# Patient Record
Sex: Female | Born: 1970 | Race: Black or African American | Hispanic: No | Marital: Single | Smoking: Never smoker
Health system: Southern US, Community
[De-identification: ages and names within clinical notes are randomized; demographics above are authoritative.]

## PROBLEM LIST (undated history)

## (undated) DIAGNOSIS — M069 Rheumatoid arthritis, unspecified: Secondary | ICD-10-CM

## (undated) DIAGNOSIS — N760 Acute vaginitis: Secondary | ICD-10-CM

## (undated) DIAGNOSIS — Z975 Presence of (intrauterine) contraceptive device: Secondary | ICD-10-CM

## (undated) DIAGNOSIS — E669 Obesity, unspecified: Secondary | ICD-10-CM

## (undated) DIAGNOSIS — M1990 Unspecified osteoarthritis, unspecified site: Secondary | ICD-10-CM

## (undated) DIAGNOSIS — B009 Herpesviral infection, unspecified: Secondary | ICD-10-CM

## (undated) DIAGNOSIS — B9689 Other specified bacterial agents as the cause of diseases classified elsewhere: Secondary | ICD-10-CM

## (undated) DIAGNOSIS — K5900 Constipation, unspecified: Secondary | ICD-10-CM

## (undated) DIAGNOSIS — I10 Essential (primary) hypertension: Secondary | ICD-10-CM

## (undated) DIAGNOSIS — A599 Trichomoniasis, unspecified: Secondary | ICD-10-CM

## (undated) DIAGNOSIS — R253 Fasciculation: Secondary | ICD-10-CM

## (undated) HISTORY — DX: Essential (primary) hypertension: I10

## (undated) HISTORY — DX: Fasciculation: R25.3

## (undated) HISTORY — PX: TONSILLECTOMY: SUR1361

## (undated) HISTORY — DX: Unspecified osteoarthritis, unspecified site: M19.90

## (undated) HISTORY — DX: Obesity, unspecified: E66.9

## (undated) HISTORY — DX: Other specified bacterial agents as the cause of diseases classified elsewhere: B96.89

## (undated) HISTORY — DX: Herpesviral infection, unspecified: B00.9

## (undated) HISTORY — PX: CHOLECYSTECTOMY: SHX55

## (undated) HISTORY — DX: Acute vaginitis: N76.0

## (undated) HISTORY — PX: APPENDECTOMY: SHX54

## (undated) HISTORY — DX: Trichomoniasis, unspecified: A59.9

## (undated) HISTORY — PX: HAND SURGERY: SHX662

## (undated) HISTORY — DX: Presence of (intrauterine) contraceptive device: Z97.5

## (undated) HISTORY — DX: Constipation, unspecified: K59.00

## (undated) HISTORY — DX: Rheumatoid arthritis, unspecified: M06.9

---

## 2000-05-21 ENCOUNTER — Emergency Department (HOSPITAL_COMMUNITY): Admission: EM | Admit: 2000-05-21 | Discharge: 2000-05-21 | Payer: Self-pay | Admitting: Emergency Medicine

## 2002-01-03 ENCOUNTER — Emergency Department (HOSPITAL_COMMUNITY): Admission: EM | Admit: 2002-01-03 | Discharge: 2002-01-03 | Payer: Self-pay | Admitting: *Deleted

## 2002-01-03 ENCOUNTER — Encounter: Payer: Self-pay | Admitting: *Deleted

## 2002-07-03 ENCOUNTER — Inpatient Hospital Stay (HOSPITAL_COMMUNITY): Admission: EM | Admit: 2002-07-03 | Discharge: 2002-07-05 | Payer: Self-pay | Admitting: Emergency Medicine

## 2002-07-03 ENCOUNTER — Encounter: Payer: Self-pay | Admitting: Emergency Medicine

## 2002-12-20 ENCOUNTER — Emergency Department (HOSPITAL_COMMUNITY): Admission: EM | Admit: 2002-12-20 | Discharge: 2002-12-21 | Payer: Self-pay | Admitting: Emergency Medicine

## 2003-05-22 ENCOUNTER — Ambulatory Visit (HOSPITAL_COMMUNITY): Admission: RE | Admit: 2003-05-22 | Discharge: 2003-05-22 | Payer: Self-pay | Admitting: Internal Medicine

## 2003-05-22 ENCOUNTER — Encounter: Payer: Self-pay | Admitting: Internal Medicine

## 2003-08-27 ENCOUNTER — Emergency Department (HOSPITAL_COMMUNITY): Admission: AD | Admit: 2003-08-27 | Discharge: 2003-08-27 | Payer: Self-pay | Admitting: Family Medicine

## 2003-10-02 ENCOUNTER — Emergency Department (HOSPITAL_COMMUNITY): Admission: AD | Admit: 2003-10-02 | Discharge: 2003-10-02 | Payer: Self-pay | Admitting: Family Medicine

## 2003-10-09 ENCOUNTER — Emergency Department (HOSPITAL_COMMUNITY): Admission: AD | Admit: 2003-10-09 | Discharge: 2003-10-09 | Payer: Self-pay | Admitting: Family Medicine

## 2005-02-09 ENCOUNTER — Ambulatory Visit (HOSPITAL_COMMUNITY): Admission: RE | Admit: 2005-02-09 | Discharge: 2005-02-09 | Payer: Self-pay | Admitting: Internal Medicine

## 2005-08-11 ENCOUNTER — Ambulatory Visit (HOSPITAL_COMMUNITY): Admission: RE | Admit: 2005-08-11 | Discharge: 2005-08-11 | Payer: Self-pay | Admitting: Internal Medicine

## 2006-03-26 ENCOUNTER — Emergency Department (HOSPITAL_COMMUNITY): Admission: EM | Admit: 2006-03-26 | Discharge: 2006-03-26 | Payer: Self-pay | Admitting: Emergency Medicine

## 2006-03-31 ENCOUNTER — Ambulatory Visit: Payer: Self-pay | Admitting: Orthopedic Surgery

## 2006-04-08 ENCOUNTER — Emergency Department (HOSPITAL_COMMUNITY): Admission: EM | Admit: 2006-04-08 | Discharge: 2006-04-08 | Payer: Self-pay | Admitting: Family Medicine

## 2006-04-29 ENCOUNTER — Emergency Department (HOSPITAL_COMMUNITY): Admission: EM | Admit: 2006-04-29 | Discharge: 2006-04-29 | Payer: Self-pay | Admitting: Emergency Medicine

## 2006-06-03 ENCOUNTER — Ambulatory Visit (HOSPITAL_COMMUNITY): Admission: RE | Admit: 2006-06-03 | Discharge: 2006-06-03 | Payer: Self-pay | Admitting: Obstetrics and Gynecology

## 2006-08-16 ENCOUNTER — Ambulatory Visit (HOSPITAL_COMMUNITY): Admission: RE | Admit: 2006-08-16 | Discharge: 2006-08-16 | Payer: Self-pay | Admitting: Obstetrics and Gynecology

## 2006-08-16 ENCOUNTER — Encounter (INDEPENDENT_AMBULATORY_CARE_PROVIDER_SITE_OTHER): Payer: Self-pay | Admitting: *Deleted

## 2007-02-09 ENCOUNTER — Ambulatory Visit (HOSPITAL_BASED_OUTPATIENT_CLINIC_OR_DEPARTMENT_OTHER): Admission: RE | Admit: 2007-02-09 | Discharge: 2007-02-09 | Payer: Self-pay | Admitting: Orthopedic Surgery

## 2007-08-06 ENCOUNTER — Inpatient Hospital Stay (HOSPITAL_COMMUNITY): Admission: AD | Admit: 2007-08-06 | Discharge: 2007-08-07 | Payer: Self-pay | Admitting: Obstetrics and Gynecology

## 2007-08-17 HISTORY — PX: OTHER SURGICAL HISTORY: SHX169

## 2007-11-28 ENCOUNTER — Inpatient Hospital Stay (HOSPITAL_COMMUNITY): Admission: AD | Admit: 2007-11-28 | Discharge: 2007-12-02 | Payer: Self-pay | Admitting: Obstetrics and Gynecology

## 2008-06-17 ENCOUNTER — Encounter: Admission: RE | Admit: 2008-06-17 | Discharge: 2008-06-17 | Payer: Self-pay | Admitting: Rheumatology

## 2008-10-24 ENCOUNTER — Ambulatory Visit (HOSPITAL_COMMUNITY): Admission: RE | Admit: 2008-10-24 | Discharge: 2008-10-24 | Payer: Self-pay | Admitting: Internal Medicine

## 2009-11-28 ENCOUNTER — Other Ambulatory Visit: Admission: RE | Admit: 2009-11-28 | Discharge: 2009-11-28 | Payer: Self-pay | Admitting: Obstetrics and Gynecology

## 2010-12-13 ENCOUNTER — Encounter: Payer: Self-pay | Admitting: Internal Medicine

## 2011-01-27 ENCOUNTER — Ambulatory Visit (INDEPENDENT_AMBULATORY_CARE_PROVIDER_SITE_OTHER): Payer: Private Health Insurance - Indemnity

## 2011-01-27 ENCOUNTER — Inpatient Hospital Stay (INDEPENDENT_AMBULATORY_CARE_PROVIDER_SITE_OTHER)
Admission: RE | Admit: 2011-01-27 | Discharge: 2011-01-27 | Disposition: A | Discharge status: Home / Self Care | Payer: Private Health Insurance - Indemnity | Source: Ambulatory Visit | Attending: Emergency Medicine | Admitting: Emergency Medicine

## 2011-01-27 DIAGNOSIS — M659 Synovitis and tenosynovitis, unspecified: Secondary | ICD-10-CM

## 2011-04-06 NOTE — Op Note (Signed)
[name], [name]            ACCOUNT NO.:  [id number]   MEDICAL RECORD NO.:  [id number]          PATIENT TYPE:  INP   LOCATION:  9167                          FACILITY:  [facility]   PHYSICIAN:  Janine Limbo, M.D.DATE OF BIRTH:  [birth year]   DATE OF PROCEDURE:  11/30/2007  DATE OF DISCHARGE:                               OPERATIVE REPORT   PREOPERATIVE DIAGNOSES:  1. Postpartum day zero.  2. Postpartum hemorrhage.  3. Anemia (hemoglobin 10.9).   POSTOPERATIVE DIAGNOSES:  1. Postpartum day zero.  2. Postpartum hemorrhage.  3. Anemia (hemoglobin 10.9).   PROCEDURE:  1. Examination under anesthesia.  2. Uterine exploration  3. Uterine curettage.   SURGEON:  Dr. Leonard Schwartz.   FIRST ASSISTANT:  None.   ANESTHETIC:  Epidural.   DISPOSITION:  [name] is a 40 year old female, now para 1-0-1-1, who  had a vaginal delivery of a healthy female infant at approximately 3  o'clock a.m. this morning.  The patient's labor was uneventful.  Her son  weighed 8 pounds.  The Apgars were 7 at one minute and 9 at five  minutes.  There was a three-vessel umbilical cord present.  The placenta  appeared completely normal.  There was no evidence of retained products  of conception.  There was no evidence of lacerations that delivery.  However after delivery, the patient was noted to have a postpartum  hemorrhage.  The estimated blood loss during the third stage of labor  was 600 mL.  The patient was given Pitocin through her IV line.  She was  given 1000 mcg of Cytotec in the rectum.  She was also given a total of  three doses of Hemabate (a total of 750 mcg).  She continued to have a  steady trickle of blood from the vagina.  The decision was made to  explore the uterus to look for unsuspected lacerations and for retained  products of conception.  The patient understands the indications for her  surgical procedure and she accepts the risks of, but not limited to,  anesthetic  complications, bleeding, infection, and possible damage to  the surrounding organs.   FINDINGS:  The vagina was carefully inspected and there were no lower or  upper vaginal lacerations.  The cervix was carefully inspected and there  were no cervical lacerations.  The uterus was explored with a banjo  curette and ring forceps and there was no evidence of retained products  of conception.  There were no abnormal intrauterine masses appreciated.   PROCEDURE:  The patient was placed in a lithotomy position.  We were  then able to determine that the epidural she had received for labor  would be adequate for her anesthesia for this procedure.  The perineum  and vagina were prepped with multiple layers of Betadine.  A Foley  catheter had previously been placed.  A sterile speculum was placed in  the vagina and the vagina was carefully inspected.  No lacerations were  noted.  The cervix was carefully inspected and no lacerations were  noted.  A banjo curette was then used to gently curette  the intrauterine  cavity.  No retained products of conception were appreciated.  No  abnormal intrauterine masses were appreciated.  The cavity was then  explored using ring forceps and again no masses and no retained products  of conception were noted.  The patient was noted to have small to  moderate lochia at that point.  The estimated blood loss for the  procedure was 150 mL.  She tolerated her procedure well.  She was  returned to the supine position.   We will watch the patient carefully for several hours.  She will not eat  or drink anything at this point.  She understands that if she continues  to have a significant amount of bleeding, then we will consider a Bakri  balloon placement.      Janine Limbo, M.D.  Electronically Signed     AVS/MEDQ  D:  11/30/2007  T:  11/30/2007  Job:  [id number]

## 2011-04-06 NOTE — H&P (Signed)
NAMEJOSSILYN, [name]            ACCOUNT NO.:  [id number]   MEDICAL RECORD NO.:  [id number]          PATIENT TYPE:  INP   LOCATION:  9167                          [facility]ACILITY:  [facility]   PHYSICIAN:  Janine Limbo, M.D.DATE OF BIRTH:  January 02, 1971   DATE OF ADMISSION:  11/28/2007  DATE OF DISCHARGE:                              HISTORY & PHYSICAL   HISTORY OF PRESENT ILLNESS:  Ms. [name] is a 40 year old female,  gravida 2, para 0-0-1-0, who presents at 39 weeks and 1 day gestation  for induction of labor.  The patient has been followed at the [facility].  Her pregnancy has been complicated by the fact that she has  rheumatoid arthritis.  She currently takes hydroxychloroquine 200 mg by  mouth twice each day.  The patient also has a history of herpes simplex  virus and she takes Valtrex 500 mg each day.  She denies any signs or  symptoms consistent with a herpes outbreak.  The patient has also been  noted to have proteinuria as measured by her 24-hour urine protein.  Her  platelet count was within normal limits.  Her comprehensive metabolic  panel was within normal limits.   OBSTETRICAL HISTORY:  The patient has had one elective pregnancy  termination.   PAST MEDICAL HISTORY:  The patient has a history of rheumatoid arthritis  as mentioned above.  She has had a hysteroscopy in the past with  resection of submucosal fibroid.  The patient does have a past history  of infertility.  She has taken Clomid.   DRUG ALLERGIES:  The patient is not allergic to any medications,  although she does have allergies to CERTAIN DYES and also an allergy to  POLLEN.   SOCIAL HISTORY:  The patient denies cigarette use, alcohol use, and  recreational drug use.   REVIEW OF SYSTEMS:  Normal pregnancy complaints.   FAMILY HISTORY:  Noncontributory.   PHYSICAL EXAM:  The patient is afebrile.  Her vital signs are stable.  Her  weight is approximately 238 pounds.  HEENT: Is within normal limits.  CHEST  Is clear.  HEART:  Regular rate and rhythm.  BREASTS:  Are without masses.  ABDOMEN:  Is gravid with a fundal height of approximately 37 cm.  EXTREMITIES:  Are grossly normal.  NEUROLOGIC EXAM:  Is grossly normal.  PELVIC EXAM:  Cervix was 1-2 cm dilated, 50% effaced, and -3 station  upon admission.  On careful speculum exam, there were no signs or  lesions consistent with herpes virus.   LABORATORY VALUES:  Blood type is O+, antibody screen negative, VDRL  nonreactive, rubella immune, HBsAg negative,  HIV nonreactive, GC  negative, chlamydia negative.  Pap within normal limits.  First  trimester screen is within normal limits.  Amniocentesis declined.   ASSESSMENT:  1. 39 weeks and 1 day gestation.  2. Rheumatoid arthritis.  3. Proteinuria.  4. Herpes simplex virus with no evidence of an outbreak (currently on      Valtrex).  5. History of infertility with conception on Clomid.  PLAN:  The patient will have her labor induced.  We will begin with  Cervidil on the night of admission and then Pitocin on the following  morning.  The patient understands the indications for her procedure and  she understands that there is an increased risk of cesarean delivery  associated with induction of labor.      Janine Limbo, M.D.  Electronically Signed     AVS/MEDQ  D:  11/29/2007  T:  11/29/2007  Job:  [id number]

## 2011-04-09 NOTE — Op Note (Signed)
[name], [name]            ACCOUNT NO.:  [id number]   MEDICAL RECORD NO.:  [id number]          PATIENT TYPE:  AMB   LOCATION:  SDC                           FACILITY:  [facility]   PHYSICIAN:  Janine Limbo, M.D.DATE OF BIRTH:  Jul 17, 1971   DATE OF PROCEDURE:  08/16/2006  DATE OF DISCHARGE:  08/16/2006                                 OPERATIVE REPORT   PREOPERATIVE DIAGNOSES:  1. Irregular bleeding.  2. Submucosal fibroids.   POSTOPERATIVE DIAGNOSES:  1. Irregular bleeding.  2. Endometrial polyp.   PROCEDURE:  1. Hysteroscopy with resection.  2. Dilatation and curettage.   SURGEON:  Dr. Leonard Schwartz   FIRST ASSISTANT:  None.   ANESTHETIC:  General.   DISPOSITION:  Kendra [name] is a 40 year old female who presents with the  above-mentioned diagnosis.  She has a history of infertility.  Hysterosalpingogram showed patent fallopian tubes.  She was found to have  what was thought to be a submucosal fibroid.  The patient understands the  indications for her surgical procedure and she accepts the risks of, but not  limited to, anesthetic complications, bleeding, infection, and possible  damage to surrounding organs.   FINDINGS:  The uterus was normal size, shape and consistency.  No adnexal  masses were appreciated.  On hysteroscopy, the uterus was sounded to 8 cm.  The patient was found to have a 1 cm endometrial polyp.  No submucosal  fibroids were noted.  There was no evidence of other pathology present.   PROCEDURE:  The patient was taken to the operating room where general  anesthetic was given.  The patient's abdomen, perineum, and vagina were  prepped with multiple layers of Hibiclens.  The bladder was drained of  urine.  The patient was then sterilely draped.  Examination under anesthesia  was then performed.  A paracervical block was placed using 10 mL of half  percent Marcaine with epinephrine.  An additional 10 mL of half percent  Marcaine with  epinephrine were injected directly into the cervix.  The  uterus sounded to 8 cm.  The cervix was gently dilated.  The diagnostic  hysteroscope was inserted and the cavity was carefully inspected.  Pictures  were taken.  Findings were as mentioned above.  The diagnostic hysteroscope  was then removed and the cervix was dilated further.  The operative  hysteroscope was then inserted.  Using a single loop, we then resected the  polyp that was mentioned previously.  The cavity was inspected and no other  polyps were noted and no other fibroids were noted.  We then gently curetted  the uterus so that we removed all tissue fragments.  The hysteroscope was  inserted once again and the cavity was irrigated.  The cavity was felt to be  clean at the end of our procedure.  The patient tolerated her procedure  well.  Hemostasis was noted to be adequate.  All instruments were removed.  Repeat examination showed the uterus to be firm.  Sponge, needle, instrument  counts were correct on two occasions.  The estimated blood loss for the  procedure was  10 mL.  The estimated fluid deficit for the procedure was 50  mL of sorbitol.  The patient was awakened without difficulty and then taken  to the recovery room in stable condition.  She tolerated her procedure well.  The endometrial curettings and the endometrial resection specimen were sent  to pathology for evaluation.   FOLLOW-UP INSTRUCTIONS:  The patient will take ibuprofen 600 mg every 6  hours as needed for mild to moderate pain.  She will take Vicodin 1 or 2  tablets every 4 hours as needed for severe pain.  She will return to see Dr.  Stefano [name] in 2-3 weeks for follow-up examination.  She was given a copy of  the postoperative instruction sheet as prepared by the [facility]  [facility] for patients who have undergone a dilatation and curettage.      Janine Limbo, M.D.  Electronically Signed     AVS/MEDQ  D:  08/16/2006  T:   08/18/2006  Job:  [id number]

## 2011-04-09 NOTE — Discharge Summary (Signed)
[name], [name]                        ACCOUNT NO.:  [id number]   MEDICAL RECORD NO.:  [id number]                   PATIENT TYPE:  INP   LOCATION:  A332                                 FACILITY:  [facility]   PHYSICIAN:  Gracelyn Nurse, M.D.              DATE OF BIRTH:  [birth year]   DATE OF ADMISSION:  07/03/2002  DATE OF DISCHARGE:  07/05/2002                                 DISCHARGE SUMMARY   DISCHARGE DIAGNOSES:  1. Sepsis.     a. With hypotension and fever.  2. Urinary tract infection.  3. Headache.  4. Status post cholecystectomy.  5. Status post appendectomy.  6. Chronic constipation.   DISCHARGE MEDICATIONS:  1. Levaquin 500 mg q.d. x7 days.  2. MiraLax 30 g q.d.   ADMITTING HISTORY AND PHYSICAL:  The patient is a 40 year old black female  who presents to the ED with chills and fever and also hypotensive.  She was  seen in an Urgent Care Center yesterday, diagnosed with UTI and given  Bactrim, she has only taken one dose so far.  She became acutely worse  overnight with fever, chills, and weakness.   HOSPITAL COURSE:  1. Sepsis.  Upon arrival to the ED, the patient had a fever of 102.6, blood     pressure 76/57.  She was fluid resuscitated in the ER and blood pressure     rose to systolic over 100, tachycardia subsided some.  She was treated     with antibiotics for her urinary tract infection and sepsis seemed     resolved.  2. Urinary tract infection.  She did have white blood cells in her urine and     nitrite positive.  Cultures did not grow out anything so far.  She was     started on IV Rocephin.  Day of discharge, she was changed to p.o.     Levaquin and will finish a 7-day course of the p.o. Levaquin.  She is to     follow up with her primary care physician.  3. Chronic constipation.  This has been an ongoing problem for her.  She     takes a daily laxative.  She is being seen by Dr. Matthias Hughs, a     gastroenterologist in [place] for workup of this  and will continue to     see him.   DISCHARGE LABORATORIES:  White blood cells 5.3, hemoglobin 11.9, platelets  219.  Sodium 140, potassium 3.9, chloride 111, CO2 25, BUN 2, creatinine  0.7, glucose 96.    DISPOSITION:  The patient is discharged in stable condition.  She wants to  establish Dr. Felecia Shelling as her new primary care physician and I have called the  office and scheduled an appointment for July 16, 2002 for followup.  Gracelyn Nurse, M.D.    JDJ/MEDQ  D:  07/05/2002  T:  07/09/2002  Job:  [id number]   cc:   Tesfaye D. Felecia Shelling, M.D.  Fax [phone]

## 2011-04-09 NOTE — Op Note (Signed)
NAMEABIGAELLE, [name]            ACCOUNT NO.:  [id number]   MEDICAL RECORD NO.:  [id number]          PATIENT TYPE:  AMB   LOCATION:  DSC                          FACILITY:  [facility]   PHYSICIAN:  Katy Fitch. Sypher, M.D. 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   OPERATIONS:  Release of left first dorsal compartment.   OPERATIONS:  Katy Fitch. Sypher, M.D.   ASSISTANT:  Molly Maduro Dasnoit PA-C.   ANESTHESIA:  General by LMA.   SUPERVISING ANESTHESIOLOGIST:  Sheldon Silvan, M.D.   INDICATIONS:  [name] is a 35-year woman referred by Dr. Felecia Shelling  for evaluation and management of a painful left wrist.  Clinical  examination 6 months prior revealed signs of stenosing tenosynovitis of  the left first dorsal compartment.   She has not responded to injections, splinting and activity  modification.  Due to chronic pain, she is brought to the operating room  at this time for release of her left first dorsal compartment.   PROCEDURE:  [name] is brought to the operating room and placed  in the supine position upon the operating table.   Following induction of general anesthesia by LMA technique, the left arm  was prepped with Betadine soap and solution and  sterilely draped.  A  pneumatic tourniquet was applied to the proximal left brachium.   Following exsanguination of the left arm with an Esmarch bandage, the  arterial tourniquet was inflated to 220 mmHg.  The procedure commenced  with a short transverse incision directly over the palpably thickened  first dorsal compartment.   Subcutaneous tissue were carefully divided, taking care to identify the  cephalic vein and radial sensory branches.  These were  gently retracted,  exposing the first dorsal compartment.  The compartment was cleared of  inflammatory tissues with the [facility] followed by placement of blunt  retractors.  The compartment was then split with a scalpel and scissors.  A significant septum was identified between the extensor pollicis brevis  and abductor pollicis longus tendons.  This was resected with sharp  dissection and use of a rongeur.   Thereafter free range of motion of the wrist was recovered.  The wound  was then repaired with intradermal 3-0 Prolene suture.  A Steri-Strip  was applied followed by sterile gauze and an Ace wrap.  There were no  apparent complications.      Katy Fitch Sypher, M.D.  Electronically Signed     RVS/MEDQ  D:  02/09/2007  T:  02/09/2007  Job:  [id number]

## 2011-04-09 NOTE — H&P (Signed)
[name], [name]            ACCOUNT NO.:  [id number]   MEDICAL RECORD NO.:  [id number]          PATIENT TYPE:  AMB   LOCATION:  SDC                           FACILITY:  [facility]   PHYSICIAN:  Janine Limbo, M.D.DATE OF BIRTH:  05/22/1971   DATE OF ADMISSION:  08/16/2006  DATE OF DISCHARGE:                                HISTORY & PHYSICAL   HISTORY OF PRESENT ILLNESS:  Ms. Childers is a 40 year old female, para 0-0-1-  0, who presents for hysteroscopy with dilatation and curettage and resection  of a submucosal fibroid.  The patient has a history of infertility.  She had  a hysterosalpingogram performed that showed patent fallopian tubes  bilaterally.  She was also found to have two submucosal fibroids.   OBSTETRICAL HISTORY:  The patient has had one first trimester pregnancy  termination.  The patient has a history of sexually transmitted diseases in  the past.   PAST MEDICAL HISTORY:  The patient has a history of high blood pressure.  She has had her wisdom teeth removed, appendix removed, tonsils removed, and  gallbladder removed.  She has had surgery on her hand.   ALLERGIES:  IV DYE.   SOCIAL HISTORY:  The patient denies cigarette use and recreational drug use  other than social alcohol.   REVIEW OF SYSTEMS:  Noncontributory.   FAMILY HISTORY:  Noncontributory.   PHYSICAL EXAMINATION:  VITAL SIGNS:  Weight 208 pounds.  HEENT:  Within normal limits.  CHEST:  Clear.  HEART:  Regular rate and rhythm.  BREASTS:  Without masses.  ABDOMEN:  Nontender.  EXTREMITIES:  Grossly normal.  NEUROLOGICAL:  Grossly normal.  PELVIC EXAM:  External genitalia is normal.  The vagina is normal.  The  cervix is nontender.  The uterus is normal size, shape, and consistency.  Adnexa no masses.  Rectovaginal exam confirms.   ASSESSMENT:  1. Submucosal fibroids.  2. Irregular cycles.  3. Infertility.   PLAN:  The patient will undergo hysteroscopy with resection of the  submucosal  fibroid.  The patient understands the indications for her  surgical procedure and she accepts the risks of, but not limited to,  anesthetic complications, bleeding, infection, and possible damage to  surrounding organs.      Janine Limbo, M.D.  Electronically Signed     AVS/MEDQ  D:  08/15/2006  T:  08/16/2006  Job:  [id number]

## 2011-08-12 LAB — DIFFERENTIAL
Basophils Absolute: 0
Eosinophils Absolute: 0
Lymphocytes Relative: 3 — ABNORMAL LOW
Lymphs Abs: 0.7
Monocytes Absolute: 1.7 — ABNORMAL HIGH
Monocytes Relative: 7
Neutrophils Relative %: 90 — ABNORMAL HIGH

## 2011-08-12 LAB — PROTIME-INR: INR: 1.1

## 2011-08-12 LAB — CBC
HCT: 22.4 — ABNORMAL LOW
Hemoglobin: 13.9
Hemoglobin: 7.6 — CL
Hemoglobin: 9.4 — ABNORMAL LOW
MCHC: 34.8
MCV: 95.3
Platelets: 187
Platelets: 212
RBC: 2.35 — ABNORMAL LOW
RBC: 2.89 — ABNORMAL LOW
RBC: 3.36 — ABNORMAL LOW
RBC: 4.36
RDW: 14.4
RDW: 15.1
WBC: 12.1 — ABNORMAL HIGH
WBC: 19.9 — ABNORMAL HIGH
WBC: 24.9 — ABNORMAL HIGH

## 2011-08-12 LAB — TYPE AND SCREEN
ABO/RH(D): O POS
Antibody Screen: NEGATIVE

## 2011-08-12 LAB — APTT: aPTT: 25

## 2011-08-12 LAB — ABO/RH: ABO/RH(D): O POS

## 2011-08-12 LAB — TECHNOLOGIST SMEAR REVIEW: Path Review: NONE SEEN

## 2011-08-12 LAB — RPR: RPR Ser Ql: NONREACTIVE

## 2011-09-03 LAB — URINE MICROSCOPIC-ADD ON

## 2011-09-03 LAB — GC/CHLAMYDIA PROBE AMP, GENITAL
Chlamydia, DNA Probe: NEGATIVE
GC Probe Amp, Genital: NEGATIVE

## 2011-09-03 LAB — URINALYSIS, ROUTINE W REFLEX MICROSCOPIC: Nitrite: NEGATIVE

## 2011-10-20 ENCOUNTER — Other Ambulatory Visit: Payer: Self-pay | Admitting: Adult Health

## 2011-10-20 ENCOUNTER — Other Ambulatory Visit (HOSPITAL_COMMUNITY)
Admission: RE | Admit: 2011-10-20 | Discharge: 2011-10-20 | Disposition: A | Discharge status: Home / Self Care | Payer: Private Health Insurance - Indemnity | Source: Ambulatory Visit | Attending: Obstetrics and Gynecology | Admitting: Obstetrics and Gynecology

## 2011-10-20 DIAGNOSIS — Z01419 Encounter for gynecological examination (general) (routine) without abnormal findings: Secondary | ICD-10-CM | POA: Insufficient documentation

## 2012-10-19 ENCOUNTER — Emergency Department (HOSPITAL_COMMUNITY)
Admission: EM | Admit: 2012-10-19 | Discharge: 2012-10-19 | Disposition: A | Discharge status: Home / Self Care | Payer: Private Health Insurance - Indemnity | Attending: Emergency Medicine | Admitting: Emergency Medicine

## 2012-10-19 ENCOUNTER — Encounter (HOSPITAL_COMMUNITY): Payer: Self-pay | Admitting: *Deleted

## 2012-10-19 ENCOUNTER — Emergency Department (HOSPITAL_COMMUNITY): Payer: Private Health Insurance - Indemnity

## 2012-10-19 DIAGNOSIS — K5900 Constipation, unspecified: Secondary | ICD-10-CM

## 2012-10-19 DIAGNOSIS — Z8739 Personal history of other diseases of the musculoskeletal system and connective tissue: Secondary | ICD-10-CM | POA: Insufficient documentation

## 2012-10-19 DIAGNOSIS — R109 Unspecified abdominal pain: Secondary | ICD-10-CM

## 2012-10-19 DIAGNOSIS — R1013 Epigastric pain: Secondary | ICD-10-CM | POA: Insufficient documentation

## 2012-10-19 HISTORY — DX: Unspecified osteoarthritis, unspecified site: M19.90

## 2012-10-19 LAB — BASIC METABOLIC PANEL
BUN: 11 mg/dL (ref 6–23)
CO2: 25 mEq/L (ref 19–32)
Calcium: 9.1 mg/dL (ref 8.4–10.5)
Creatinine, Ser: 0.9 mg/dL (ref 0.50–1.10)
Glucose, Bld: 103 mg/dL — ABNORMAL HIGH (ref 70–99)

## 2012-10-19 LAB — URINE MICROSCOPIC-ADD ON

## 2012-10-19 LAB — URINALYSIS, ROUTINE W REFLEX MICROSCOPIC
Glucose, UA: NEGATIVE mg/dL
Ketones, ur: 15 mg/dL — AB
Protein, ur: NEGATIVE mg/dL

## 2012-10-19 LAB — HEPATIC FUNCTION PANEL
ALT: 21 U/L (ref 0–35)
Albumin: 3.7 g/dL (ref 3.5–5.2)
Alkaline Phosphatase: 71 U/L (ref 39–117)
Indirect Bilirubin: 0.4 mg/dL (ref 0.3–0.9)
Total Protein: 7.1 g/dL (ref 6.0–8.3)

## 2012-10-19 LAB — CBC WITH DIFFERENTIAL/PLATELET
Eosinophils Relative: 1 % (ref 0–5)
Lymphocytes Relative: 14 % (ref 12–46)
Lymphs Abs: 1.8 10*3/uL (ref 0.7–4.0)
MCV: 93.8 fL (ref 78.0–100.0)
Platelets: 218 10*3/uL (ref 150–400)
RBC: 4 MIL/uL (ref 3.87–5.11)
WBC: 12.6 10*3/uL — ABNORMAL HIGH (ref 4.0–10.5)

## 2012-10-19 MED ORDER — MAGNESIUM HYDROXIDE 400 MG/5ML PO SUSP
30.0000 mL | Freq: Once | ORAL | Status: AC
  Administered 2012-10-19: 30 mL via ORAL
  Filled 2012-10-19: qty 30

## 2012-10-19 MED ORDER — HYDROMORPHONE HCL PF 1 MG/ML IJ SOLN
1.0000 mg | Freq: Once | INTRAMUSCULAR | Status: AC
  Administered 2012-10-19: 1 mg via INTRAMUSCULAR
  Filled 2012-10-19: qty 1

## 2012-10-19 MED ORDER — ONDANSETRON HCL 4 MG PO TABS
4.0000 mg | ORAL_TABLET | Freq: Once | ORAL | Status: AC
  Administered 2012-10-19: 4 mg via ORAL
  Filled 2012-10-19: qty 1

## 2012-10-19 NOTE — ED Notes (Signed)
Pain upper back and around to abd, for 3-4 days, Increased pain with breathing, and movement.  No known injury,  No NVD.  No cough

## 2012-10-19 NOTE — ED Notes (Signed)
Pt with mid back pain, denies any injury, hurts worse with deep breath

## 2012-10-19 NOTE — ED Provider Notes (Signed)
History     CSN: [id number]  Arrival date & time 10/19/12  1759   First MD Initiated Contact with Patient 10/19/12 1823      Chief Complaint  Patient presents with  . Back Pain    (Consider location/radiation/quality/duration/timing/severity/associated sxs/prior treatment) Patient is a 41 y.o. female presenting with back pain. The history is provided by the patient.  Back Pain  This is a new problem. The problem occurs constantly. The problem has not changed since onset.The pain is associated with no known injury. The pain is present in the thoracic spine. The quality of the pain is described as aching. The pain does not radiate. The pain is moderate. Exacerbated by: taking a deep breath. The pain is the same all the time. Pertinent negatives include no chest pain, no abdominal pain, no bowel incontinence, no perianal numbness, no bladder incontinence and no dysuria. Risk factors include obesity.    Past Medical History  Diagnosis Date  . Arthritis     Past Surgical History  Procedure Date  . Appendectomy   . Cholecystectomy   . Hand surgery   . Tonsillectomy     History reviewed. No pertinent family history.  History  Substance Use Topics  . Smoking status: Never Smoker   . Smokeless tobacco: Not on file  . Alcohol Use: Yes     Comment: 1-2 x/year    OB History    Grav Para Term Preterm Abortions TAB SAB Ect Mult Living                  Review of Systems  Constitutional: Negative for activity change.       All ROS Neg except as noted in HPI  HENT: Negative for nosebleeds and neck pain.   Eyes: Negative for photophobia and discharge.  Respiratory: Negative for cough, shortness of breath and wheezing.   Cardiovascular: Negative for chest pain and palpitations.  Gastrointestinal: Negative for abdominal pain, blood in stool and bowel incontinence.  Genitourinary: Negative for bladder incontinence, dysuria, frequency and hematuria.  Musculoskeletal: Positive for  back pain and arthralgias.  Skin: Negative.   Neurological: Negative for dizziness, seizures and speech difficulty.  Psychiatric/Behavioral: Negative for hallucinations and confusion.    Allergies  Contrast media  Home Medications  No current outpatient prescriptions on file.  BP 121/62  Pulse 96  Temp 98.2 F (36.8 C) (Oral)  Resp 18  Ht 5\' 1"  (1.549 m)  Wt 185 lb (83.915 kg)  BMI 34.96 kg/m2  SpO2 100%  Physical Exam  Nursing note and vitals reviewed. Constitutional: She is oriented to person, place, and time. She appears well-developed and well-nourished.  Non-toxic appearance.  HENT:  Head: Normocephalic.  Right Ear: Tympanic membrane and external ear normal.  Left Ear: Tympanic membrane and external ear normal.  Eyes: EOM and lids are normal. Pupils are equal, round, and reactive to light.  Neck: Normal range of motion. Neck supple. Carotid bruit is not present.  Cardiovascular: Normal rate, regular rhythm, normal heart sounds, intact distal pulses and normal pulses.   Pulmonary/Chest: Breath sounds normal. No respiratory distress.       No chest wall tenderness. Deep breathing causes pain in the rib cage.   Abdominal: Soft. Bowel sounds are normal. There is no tenderness. There is no guarding.       Pressing at the epigastric area causes pain to the back.  Musculoskeletal: Normal range of motion.       Change of position causes pain  from the epigastric area to the back.   Lymphadenopathy:       Head (right side): No submandibular adenopathy present.       Head (left side): No submandibular adenopathy present.    She has no cervical adenopathy.  Neurological: She is alert and oriented to person, place, and time. She has normal strength. No cranial nerve deficit or sensory deficit.  Skin: Skin is warm and dry.  Psychiatric: She has a normal mood and affect. Her speech is normal.    ED Course  Procedures (including critical care time)  Labs Reviewed - No data to  display No results found.   No diagnosis found.    MDM  I have reviewed nursing notes, vital signs, and all appropriate lab and imaging results for this patient. Patient reports waking up to a pain in the mid back that radiates from the abdomen to the mid back. The pain is worse when she takes a deep breath. She has not had an injury or trauma reported. There's been no nausea or vomiting. The differential diagnosis at this time includes deep muscle strain, cholecystitis (cholecystectomy in the past), constipation, renal colic, pancreatitis, and ulcer. Well's criteria 0, low risk of PE.  Hepatic panel wnl, basic metabolic panel reveals glucose 103, o/w wnl. Lipase wnl, UA wnl. CBC- wbc 12.6, o/w wnl. Acute Abd reveals gas and stool throughout the colon extending to the distal rectum. No acute changes or cardiopulmonary disease noted. Findings and plan for increased fluids and fiber and use of miralax discussed with the patient. She acknowledges understanding of the instructions.     Kathie Dike, [place] 10/19/12 2017

## 2012-10-20 NOTE — ED Provider Notes (Signed)
Medical screening examination/treatment/procedure(s) were performed by non-physician practitioner and as supervising physician I was immediately available for consultation/collaboration. Devoria Albe, MD, Armando Gang   Ward Givens, MD 10/20/12 [phone]

## 2012-10-31 ENCOUNTER — Other Ambulatory Visit (HOSPITAL_COMMUNITY)
Admission: RE | Admit: 2012-10-31 | Discharge: 2012-10-31 | Disposition: A | Discharge status: Home / Self Care | Payer: Private Health Insurance - Indemnity | Source: Ambulatory Visit | Attending: Obstetrics and Gynecology | Admitting: Obstetrics and Gynecology

## 2012-10-31 ENCOUNTER — Other Ambulatory Visit: Payer: Self-pay | Admitting: Adult Health

## 2012-10-31 DIAGNOSIS — Z01419 Encounter for gynecological examination (general) (routine) without abnormal findings: Secondary | ICD-10-CM | POA: Insufficient documentation

## 2012-10-31 DIAGNOSIS — Z1151 Encounter for screening for human papillomavirus (HPV): Secondary | ICD-10-CM | POA: Insufficient documentation

## 2012-11-16 ENCOUNTER — Other Ambulatory Visit: Payer: Self-pay | Admitting: Adult Health

## 2012-11-16 DIAGNOSIS — Z139 Encounter for screening, unspecified: Secondary | ICD-10-CM

## 2012-11-21 ENCOUNTER — Ambulatory Visit (HOSPITAL_COMMUNITY)
Admission: RE | Admit: 2012-11-21 | Discharge: 2012-11-21 | Disposition: A | Discharge status: Home / Self Care | Payer: Private Health Insurance - Indemnity | Source: Ambulatory Visit | Attending: Adult Health | Admitting: Adult Health

## 2012-11-21 DIAGNOSIS — Z1231 Encounter for screening mammogram for malignant neoplasm of breast: Secondary | ICD-10-CM | POA: Insufficient documentation

## 2012-11-21 DIAGNOSIS — Z139 Encounter for screening, unspecified: Secondary | ICD-10-CM

## 2013-02-20 ENCOUNTER — Encounter: Payer: Self-pay | Admitting: *Deleted

## 2013-02-21 ENCOUNTER — Ambulatory Visit: Payer: Self-pay | Admitting: Obstetrics & Gynecology

## 2013-05-12 IMAGING — MG MM DIGITAL SCREENING BILAT
4 series · 4 of 4 positions shown · non-contrast
Comparison: None.

CLINICAL DATA: Screening.

DIGITAL BILATERAL SCREENING MAMMOGRAM WITH CAD

[L CC]
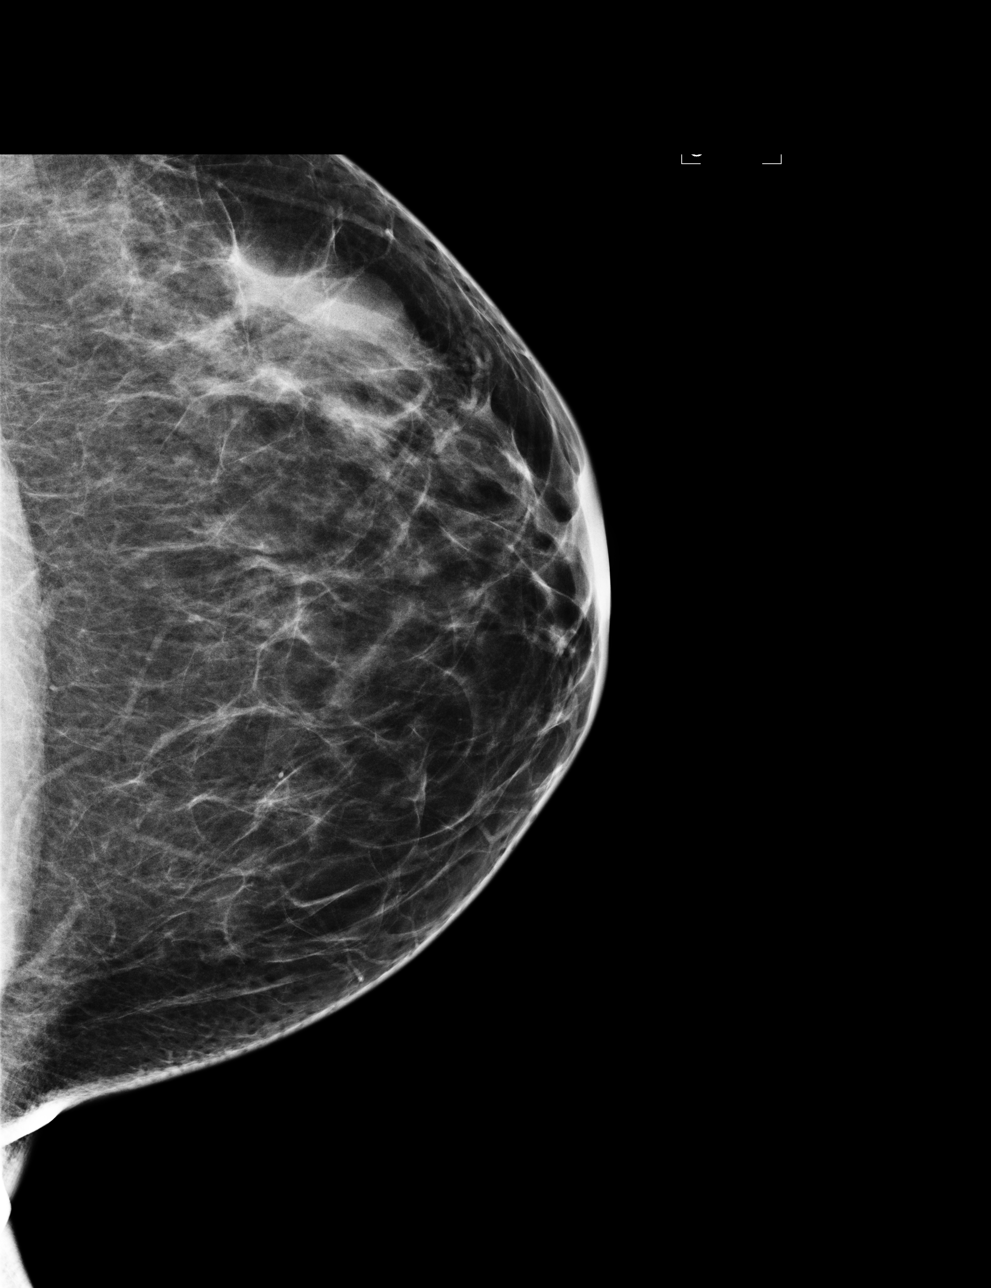

[L MLO]
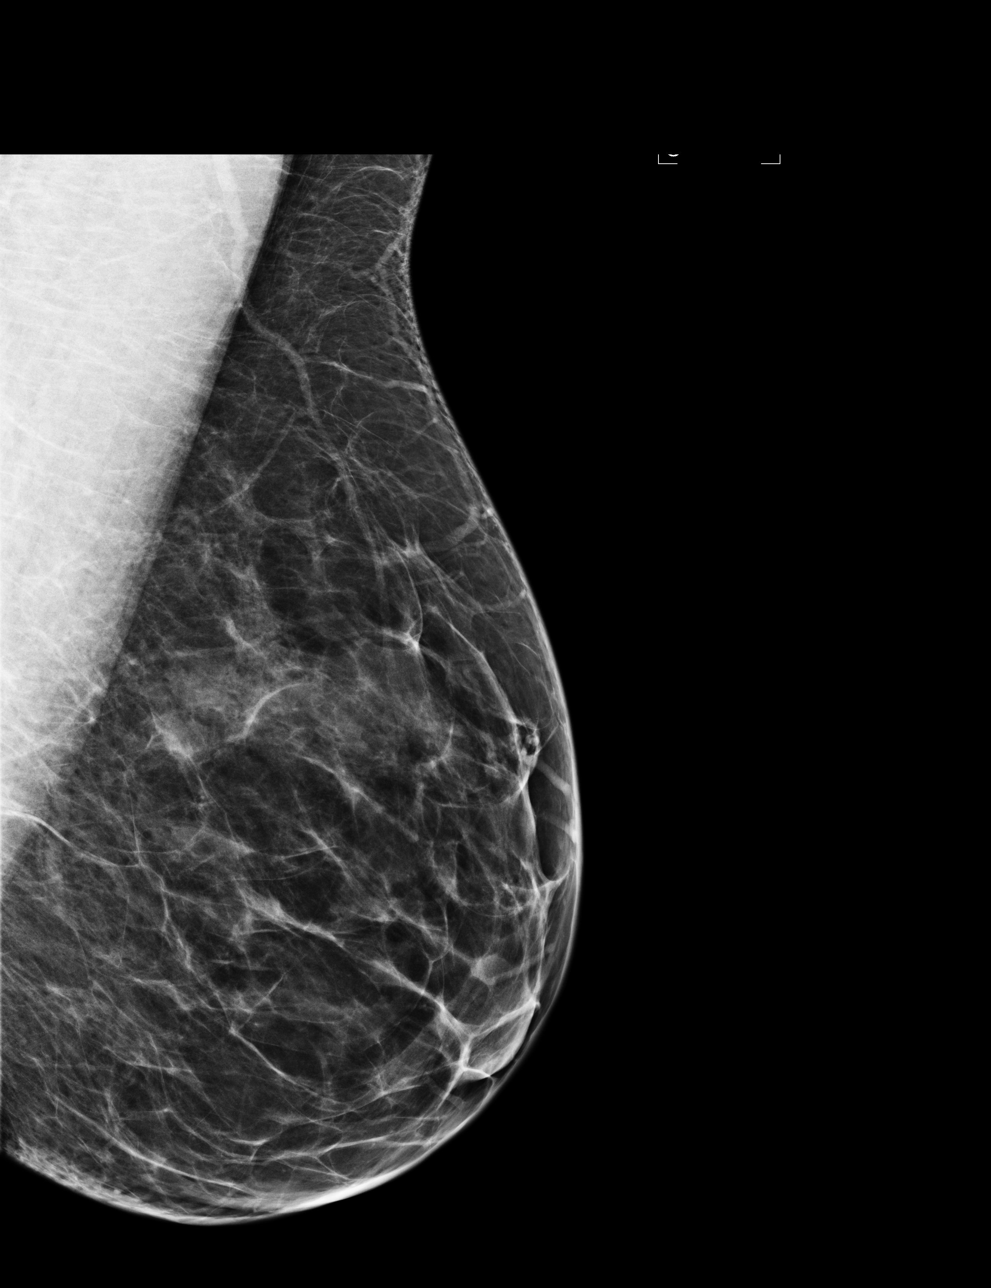

[R CC]
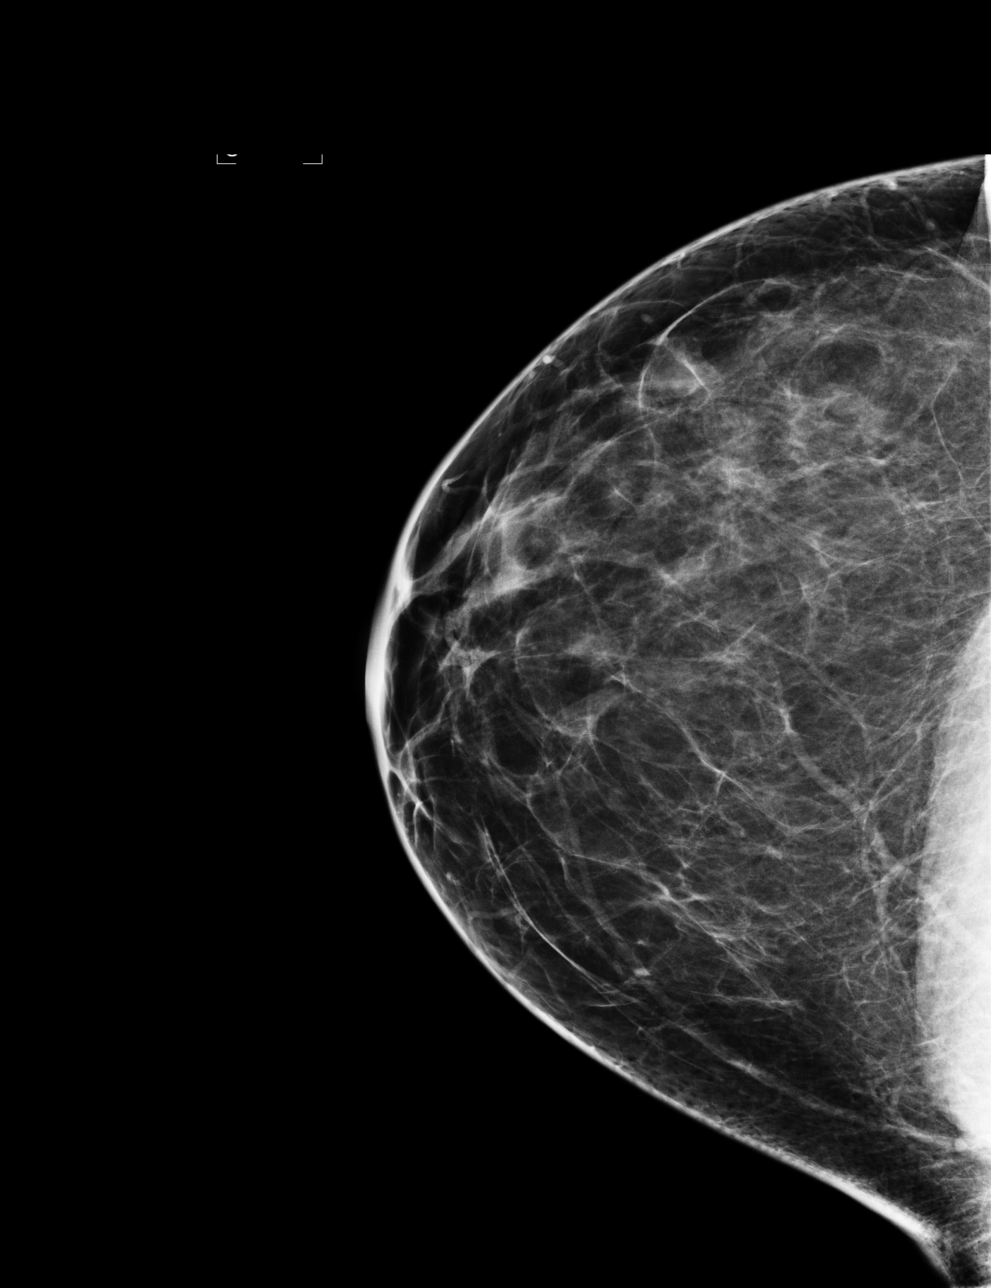

[R MLO]
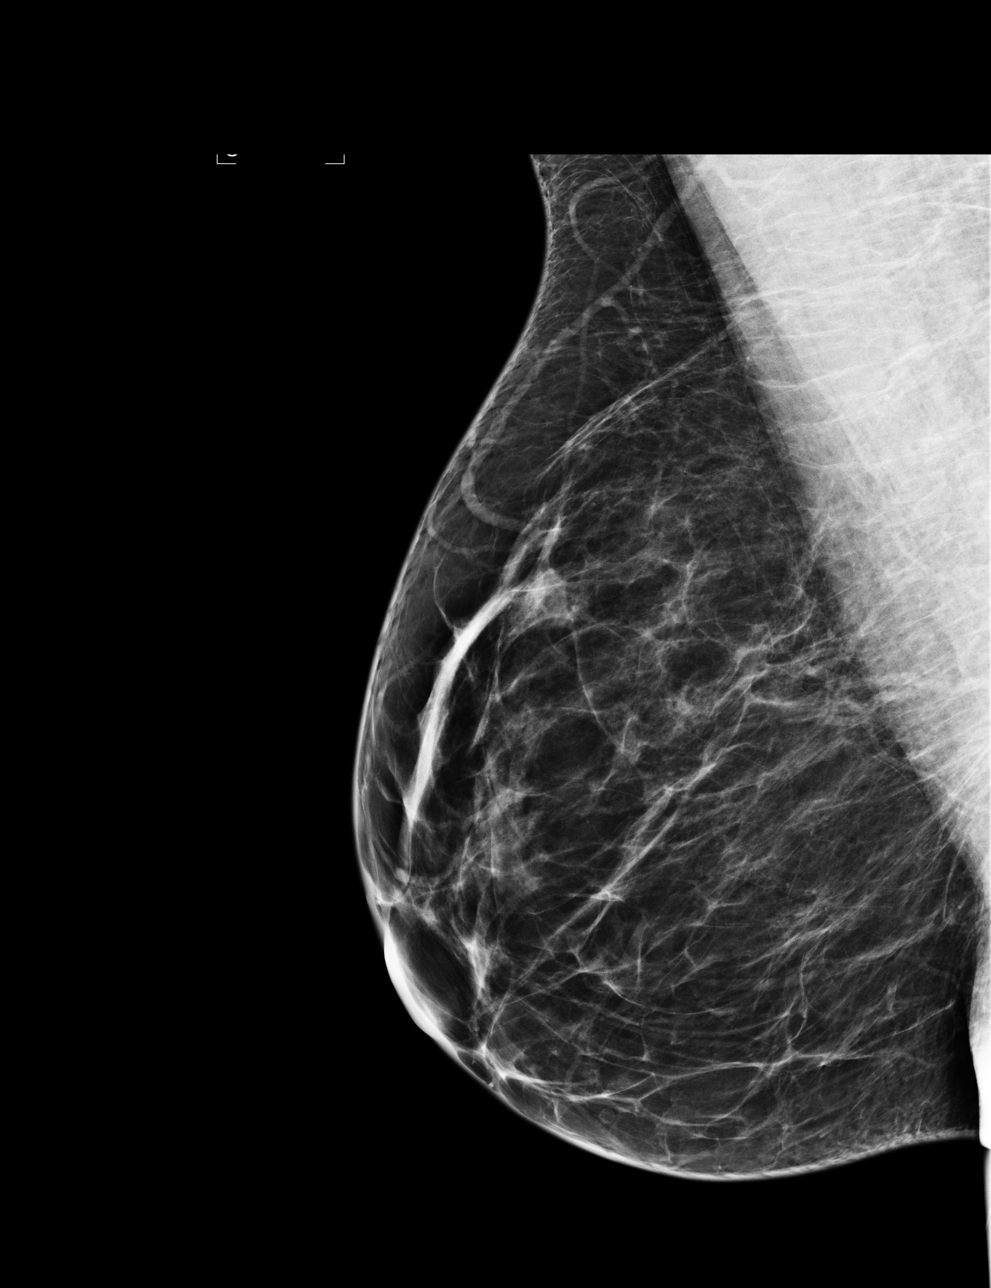

[4 of 4 positions shown; findings below may reference images not displayed]

FINDINGS: ACR Breast Density Category 2: There is a scattered fibroglandular
pattern.

No suspicious masses, architectural distortion, or calcifications
are present.

Images were processed with CAD.
IMPRESSION: No mammographic evidence of malignancy.

A result letter of this screening mammogram will be mailed directly
to the patient.

RECOMMENDATION:
Screening mammogram in one year. (Code:AC-N-PNP)

BI-RADS CATEGORY 1:  Negative.

## 2014-03-20 ENCOUNTER — Encounter: Payer: Self-pay | Admitting: Adult Health

## 2014-03-20 ENCOUNTER — Ambulatory Visit (INDEPENDENT_AMBULATORY_CARE_PROVIDER_SITE_OTHER): Payer: Managed Care, Other (non HMO) | Admitting: Adult Health

## 2014-03-20 VITALS — BP 132/84 | HR 78 | Ht 63.0 in | Wt 198.0 lb

## 2014-03-20 DIAGNOSIS — Z1212 Encounter for screening for malignant neoplasm of rectum: Secondary | ICD-10-CM

## 2014-03-20 DIAGNOSIS — Z975 Presence of (intrauterine) contraceptive device: Secondary | ICD-10-CM

## 2014-03-20 DIAGNOSIS — Z01419 Encounter for gynecological examination (general) (routine) without abnormal findings: Secondary | ICD-10-CM

## 2014-03-20 HISTORY — DX: Presence of (intrauterine) contraceptive device: Z97.5

## 2014-03-20 LAB — HEMOCCULT GUIAC POC 1CARD (OFFICE): Fecal Occult Blood, POC: NEGATIVE

## 2014-03-20 NOTE — Patient Instructions (Signed)
Physical  And pap in 1 year Mammogram  Yearly

## 2014-03-20 NOTE — Progress Notes (Signed)
Patient ID: [name], female   DOB: 1971-09-02, 43 y.o.   MRN: [id number] History of Present Illness: [name] is a 43 year old black female in for a physical,she had a normal pap with negative HPV 10/31/12.Is going to bariatric clinic.    Current Medications, Allergies, Past Medical History, Past Surgical History, Family History and Social History were reviewed in [employer] record.     Review of Systems: Patient denies any headaches, blurred vision, shortness of breath, chest pain, abdominal pain, problems with bowel movements, urination, or intercourse. No joint pain or mood swings.She is thinking about plastic surgery on abdomen,like cool scupturing.    Physical Exam:BP 132/84  Pulse 78  Ht 5\' 3"  (1.6 m)  Wt 198 lb (89.812 kg)  BMI 35.08 kg/m2 General:  Well developed, well nourished, no acute distress Skin:  Warm and dry Neck:  Midline trachea, normal thyroid Lungs; Clear to auscultation bilaterally Breast:  No dominant palpable mass, retraction, or nipple discharge Cardiovascular: Regular rate and rhythm Abdomen:  Soft, non tender, no hepatosplenomegaly Pelvic:  External genitalia is normal in appearance.  The vagina is normal in appearance. The cervix is bulbous.+IUD strings seen at os.  Uterus is felt to be normal size, shape, and contour.  No                adnexal masses or tenderness noted. Rectal: Good sphincter tone, no polyps, or hemorrhoids felt.  Hemoccult negative. Extremities:  No swelling or varicosities noted Psych:  No mood changes, alert and cooperative ,seems happy   Impression: Yearly gyn exam IUD in place    Plan: Pap and physical in 1 year Mammogram yearly

## 2014-05-17 ENCOUNTER — Ambulatory Visit
Admission: RE | Admit: 2014-05-17 | Discharge: 2014-05-17 | Disposition: A | Discharge status: Home / Self Care | Payer: Managed Care, Other (non HMO) | Source: Ambulatory Visit | Attending: Family Medicine | Admitting: Family Medicine

## 2014-05-17 ENCOUNTER — Encounter (INDEPENDENT_AMBULATORY_CARE_PROVIDER_SITE_OTHER): Payer: Self-pay

## 2014-05-17 ENCOUNTER — Other Ambulatory Visit: Payer: Self-pay | Admitting: Family Medicine

## 2014-05-17 DIAGNOSIS — Z0000 Encounter for general adult medical examination without abnormal findings: Secondary | ICD-10-CM

## 2014-06-20 ENCOUNTER — Telehealth: Payer: Self-pay | Admitting: Adult Health

## 2014-06-20 MED ORDER — VALACYCLOVIR HCL 500 MG PO TABS: 500.0000 mg | ORAL_TABLET | Freq: Every day | ORAL | Status: DC

## 2014-06-20 MED ORDER — METRONIDAZOLE 500 MG PO TABS: 500.0000 mg | ORAL_TABLET | Freq: Two times a day (BID) | ORAL | Status: DC

## 2014-06-20 NOTE — Telephone Encounter (Signed)
Refilled flagyl and valtrex

## 2014-06-20 NOTE — Telephone Encounter (Signed)
Left message x 1. JSY 

## 2014-06-20 NOTE — Telephone Encounter (Signed)
Spoke with pt. Pt states she started with a herpes breakout yesterday. She is requesting a Rx for Valtrex. Also, pt states she usually keeps med for BV on hand because the smallest things causes her to have BV. Pt states she has used all of her refills. I advised she would need to be seen, but pt states she is not having any problems now, she just wants to have this on hand. Please advise. Thanks!!! [employer]

## 2014-07-19 ENCOUNTER — Other Ambulatory Visit (HOSPITAL_COMMUNITY): Payer: Self-pay | Admitting: Internal Medicine

## 2014-07-19 DIAGNOSIS — Z1231 Encounter for screening mammogram for malignant neoplasm of breast: Secondary | ICD-10-CM

## 2014-08-05 ENCOUNTER — Ambulatory Visit (HOSPITAL_COMMUNITY)
Admission: RE | Admit: 2014-08-05 | Discharge: 2014-08-05 | Disposition: A | Discharge status: Home / Self Care | Payer: Managed Care, Other (non HMO) | Source: Ambulatory Visit | Attending: Internal Medicine | Admitting: Internal Medicine

## 2014-08-05 DIAGNOSIS — Z1231 Encounter for screening mammogram for malignant neoplasm of breast: Secondary | ICD-10-CM | POA: Diagnosis not present

## 2014-09-23 ENCOUNTER — Encounter: Payer: Self-pay | Admitting: Adult Health

## 2015-03-05 ENCOUNTER — Encounter: Payer: Managed Care, Other (non HMO) | Admitting: Adult Health

## 2015-03-05 ENCOUNTER — Telehealth: Payer: Self-pay | Admitting: Adult Health

## 2015-03-05 MED ORDER — METRONIDAZOLE 500 MG PO TABS: 500.0000 mg | ORAL_TABLET | Freq: Two times a day (BID) | ORAL | Status: DC

## 2015-03-05 NOTE — Telephone Encounter (Signed)
Spoke with pt. Pt is requesting a refill on Flagyl. She is not having any symptoms, but likes to have it on hand. Thanks!! [name]

## 2015-03-05 NOTE — Telephone Encounter (Signed)
Will give her 1 refill on flagyl

## 2015-03-11 NOTE — Progress Notes (Signed)
This encounter was created in error - please disregard.

## 2015-05-02 ENCOUNTER — Other Ambulatory Visit: Payer: Managed Care, Other (non HMO) | Admitting: Adult Health

## 2015-05-16 ENCOUNTER — Ambulatory Visit (INDEPENDENT_AMBULATORY_CARE_PROVIDER_SITE_OTHER): Payer: Managed Care, Other (non HMO) | Admitting: Adult Health

## 2015-05-16 ENCOUNTER — Other Ambulatory Visit (HOSPITAL_COMMUNITY)
Admission: RE | Admit: 2015-05-16 | Discharge: 2015-05-16 | Disposition: A | Discharge status: Home / Self Care | Payer: Managed Care, Other (non HMO) | Source: Ambulatory Visit | Attending: Adult Health | Admitting: Adult Health

## 2015-05-16 ENCOUNTER — Encounter: Payer: Self-pay | Admitting: Adult Health

## 2015-05-16 VITALS — BP 110/80 | HR 80 | Ht 62.5 in | Wt 218.5 lb

## 2015-05-16 DIAGNOSIS — Z1212 Encounter for screening for malignant neoplasm of rectum: Secondary | ICD-10-CM | POA: Diagnosis not present

## 2015-05-16 DIAGNOSIS — Z1151 Encounter for screening for human papillomavirus (HPV): Secondary | ICD-10-CM | POA: Diagnosis present

## 2015-05-16 DIAGNOSIS — Z113 Encounter for screening for infections with a predominantly sexual mode of transmission: Secondary | ICD-10-CM | POA: Diagnosis present

## 2015-05-16 DIAGNOSIS — Z975 Presence of (intrauterine) contraceptive device: Secondary | ICD-10-CM

## 2015-05-16 DIAGNOSIS — Z01419 Encounter for gynecological examination (general) (routine) without abnormal findings: Secondary | ICD-10-CM | POA: Insufficient documentation

## 2015-05-16 DIAGNOSIS — E669 Obesity, unspecified: Secondary | ICD-10-CM

## 2015-05-16 LAB — HEMOCCULT GUIAC POC 1CARD (OFFICE): FECAL OCCULT BLD: NEGATIVE

## 2015-05-16 MED ORDER — METRONIDAZOLE 500 MG PO TABS: 500.0000 mg | ORAL_TABLET | Freq: Two times a day (BID) | ORAL | Status: DC

## 2015-05-16 NOTE — Progress Notes (Signed)
Patient ID: [name], female   DOB: Jun 08, 1971, 44 y.o.   MRN: [id number] History of Present Illness: [name] is a 44 year old black female in for well woman gyn exam and pap.   Current Medications, Allergies, Past Medical History, Past Surgical History, Family History and Social History were reviewed in [employer] record.     Review of Systems: Patient denies any headaches, hearing loss, fatigue, blurred vision, shortness of breath, chest pain, abdominal pain, problems with bowel movements, urination, or intercourse. No mood swings.Has body aches esp in hips, has RA, complains of weight, can't work out.Had been to bariatric center in past.    Physical Exam:BP 110/80 mmHg  Pulse 80  Ht 5' 2.5" (1.588 m)  Wt 218 lb 8 oz (99.111 kg)  BMI 39.30 kg/m2 General:  Well developed, well nourished, no acute distress Skin:  Warm and dry Neck:  Midline trachea, normal thyroid, good ROM, no lymphadenopathy Lungs; Clear to auscultation bilaterally Breast:  No dominant palpable mass, retraction, or nipple discharge Cardiovascular: Regular rate and rhythm Abdomen:  Soft, non tender, no hepatosplenomegaly Pelvic:  External genitalia is normal in appearance, no lesions.  The vagina is normal in appearance. Urethra has no lesions or masses. The cervix is bulbous, +IUD strings.Pap with HPV and GC/CHL performed.  Uterus is felt to be normal size, shape, and contour.  No adnexal masses or tenderness noted.Bladder is non tender, no masses felt. Rectal: Good sphincter tone, no polyps, or hemorrhoids felt.  Hemoccult negative. Extremities/musculoskeletal:  No swelling or varicosities noted, no clubbing or cyanosis Psych:  No mood changes, alert and cooperative,seems happy Discussed sleeve surgery may be option for weight loss, and her RA.  Impression: Well woman gyn exam with pap IUD in place Obesity    Plan: Number given for Dr Excell Seltzer to talk about sleeve  surgery Check CBC,CMP,TSH and lipids and T4 Physical in 1 year, pap in 3 if normal Mammogram yearly

## 2015-05-16 NOTE — Patient Instructions (Addendum)
Physical in 1 year Mammogram yearly Will talk when labs back Call Dr Excell Seltzer to talk about sleeve

## 2015-05-19 LAB — CYTOLOGY - PAP

## 2015-06-03 ENCOUNTER — Telehealth: Payer: Self-pay | Admitting: Adult Health

## 2015-06-03 NOTE — Telephone Encounter (Signed)
Pt not going to get labs now, see is seeing MD for bariatric surgery

## 2015-07-07 ENCOUNTER — Telehealth: Payer: Self-pay | Admitting: Adult Health

## 2015-07-07 DIAGNOSIS — Z139 Encounter for screening, unspecified: Secondary | ICD-10-CM

## 2015-07-07 NOTE — Telephone Encounter (Signed)
Pt wants labs this Saturday will order, she did not meet requirement for sleeve surgery

## 2015-07-20 LAB — CBC
Hematocrit: 41.9 % (ref 34.0–46.6)
Hemoglobin: 14 g/dL (ref 11.1–15.9)
MCH: 32 pg (ref 26.6–33.0)
MCHC: 33.4 g/dL (ref 31.5–35.7)
MCV: 96 fL (ref 79–97)
Platelets: 321 10*3/uL (ref 150–379)
RBC: 4.37 x10E6/uL (ref 3.77–5.28)
RDW: 13.8 % (ref 12.3–15.4)
WBC: 8.2 10*3/uL (ref 3.4–10.8)

## 2015-07-20 LAB — COMPREHENSIVE METABOLIC PANEL
A/G RATIO: 1.6 (ref 1.1–2.5)
ALBUMIN: 4.6 g/dL (ref 3.5–5.5)
ALT: 15 IU/L (ref 0–32)
AST: 16 IU/L (ref 0–40)
Alkaline Phosphatase: 81 IU/L (ref 39–117)
BILIRUBIN TOTAL: 0.3 mg/dL (ref 0.0–1.2)
BUN / CREAT RATIO: 13 (ref 9–23)
BUN: 9 mg/dL (ref 6–24)
CHLORIDE: 102 mmol/L (ref 97–108)
CO2: 24 mmol/L (ref 18–29)
Calcium: 9.3 mg/dL (ref 8.7–10.2)
Creatinine, Ser: 0.7 mg/dL (ref 0.57–1.00)
GFR calc non Af Amer: 106 mL/min/{1.73_m2} (ref >59)
GFR, EST AFRICAN AMERICAN: 122 mL/min/{1.73_m2} (ref >59)
Globulin, Total: 2.8 g/dL (ref 1.5–4.5)
Glucose: 105 mg/dL — ABNORMAL HIGH (ref 65–99)
POTASSIUM: 3.9 mmol/L (ref 3.5–5.2)
Sodium: 141 mmol/L (ref 134–144)
TOTAL PROTEIN: 7.4 g/dL (ref 6.0–8.5)

## 2015-07-20 LAB — LIPID PANEL
CHOLESTEROL TOTAL: 184 mg/dL (ref 100–199)
Chol/HDL Ratio: 5.6 ratio units — ABNORMAL HIGH (ref 0.0–4.4)
HDL: 33 mg/dL — ABNORMAL LOW (ref >39)
LDL Calculated: 132 mg/dL — ABNORMAL HIGH (ref 0–99)
TRIGLYCERIDES: 97 mg/dL (ref 0–149)
VLDL Cholesterol Cal: 19 mg/dL (ref 5–40)

## 2015-07-20 LAB — T4: T4, Total: 9.3 ug/dL (ref 4.5–12.0)

## 2015-07-20 LAB — TSH: TSH: 2.21 u[IU]/mL (ref 0.450–4.500)

## 2015-07-21 ENCOUNTER — Telehealth: Payer: Self-pay | Admitting: Adult Health

## 2015-07-21 NOTE — Telephone Encounter (Signed)
Pt aware of labs, needs to exercise 20-30 minutes most days, and decrease carbs, will mail her a copy for bariatric clinic, needs to repeat in 6 months

## 2015-09-16 ENCOUNTER — Other Ambulatory Visit: Payer: Self-pay | Admitting: Adult Health

## 2015-09-16 DIAGNOSIS — Z1231 Encounter for screening mammogram for malignant neoplasm of breast: Secondary | ICD-10-CM

## 2015-09-24 ENCOUNTER — Ambulatory Visit (HOSPITAL_COMMUNITY)
Admission: RE | Admit: 2015-09-24 | Discharge: 2015-09-24 | Disposition: A | Discharge status: Home / Self Care | Payer: Managed Care, Other (non HMO) | Source: Ambulatory Visit | Attending: Adult Health | Admitting: Adult Health

## 2015-09-24 DIAGNOSIS — Z1231 Encounter for screening mammogram for malignant neoplasm of breast: Secondary | ICD-10-CM | POA: Diagnosis present

## 2015-11-23 ENCOUNTER — Emergency Department (HOSPITAL_COMMUNITY): Payer: Managed Care, Other (non HMO)

## 2015-11-23 ENCOUNTER — Emergency Department (HOSPITAL_COMMUNITY)
Admission: EM | Admit: 2015-11-23 | Discharge: 2015-11-23 | Disposition: A | Discharge status: Home / Self Care | Payer: Managed Care, Other (non HMO) | Attending: Emergency Medicine | Admitting: Emergency Medicine

## 2015-11-23 ENCOUNTER — Encounter (HOSPITAL_COMMUNITY): Payer: Self-pay | Admitting: *Deleted

## 2015-11-23 DIAGNOSIS — M1990 Unspecified osteoarthritis, unspecified site: Secondary | ICD-10-CM | POA: Insufficient documentation

## 2015-11-23 DIAGNOSIS — Z792 Long term (current) use of antibiotics: Secondary | ICD-10-CM | POA: Diagnosis not present

## 2015-11-23 DIAGNOSIS — Z791 Long term (current) use of non-steroidal anti-inflammatories (NSAID): Secondary | ICD-10-CM | POA: Diagnosis not present

## 2015-11-23 DIAGNOSIS — R079 Chest pain, unspecified: Secondary | ICD-10-CM | POA: Insufficient documentation

## 2015-11-23 DIAGNOSIS — Z8619 Personal history of other infectious and parasitic diseases: Secondary | ICD-10-CM | POA: Diagnosis not present

## 2015-11-23 DIAGNOSIS — M069 Rheumatoid arthritis, unspecified: Secondary | ICD-10-CM | POA: Insufficient documentation

## 2015-11-23 DIAGNOSIS — E669 Obesity, unspecified: Secondary | ICD-10-CM | POA: Insufficient documentation

## 2015-11-23 DIAGNOSIS — Z79899 Other long term (current) drug therapy: Secondary | ICD-10-CM | POA: Insufficient documentation

## 2015-11-23 LAB — CBC
HCT: 41 % (ref 36.0–46.0)
Hemoglobin: 13.7 g/dL (ref 12.0–15.0)
MCH: 32.2 pg (ref 26.0–34.0)
MCHC: 33.4 g/dL (ref 30.0–36.0)
MCV: 96.2 fL (ref 78.0–100.0)
PLATELETS: 288 10*3/uL (ref 150–400)
RBC: 4.26 MIL/uL (ref 3.87–5.11)
RDW: 13.5 % (ref 11.5–15.5)
WBC: 8.4 10*3/uL (ref 4.0–10.5)

## 2015-11-23 LAB — BASIC METABOLIC PANEL
ANION GAP: 7 (ref 5–15)
BUN: 10 mg/dL (ref 6–20)
CALCIUM: 9.2 mg/dL (ref 8.9–10.3)
CO2: 27 mmol/L (ref 22–32)
Chloride: 106 mmol/L (ref 101–111)
Creatinine, Ser: 0.77 mg/dL (ref 0.44–1.00)
GFR calc Af Amer: >60 mL/min (ref >60)
GLUCOSE: 96 mg/dL (ref 65–99)
POTASSIUM: 3.8 mmol/L (ref 3.5–5.1)
SODIUM: 140 mmol/L (ref 135–145)

## 2015-11-23 LAB — TROPONIN I: Troponin I: <0.03 ng/mL (ref <0.031)

## 2015-11-23 MED ORDER — NAPROXEN 500 MG PO TABS: 500.0000 mg | ORAL_TABLET | Freq: Two times a day (BID) | ORAL | Status: DC

## 2015-11-23 NOTE — Discharge Instructions (Signed)
Tests were good. Medication for pain and inflammation. Follow-up your primary care doctor.

## 2015-11-23 NOTE — ED Provider Notes (Signed)
CSN: [id number]     Arrival date & time 11/23/15  0554 History   First MD Initiated Contact with Patient 11/23/15 [phone]     Chief Complaint  Patient presents with  . Chest Pain    pain in chest for 3 weeks. left arm was feeling funny and its hard to get appt with  my doctor and decided to come to er per patient     (Consider location/radiation/quality/duration/timing/severity/associated sxs/prior Treatment) HPI..... Inferior central chest pain for several weeks, intermittent in nature. Nothing makes symptoms better or worse. ? radiation to the left arm. No dyspnea, diaphoresis, nausea. Nonsmoker. Past medical history includes rheumatoid arthritis. Patient has taken nothing for the pain.  Past Medical History  Diagnosis Date  . Arthritis   . HSV (herpes simplex virus) infection   . BV (bacterial vaginosis)   . Trichomonas   . Obesity   . IUD (intrauterine device) in place 03/20/2014    IUD inserted 01/23/13  . RA (rheumatoid arthritis) [facility])    Past Surgical History  Procedure Laterality Date  . Appendectomy    . Cholecystectomy    . Hand surgery    . Tonsillectomy     Family History  Problem Relation Age of Onset  . Diabetes Mother   . COPD Mother   . Arthritis Mother   . Hypertension Mother   . Heart disease Mother   . Asthma Son   . Cancer Maternal Grandmother     lung  . Heart disease Paternal Grandmother     CHF   Social History  Substance Use Topics  . Smoking status: Never Smoker   . Smokeless tobacco: Never Used  . Alcohol Use: No     Comment: occ   OB History    Gravida Para Term Preterm AB TAB SAB Ectopic Multiple Living   2 1   1 1    1      Review of Systems  All other systems reviewed and are negative.     Allergies  Contrast media  Home Medications   Prior to Admission medications   Medication Sig Start Date End Date Taking? Authorizing Provider  Biotin w/ Vitamins C & E (HAIR SKIN & NAILS GUMMIES PO) Take by mouth. Chews 3 daily   Yes  Historical Provider, MD  Flaxseed, Linseed, (FLAX SEED OIL PO) Take by mouth. Takes 2 daily   Yes Historical Provider, MD  folic acid (FOLVITE) 1 MG tablet Take 1 mg by mouth. Takes 2 pills once daily.   Yes Historical Provider, MD  levonorgestrel (MIRENA) 20 MCG/24HR IUD 1 each by Intrauterine route once.   Yes Historical Provider, MD  loratadine (CLARITIN) 10 MG tablet Take 10 mg by mouth daily.   Yes Historical Provider, MD  methotrexate (RHEUMATREX) 2.5 MG tablet Take 20 mg by mouth once a week. On TUESDAYS : Caution:Chemotherapy. Protect from light.   Yes Historical Provider, MD  Misc Natural Products (TART CHERRY ADVANCED PO) Take by mouth. Takes 2 daily   Yes Historical Provider, MD  metroNIDAZOLE (FLAGYL) 500 MG tablet Take 1 tablet (500 mg total) by mouth 2 (two) times daily. 05/16/15   Estill Dooms, NP  naproxen (NAPROSYN) 500 MG tablet Take 1 tablet (500 mg total) by mouth 2 (two) times daily. 11/23/15   Nat Christen, MD  valACYclovir (VALTREX) 500 MG tablet Take 1 tablet (500 mg total) by mouth daily. Patient taking differently: Take 500 mg by mouth as needed.  06/20/14   Eduard Clos  Laurann Montana, NP   BP 106/62 mmHg  Pulse 83  Temp(Src) 98 F (36.7 C) (Oral)  Resp 19  Ht 5' 1.5" (1.562 m)  Wt 210 lb (95.255 kg)  BMI 39.04 kg/m2  SpO2 99% Physical Exam  Constitutional: She is oriented to person, place, and time. She appears well-developed and well-nourished.  HENT:  Head: Normocephalic and atraumatic.  Eyes: Conjunctivae and EOM are normal. Pupils are equal, round, and reactive to light.  Neck: Normal range of motion. Neck supple.  Cardiovascular: Normal rate and regular rhythm.   Pulmonary/Chest: Effort normal and breath sounds normal.  Abdominal: Soft. Bowel sounds are normal.  Musculoskeletal: Normal range of motion.  Neurological: She is alert and oriented to person, place, and time.  Skin: Skin is warm and dry.  Psychiatric: She has a normal mood and affect. Her behavior  is normal.  Nursing note and vitals reviewed.   ED Course  Procedures (including critical care time) Labs Review Labs Reviewed  TROPONIN I  CBC  BASIC METABOLIC PANEL    Imaging Review Dg Chest 2 View  11/23/2015  CLINICAL DATA:  Acute onset of generalized chest pain and left arm pain. Initial encounter. EXAM: CHEST  2 VIEW COMPARISON:  Chest radiograph performed 05/17/2014 FINDINGS: The lungs are well-aerated. Mild peribronchial thickening is noted. There is no evidence of focal opacification, pleural effusion or pneumothorax. The heart is normal in size; the mediastinal contour is within normal limits. No acute osseous abnormalities are seen. Clips are noted within the right upper quadrant, reflecting prior cholecystectomy. IMPRESSION: Mild peribronchial thickening noted.  Lungs otherwise clear. Electronically Signed   By: Garald Balding M.D.   On: 11/23/2015 07:59   I have personally reviewed and evaluated these images and lab results as part of my medical decision-making.   EKG Interpretation   Date/Time:  Sunday November 23 2015 [id number] EST Ventricular Rate:  76 PR Interval:  164 QRS Duration: 90 QT Interval:  378 QTC Calculation: 425 R Axis:   51 Text Interpretation:  Sinus rhythm Normal ECG No previous ECGs available  Confirmed by Christy Gentles  MD, Elenore Rota ([id number]) on 11/23/2015 6:19:56 AM      MDM   Final diagnoses:  Chest pain, unspecified chest pain type    History is vague for ACS or pulmonary embolism.  Patient looks well. Screening tests including labs, troponin, EKG, chest x-ray all normal. Discharge medications Naprosyn 500 mg. She has primary care follow-up.    Nat Christen, MD 11/23/15 [phone]

## 2015-12-03 ENCOUNTER — Other Ambulatory Visit: Payer: Self-pay | Admitting: Adult Health

## 2016-02-28 ENCOUNTER — Other Ambulatory Visit: Payer: Self-pay | Admitting: Adult Health

## 2016-06-16 ENCOUNTER — Encounter: Payer: Self-pay | Admitting: Adult Health

## 2016-06-16 ENCOUNTER — Ambulatory Visit (INDEPENDENT_AMBULATORY_CARE_PROVIDER_SITE_OTHER): Payer: Managed Care, Other (non HMO) | Admitting: Adult Health

## 2016-06-16 VITALS — BP 116/70 | HR 78 | Ht 62.5 in | Wt 227.0 lb

## 2016-06-16 DIAGNOSIS — Z01419 Encounter for gynecological examination (general) (routine) without abnormal findings: Secondary | ICD-10-CM

## 2016-06-16 DIAGNOSIS — K5900 Constipation, unspecified: Secondary | ICD-10-CM | POA: Diagnosis not present

## 2016-06-16 DIAGNOSIS — Z1211 Encounter for screening for malignant neoplasm of colon: Secondary | ICD-10-CM

## 2016-06-16 DIAGNOSIS — Z01411 Encounter for gynecological examination (general) (routine) with abnormal findings: Secondary | ICD-10-CM

## 2016-06-16 DIAGNOSIS — Z975 Presence of (intrauterine) contraceptive device: Secondary | ICD-10-CM

## 2016-06-16 HISTORY — DX: Constipation, unspecified: K59.00

## 2016-06-16 LAB — HEMOCCULT GUIAC POC 1CARD (OFFICE): FECAL OCCULT BLD: NEGATIVE

## 2016-06-16 MED ORDER — LUBIPROSTONE 24 MCG PO CAPS: 24.0000 ug | ORAL_CAPSULE | Freq: Every day | ORAL | 6 refills | Status: DC

## 2016-06-16 NOTE — Patient Instructions (Signed)
Try amitiza Physical in 1 year Mammogram yearly  Labs fasting in future

## 2016-06-16 NOTE — Progress Notes (Signed)
Patient ID: [name], female   DOB: Aug 22, 1971, 45 y.o.   MRN: [id number] History of Present Illness:  [name] is a 45 year old black female in for a well woman gyn exam, she had a normal pap with negative HPV 05/16/15.She is having constipation issues,has tried miralax.Has stopped going to bariatric clinic, thinking about surgery. PCP is Dr Legrand Rams.  Current Medications, Allergies, Past Medical History, Past Surgical History, Family History and Social History were reviewed in [employer] record.     Review of Systems: Patient denies any headaches, hearing loss, fatigue, blurred vision, shortness of breath, chest pain, abdominal pain, problems with urination, or intercourse. No  mood swings.Has joint pain with RA.  +constipation  Physical Exam: General:  Well developed, well nourished, no acute distress Skin:  Warm and dry Neck:  Midline trachea, normal thyroid, good ROM, no lymphadenopathy Lungs; Clear to auscultation bilaterally Breast:  No dominant palpable mass, retraction, or nipple discharge Cardiovascular: Regular rate and rhythm Abdomen:  Soft, non tender, no hepatosplenomegaly Pelvic:  External genitalia is normal in appearance, no lesions.  The vagina is normal in appearance. Urethra has no lesions or masses. The cervix is bulbous. +IUD strings. Uterus is felt to be normal size, shape, and contour.  No adnexal masses or tenderness noted.Bladder is non tender, no masses felt. Rectal: Good sphincter tone, no polyps, or hemorrhoids felt.  Hemoccult negative. Extremities/musculoskeletal:  No swelling or varicosities noted, no clubbing or cyanosis Psych:  No mood changes, alert and cooperative,seems happy   Impression: Well woman gyn exam no pap Constipation IUD in place     Plan: Rx amitiza 24 mcg take 1 daily #30 with 6 refills, given 8 tabs lot [id number] exp 11/19  Physical in 1 year, pap in 2019 Mammogram yearly Fasting labs in future

## 2016-06-21 ENCOUNTER — Telehealth: Payer: Self-pay | Admitting: Adult Health

## 2016-06-21 NOTE — Telephone Encounter (Signed)
Pt states Derrek Monaco, NP gave Amitiza 24 mcg for constipation, pt took 2 tablets a day with no improvement. Is there an alternative med?

## 2016-06-21 NOTE — Telephone Encounter (Signed)
Please call about meds °

## 2016-06-22 NOTE — Telephone Encounter (Signed)
Pt advised per Derrek Monaco, NP can take time (up to 2 weeks to see results from [place], if no improvement x 2 weeks can try Linzess. Pt verbalized understanding.

## 2016-08-20 ENCOUNTER — Ambulatory Visit (HOSPITAL_COMMUNITY)
Admission: EM | Admit: 2016-08-20 | Discharge: 2016-08-20 | Disposition: A | Discharge status: Home / Self Care | Payer: Managed Care, Other (non HMO) | Attending: Family Medicine | Admitting: Family Medicine

## 2016-08-20 ENCOUNTER — Encounter (HOSPITAL_COMMUNITY): Payer: Self-pay | Admitting: *Deleted

## 2016-08-20 DIAGNOSIS — J302 Other seasonal allergic rhinitis: Secondary | ICD-10-CM | POA: Diagnosis not present

## 2016-08-20 MED ORDER — IPRATROPIUM BROMIDE 0.06 % NA SOLN: 2.0000 | Freq: Four times a day (QID) | NASAL | 1 refills | Status: DC

## 2016-08-20 MED ORDER — CETIRIZINE HCL 10 MG PO TABS: 10.0000 mg | ORAL_TABLET | Freq: Every day | ORAL | 1 refills | Status: DC

## 2016-08-20 NOTE — ED Provider Notes (Signed)
[place]    CSN: [id number] Arrival date & time: 08/20/16  1522     History   Chief Complaint Chief Complaint  Patient presents with  . Otalgia    HPI [name] is a 45 y.o. female.   The history is provided by the patient.  Otalgia  Location:  Bilateral Behind ear:  No abnormality Quality:  Pressure Severity:  Mild Onset quality:  Gradual Duration:  2 days Chronicity:  New Relieved by:  Nothing Worsened by:  Nothing Ineffective treatments:  None tried Associated symptoms: rhinorrhea   Associated symptoms: no ear discharge and no fever     Past Medical History:  Diagnosis Date  . Arthritis   . BV (bacterial vaginosis)   . Constipation 06/16/2016  . HSV (herpes simplex virus) infection   . IUD (intrauterine device) in place 03/20/2014   IUD inserted 01/23/13  . Obesity   . RA (rheumatoid arthritis) ([place])   . Trichomonas     Patient Active Problem List   Diagnosis Date Noted  . Constipation 06/16/2016  . Obesity 05/16/2015  . IUD (intrauterine device) in place 03/20/2014    Past Surgical History:  Procedure Laterality Date  . APPENDECTOMY    . CHOLECYSTECTOMY    . HAND SURGERY    . TONSILLECTOMY      OB History    Gravida Para Term Preterm AB Living   2 1     1 1    SAB TAB Ectopic Multiple Live Births     1     1       Home Medications    Prior to Admission medications   Medication Sig Start Date End Date Taking? Authorizing Provider  Biotin w/ Vitamins C & E (HAIR SKIN & NAILS GUMMIES PO) Take by mouth. Chews 2 daily    Historical Provider, MD  cetirizine (ZYRTEC) 10 MG tablet Take 1 tablet (10 mg total) by mouth daily. One tab daily for allergies 08/20/16   Billy Fischer, MD  folic acid (FOLVITE) 1 MG tablet Take 1 mg by mouth. Takes 2 pills once daily.    Historical Provider, MD  ipratropium (ATROVENT) 0.06 % nasal spray Place 2 sprays into both nostrils 4 (four) times daily. 08/20/16   Billy Fischer, MD  levonorgestrel  (MIRENA) 20 MCG/24HR IUD 1 each by Intrauterine route once.    Historical Provider, MD  loratadine (CLARITIN) 10 MG tablet Take 10 mg by mouth daily as needed.     Historical Provider, MD  lubiprostone (AMITIZA) 24 MCG capsule Take 1 capsule (24 mcg total) by mouth daily with breakfast. 06/16/16   Estill Dooms, NP  methotrexate (RHEUMATREX) 2.5 MG tablet Take 20 mg by mouth once a week. On TUESDAYS : Caution:Chemotherapy. Protect from light.    Historical Provider, MD  valACYclovir (VALTREX) 500 MG tablet TAKE 1 TABLET (500 MG TOTAL) BY MOUTH DAILY. Patient taking differently: TAKE 1 TABLET (500 MG TOTAL) BY MOUTH DAILY PRN. 12/04/15   Estill Dooms, NP    Family History Family History  Problem Relation Age of Onset  . Diabetes Mother   . COPD Mother   . Arthritis Mother   . Hypertension Mother   . Heart disease Mother   . Asthma Son   . Cancer Maternal Grandmother     lung  . Heart disease Paternal Grandmother     CHF    Social History Social History  Substance Use Topics  . Smoking status:  Never Smoker  . Smokeless tobacco: Never Used  . Alcohol use Yes     Comment: occ     Allergies   Contrast media [iodinated diagnostic agents]   Review of Systems Review of Systems  Constitutional: Negative.  Negative for fever.  HENT: Positive for ear pain, rhinorrhea and sinus pressure. Negative for ear discharge.   Respiratory: Negative.   Cardiovascular: Negative.      Physical Exam Triage Vital Signs ED Triage Vitals  Enc Vitals Group     BP 08/20/16 1554 120/64     Pulse Rate 08/20/16 1554 81     Resp 08/20/16 1554 16     Temp 08/20/16 1554 98.7 F (37.1 C)     Temp Source 08/20/16 1554 Oral     SpO2 08/20/16 1554 100 %     Weight --      Height --      Head Circumference --      Peak Flow --      Pain Score 08/20/16 1645 8     Pain Loc --      Pain Edu? --      Excl. in [place]? --    No data found.   Updated Vital Signs BP 120/64 (BP Location: Left  Arm)   Pulse 81   Temp 98.7 F (37.1 C) (Oral)   Resp 16   SpO2 100%   Visual Acuity Right Eye Distance:   Left Eye Distance:   Bilateral Distance:    Right Eye Near:   Left Eye Near:    Bilateral Near:     Physical Exam  Constitutional: She is oriented to person, place, and time. She appears well-developed and well-nourished. No distress.  HENT:  Head: Normocephalic.  Right Ear: External ear normal.  Left Ear: External ear normal.  Mouth/Throat: Oropharynx is clear and moist.  Eyes: Conjunctivae and EOM are normal. Pupils are equal, round, and reactive to light.  Neck: Normal range of motion. Neck supple.  Cardiovascular: Normal rate.   Pulmonary/Chest: Effort normal and breath sounds normal.  Lymphadenopathy:    She has no cervical adenopathy.  Neurological: She is alert and oriented to person, place, and time.  Nursing note and vitals reviewed.    UC Treatments / Results  Labs (all labs ordered are listed, but only abnormal results are displayed) Labs Reviewed - No data to display  EKG  EKG Interpretation None       Radiology No results found.  Procedures Procedures (including critical care time)  Medications Ordered in UC Medications - No data to display   Initial Impression / Assessment and Plan / UC Course  I have reviewed the triage vital signs and the nursing notes.  Pertinent labs & imaging results that were available during my care of the patient were reviewed by me and considered in my medical decision making (see chart for details).  Clinical Course      Final Clinical Impressions(s) / UC Diagnoses   Final diagnoses:  Seasonal allergic rhinitis    New Prescriptions New Prescriptions   CETIRIZINE (ZYRTEC) 10 MG TABLET    Take 1 tablet (10 mg total) by mouth daily. One tab daily for allergies   IPRATROPIUM (ATROVENT) 0.06 % NASAL SPRAY    Place 2 sprays into both nostrils 4 (four) times daily.     Billy Fischer, MD 08/20/16  [phone]

## 2016-08-20 NOTE — ED Triage Notes (Signed)
Pt  Has    Symptoms  Of  Bilateral earache   As  Well  As  A  Headache  With symptoms  Onset     Last  Week       Pt  Reports  Symptoms  Not  releived  By otc  meds         Pt  Ambulatory  To  Exam room   Appearing   In no   Acute     Distress   Speaking in  Complete  sentances

## 2016-08-28 ENCOUNTER — Other Ambulatory Visit: Payer: Self-pay | Admitting: Radiology

## 2016-08-28 DIAGNOSIS — Z79899 Other long term (current) drug therapy: Secondary | ICD-10-CM

## 2016-09-22 ENCOUNTER — Other Ambulatory Visit: Payer: Self-pay | Admitting: Rheumatology

## 2016-09-22 NOTE — Telephone Encounter (Signed)
Last visit 11/2015 Next visit 10/20/16 Labs 08/23/16 WNL Patient was advised last month to make appt, she did sch one, Ok to refill per Dr Estanislado Pandy One month supply since she is past due for follow up

## 2016-10-20 ENCOUNTER — Ambulatory Visit: Payer: Managed Care, Other (non HMO) | Admitting: Rheumatology

## 2016-10-28 ENCOUNTER — Other Ambulatory Visit: Payer: Self-pay | Admitting: Rheumatology

## 2016-10-28 NOTE — Telephone Encounter (Signed)
Last visit: 12/10/15 No follow up scheduled.  Labs: 08/23/16 WNL  Left message for patient to contact the office to schedule a follow up appointment.

## 2016-10-29 ENCOUNTER — Other Ambulatory Visit: Payer: Self-pay | Admitting: Adult Health

## 2016-11-09 DIAGNOSIS — Z79899 Other long term (current) drug therapy: Secondary | ICD-10-CM | POA: Insufficient documentation

## 2016-11-09 DIAGNOSIS — M0579 Rheumatoid arthritis with rheumatoid factor of multiple sites without organ or systems involvement: Secondary | ICD-10-CM | POA: Insufficient documentation

## 2016-11-09 DIAGNOSIS — Z9114 Patient's other noncompliance with medication regimen: Secondary | ICD-10-CM | POA: Insufficient documentation

## 2016-11-09 NOTE — Progress Notes (Signed)
Office Visit Note  Patient: [name]             Date of Birth: [birth year]           MRN: [id number]             PCP: Rosita Fire, MD Referring: Rosita Fire, MD Visit Date: 11/10/2016 Occupation: @GUAROCC @    Subjective:  Follow-up Follow-up on rheumatoid arthritis and high-risk prescription  History of Present Illness: [name] is a 45 y.o. female  Last seen 12/10/2015. Patient has a history of noncompliance with office visits.  Currently doing well with her rheumatoid arthritis. No joint pain swelling and stiffness. She does have occasional discomfort but that's more consistent with osteoarthritis then with rheumatoid arthritis. She takes methotrexate 8 pills per week and folic acid 2 per day. Note that at one time we gave her 10 pills per week but her LFTs elevated so therefore we only to 8 pills per week. Labs from 08/20/2016 shows CMP with GFR normal CBC with differential is normal  She does have occasional right knee joint pain.  We discussed Visco supplementation for future relief.  We did x-rays on January 2017 of hands and feet. No changes were seen versus a 2012 x-rays.  She is due for blood work today and we'll order CBC with differential CMP with GFR. We'll have patient come back in 4 months because of the history of noncompliance.   Activities of Daily Living:  Patient reports morning stiffness for 15 minutes.   Patient Denies nocturnal pain.  Difficulty dressing/grooming: Denies Difficulty climbing stairs: Denies Difficulty getting out of chair: Denies Difficulty using hands for taps, buttons, cutlery, and/or writing: Denies   Review of Systems  Constitutional: Negative for fatigue.  HENT: Negative for mouth sores and mouth dryness.   Eyes: Negative for dryness.  Respiratory: Negative for shortness of breath.   Gastrointestinal: Negative for constipation and diarrhea.  Musculoskeletal: Negative for myalgias and myalgias.  Skin:  Negative for sensitivity to sunlight.  Psychiatric/Behavioral: Negative for decreased concentration and sleep disturbance.    PMFS History:  Patient Active Problem List   Diagnosis Date Noted  . Rheumatoid arthritis with rheumatoid factor of multiple sites without organ or systems involvement ([place]) 11/09/2016  . High risk medication use 11/09/2016  . Sacroiliitis, not elsewhere classified ([place]) 11/09/2016  . Non compliance w medication regimen 11/09/2016  . Constipation 06/16/2016  . Obesity 05/16/2015  . IUD (intrauterine device) in place 03/20/2014    Past Medical History:  Diagnosis Date  . Arthritis   . BV (bacterial vaginosis)   . Constipation 06/16/2016  . HSV (herpes simplex virus) infection   . IUD (intrauterine device) in place 03/20/2014   IUD inserted 01/23/13  . Obesity   . RA (rheumatoid arthritis) ([place])   . Trichomonas     Family History  Problem Relation Age of Onset  . Diabetes Mother   . COPD Mother   . Arthritis Mother   . Hypertension Mother   . Heart disease Mother   . Asthma Son   . Cancer Maternal Grandmother     lung  . Heart disease Paternal Grandmother     CHF   Past Surgical History:  Procedure Laterality Date  . APPENDECTOMY    . CHOLECYSTECTOMY    . HAND SURGERY    . TONSILLECTOMY     Social History   Social History Narrative  . No narrative on file     Objective: Vital Signs:  BP 124/78   Pulse 80   Resp 14   Ht 5\' 2"  (1.575 m)   Wt 217 lb (98.4 kg)   BMI 39.69 kg/m    Physical Exam  Constitutional: She is oriented to person, place, and time. She appears well-developed and well-nourished.  HENT:  Head: Normocephalic and atraumatic.  Eyes: EOM are normal. Pupils are equal, round, and reactive to light.  Cardiovascular: Normal rate, regular rhythm and normal heart sounds.  Exam reveals no gallop and no friction rub.   No murmur heard. Pulmonary/Chest: Effort normal and breath sounds normal. She has no wheezes. She has no  rales.  Abdominal: Soft. Bowel sounds are normal. She exhibits no distension. There is no tenderness. There is no guarding. No hernia.  Musculoskeletal: Normal range of motion. She exhibits no edema, tenderness or deformity.  Lymphadenopathy:    She has no cervical adenopathy.  Neurological: She is alert and oriented to person, place, and time. Coordination normal.  Skin: Skin is warm and dry. Capillary refill takes less than 2 seconds. No rash noted.  Psychiatric: She has a normal mood and affect. Her behavior is normal.     Musculoskeletal Exam:  Full range of motion of all joints Grip strength is equal and strong bilaterally Fibromyalgia tender points are all absent  CDAI Exam: CDAI Homunculus Exam:   Joint Counts:  CDAI Tender Joint count: 0 CDAI Swollen Joint count: 0  Global Assessments:  Patient Global Assessment: 2 Provider Global Assessment: 2  CDAI Calculated Score: 4  No synovitis on examination. Note that on January 2017 visit there is also no synovitis.  Investigation: Findings:  04/10/2015 TB Gold, hepatitis panel, SPEP, immunoglobulins, and HIV negative/normal  08/20/2016 normal CBC and CMP with GFR 09/16/2014 MRI scan demonstrates bilateral sacroiliitis   12/10/2015 X-rays of bilateral hands 2 views versus June 2012 shows mild 2nd and 3rd MCP joint space narrowing bilaterally.  No erosive changes noted.  No change versus June 2012.  X-rays of bilateral feet 2 views versus June 2014 left foot x-ray shows bilateral 1st MTP joint space narrowing, bilateral mild PIP and DIP narrowing, no erosions.  No changes versus June 2014 of the left foot.     Imaging: No results found.  Speciality Comments: No specialty comments available.    Procedures:  No procedures performed Allergies: Contrast media [iodinated diagnostic agents]   Assessment / Plan:     Visit Diagnoses: Rheumatoid arthritis with rheumatoid factor of multiple sites without organ or systems  involvement (HCC)  High risk medication use - Plan: COMPLETE METABOLIC PANEL WITH GFR, CBC with Differential/Platelet  Sacroiliitis, not elsewhere classified (HCC)  Non compliance w medication regimen  She has sacroiliitis on the MRI  Non compliance with labs and follow up    Plan: #1: Refill methotrexate 8 pills per week ninety-day supply with no refills #2: Refill folic acid 2 mg every day ninety-day supply with 4 refills #3: Labs due today CBC with differential CMP with GFR in office #4: Return to clinic in 4 months #5: We discussed Visco supplementation for future use. At this time patient does not need a but she does have occasional right knee joint pain. She has a history of osteoarthritis of the knee joint. Note that she has failed weight loss before cortisone before and NSAIDs.  Orders: Orders Placed This Encounter  Procedures  . COMPLETE METABOLIC PANEL WITH GFR  . CBC with Differential/Platelet   No orders of the defined types were placed in  this encounter.   Face-to-face time spent with patient was 30 minutes. 50% of time was spent in counseling and coordination of care.  Follow-Up Instructions: Return in about 5 months (around 04/10/2017) for RA, MTX 8/wk, Folic 2/d, Rt. knee pain, Bil OA KJ, .   Eliezer Lofts, PA-C

## 2016-11-10 ENCOUNTER — Ambulatory Visit (INDEPENDENT_AMBULATORY_CARE_PROVIDER_SITE_OTHER): Payer: Managed Care, Other (non HMO) | Admitting: Rheumatology

## 2016-11-10 ENCOUNTER — Encounter: Payer: Self-pay | Admitting: Rheumatology

## 2016-11-10 VITALS — BP 124/78 | HR 80 | Resp 14 | Ht 62.0 in | Wt 217.0 lb

## 2016-11-10 DIAGNOSIS — Z9114 Patient's other noncompliance with medication regimen: Secondary | ICD-10-CM | POA: Diagnosis not present

## 2016-11-10 DIAGNOSIS — M461 Sacroiliitis, not elsewhere classified: Secondary | ICD-10-CM | POA: Diagnosis not present

## 2016-11-10 DIAGNOSIS — E669 Obesity, unspecified: Secondary | ICD-10-CM

## 2016-11-10 DIAGNOSIS — IMO0001 Reserved for inherently not codable concepts without codable children: Secondary | ICD-10-CM

## 2016-11-10 DIAGNOSIS — Z6841 Body Mass Index (BMI) 40.0 and over, adult: Secondary | ICD-10-CM

## 2016-11-10 DIAGNOSIS — Z79899 Other long term (current) drug therapy: Secondary | ICD-10-CM | POA: Diagnosis not present

## 2016-11-10 DIAGNOSIS — M0579 Rheumatoid arthritis with rheumatoid factor of multiple sites without organ or systems involvement: Secondary | ICD-10-CM | POA: Diagnosis not present

## 2016-11-10 LAB — CBC WITH DIFFERENTIAL/PLATELET
Basophils Absolute: 87 cells/uL (ref 0–200)
Basophils Relative: 1 %
EOS ABS: 261 {cells}/uL (ref 15–500)
Eosinophils Relative: 3 %
HEMATOCRIT: 41.3 % (ref 35.0–45.0)
HEMOGLOBIN: 13.6 g/dL (ref 11.7–15.5)
LYMPHS PCT: 22 %
Lymphs Abs: 1914 cells/uL (ref 850–3900)
MCH: 31.1 pg (ref 27.0–33.0)
MCHC: 32.9 g/dL (ref 32.0–36.0)
MCV: 94.3 fL (ref 80.0–100.0)
MONO ABS: 696 {cells}/uL (ref 200–950)
MPV: 9.9 fL (ref 7.5–12.5)
Monocytes Relative: 8 %
NEUTROS PCT: 66 %
Neutro Abs: 5742 cells/uL (ref 1500–7800)
Platelets: 317 10*3/uL (ref 140–400)
RBC: 4.38 MIL/uL (ref 3.80–5.10)
RDW: 14.1 % (ref 11.0–15.0)
WBC: 8.7 10*3/uL (ref 3.8–10.8)

## 2016-11-10 LAB — COMPLETE METABOLIC PANEL WITH GFR
ALT: 22 U/L (ref 6–29)
AST: 19 U/L (ref 10–35)
Albumin: 4.2 g/dL (ref 3.6–5.1)
Alkaline Phosphatase: 77 U/L (ref 33–115)
BUN: 10 mg/dL (ref 7–25)
CHLORIDE: 105 mmol/L (ref 98–110)
CO2: 27 mmol/L (ref 20–31)
CREATININE: 0.73 mg/dL (ref 0.50–1.10)
Calcium: 9 mg/dL (ref 8.6–10.2)
GFR, Est African American: >89 mL/min (ref >=60)
GFR, Est Non African American: >89 mL/min (ref >=60)
GLUCOSE: 83 mg/dL (ref 65–99)
Potassium: 4 mmol/L (ref 3.5–5.3)
SODIUM: 138 mmol/L (ref 135–146)
Total Bilirubin: 0.5 mg/dL (ref 0.2–1.2)
Total Protein: 6.6 g/dL (ref 6.1–8.1)

## 2016-11-10 MED ORDER — METHOTREXATE 2.5 MG PO TABS: 20.0000 mg | ORAL_TABLET | ORAL | 0 refills | Status: DC

## 2016-11-10 MED ORDER — FOLIC ACID 1 MG PO TABS: 2.0000 mg | ORAL_TABLET | Freq: Every day | ORAL | 4 refills | Status: AC

## 2016-11-16 ENCOUNTER — Ambulatory Visit: Payer: Managed Care, Other (non HMO) | Admitting: Rheumatology

## 2016-11-18 NOTE — Progress Notes (Signed)
Tell patient:#1: CMP with GFR is normal#2: CBC with differential is normal#3: No change in treatment  #4: please send labs to PCP

## 2016-12-24 ENCOUNTER — Other Ambulatory Visit: Payer: Self-pay | Admitting: Adult Health

## 2016-12-31 ENCOUNTER — Other Ambulatory Visit: Payer: Self-pay | Admitting: *Deleted

## 2016-12-31 MED ORDER — METHOTREXATE 2.5 MG PO TABS: 20.0000 mg | ORAL_TABLET | ORAL | 0 refills | Status: AC

## 2016-12-31 NOTE — Telephone Encounter (Signed)
Refill request received via fax for MTX  Last Visit: 11/10/16 Next Visit: 03/16/17 Labs: 11/10/16 WNL  Okay to refill MTX?

## 2017-01-30 ENCOUNTER — Other Ambulatory Visit: Payer: Self-pay | Admitting: Rheumatology

## 2017-02-14 ENCOUNTER — Other Ambulatory Visit: Payer: Self-pay | Admitting: Rheumatology

## 2017-02-14 NOTE — Telephone Encounter (Signed)
Last Visit: 11/10/16 Next Visit: 03/16/17 Labs: 11/10/16 WNL Patient will update labs this week.   Okay to refill MTX?

## 2017-02-17 ENCOUNTER — Other Ambulatory Visit: Payer: Self-pay

## 2017-02-17 DIAGNOSIS — Z79899 Other long term (current) drug therapy: Secondary | ICD-10-CM

## 2017-02-17 LAB — COMPLETE METABOLIC PANEL WITH GFR
ALBUMIN: 4 g/dL (ref 3.6–5.1)
ALK PHOS: 74 U/L (ref 33–115)
ALT: 29 U/L (ref 6–29)
AST: 19 U/L (ref 10–35)
BILIRUBIN TOTAL: 0.4 mg/dL (ref 0.2–1.2)
BUN: 6 mg/dL — ABNORMAL LOW (ref 7–25)
CO2: 25 mmol/L (ref 20–31)
Calcium: 9.3 mg/dL (ref 8.6–10.2)
Chloride: 103 mmol/L (ref 98–110)
Creat: 0.68 mg/dL (ref 0.50–1.10)
Glucose, Bld: 71 mg/dL (ref 65–99)
Potassium: 4.3 mmol/L (ref 3.5–5.3)
Sodium: 137 mmol/L (ref 135–146)
TOTAL PROTEIN: 7.1 g/dL (ref 6.1–8.1)

## 2017-02-17 LAB — CBC WITH DIFFERENTIAL/PLATELET
BASOS ABS: 97 {cells}/uL (ref 0–200)
Basophils Relative: 1 %
EOS ABS: 291 {cells}/uL (ref 15–500)
Eosinophils Relative: 3 %
HEMATOCRIT: 41.8 % (ref 35.0–45.0)
HEMOGLOBIN: 13.8 g/dL (ref 11.7–15.5)
LYMPHS ABS: 2619 {cells}/uL (ref 850–3900)
LYMPHS PCT: 27 %
MCH: 31.7 pg (ref 27.0–33.0)
MCHC: 33 g/dL (ref 32.0–36.0)
MCV: 96.1 fL (ref 80.0–100.0)
MONO ABS: 873 {cells}/uL (ref 200–950)
MPV: 9.8 fL (ref 7.5–12.5)
Monocytes Relative: 9 %
NEUTROS PCT: 60 %
Neutro Abs: 5820 cells/uL (ref 1500–7800)
Platelets: 316 10*3/uL (ref 140–400)
RBC: 4.35 MIL/uL (ref 3.80–5.10)
RDW: 14.1 % (ref 11.0–15.0)
WBC: 9.7 10*3/uL (ref 3.8–10.8)

## 2017-02-19 NOTE — Progress Notes (Signed)
Labs are normal.

## 2017-02-25 ENCOUNTER — Other Ambulatory Visit: Payer: Self-pay | Admitting: Adult Health

## 2017-02-25 DIAGNOSIS — Z1231 Encounter for screening mammogram for malignant neoplasm of breast: Secondary | ICD-10-CM

## 2017-02-28 ENCOUNTER — Ambulatory Visit (HOSPITAL_COMMUNITY)
Admission: RE | Admit: 2017-02-28 | Discharge: 2017-02-28 | Disposition: A | Discharge status: Home / Self Care | Payer: Managed Care, Other (non HMO) | Source: Ambulatory Visit | Attending: Adult Health | Admitting: Adult Health

## 2017-02-28 DIAGNOSIS — Z1231 Encounter for screening mammogram for malignant neoplasm of breast: Secondary | ICD-10-CM | POA: Insufficient documentation

## 2017-03-16 ENCOUNTER — Ambulatory Visit (INDEPENDENT_AMBULATORY_CARE_PROVIDER_SITE_OTHER): Payer: Managed Care, Other (non HMO)

## 2017-03-16 ENCOUNTER — Encounter: Payer: Self-pay | Admitting: Rheumatology

## 2017-03-16 ENCOUNTER — Ambulatory Visit (INDEPENDENT_AMBULATORY_CARE_PROVIDER_SITE_OTHER): Payer: Managed Care, Other (non HMO) | Admitting: Rheumatology

## 2017-03-16 VITALS — BP 135/79 | HR 90 | Resp 13 | Ht 62.0 in | Wt 226.0 lb

## 2017-03-16 DIAGNOSIS — M25551 Pain in right hip: Secondary | ICD-10-CM | POA: Diagnosis not present

## 2017-03-16 DIAGNOSIS — M160 Bilateral primary osteoarthritis of hip: Secondary | ICD-10-CM

## 2017-03-16 DIAGNOSIS — M25552 Pain in left hip: Secondary | ICD-10-CM | POA: Diagnosis not present

## 2017-03-16 DIAGNOSIS — M0579 Rheumatoid arthritis with rheumatoid factor of multiple sites without organ or systems involvement: Secondary | ICD-10-CM | POA: Diagnosis not present

## 2017-03-16 DIAGNOSIS — Z79899 Other long term (current) drug therapy: Secondary | ICD-10-CM

## 2017-03-16 NOTE — Progress Notes (Signed)
Office Visit Note  Patient: [name]             Date of Birth: 12/22/1970           MRN: [id number]             PCP: Rosita Fire, MD Referring: Rosita Fire, MD Visit Date: 03/16/2017 Occupation: '@GUAROCC' @    Subjective:  Follow-up   History of Present Illness: [name] is a 46 y.o. female    Last seen in our office in 11/10/2016.  Patient has a history of rheumatoid arthritis. In the past she used to be noncompliant with medications. She's been doing very well lately and taking her methotrexate 8 pills per week on a regular basis along with folic acid 2 mg daily.  She has been doing well with her rheumatoid arthritis until mid March 2018. At that time, patient felt as if she had a flare for about 1 week about middle of march 2018 Both wrist felt like they were crunching.   No associated warmth, redness, swelling.  Based on her symptoms, it was difficult to decide whether patient was having a flare of rheumatoid arthritis or was it pain from osteoarthritis.  She also complains of left hip pain. She's been having this pain for a significant amount of time. When she complained about it back in 2015 we did a MRI which did not show any abnormalities to the left hip joint.  She states that it's hard to disturbance or from time to time but she has ongoing pain.    Activities of Daily Living:  Patient reports morning stiffness for 15 minutes.   Patient Denies nocturnal pain.  Difficulty dressing/grooming: Denies Difficulty climbing stairs: Denies Difficulty getting out of chair: Reports Difficulty using hands for taps, buttons, cutlery, and/or writing: Reports   Review of Systems  Constitutional: Negative for fatigue.  HENT: Negative for mouth sores and mouth dryness.   Eyes: Negative for dryness.  Respiratory: Negative for shortness of breath.   Gastrointestinal: Negative for constipation and diarrhea.  Musculoskeletal: Negative for myalgias  and myalgias.  Skin: Negative for sensitivity to sunlight.  Psychiatric/Behavioral: Negative for decreased concentration and sleep disturbance.    PMFS History:  Patient Active Problem List   Diagnosis Date Noted  . Rheumatoid arthritis with rheumatoid factor of multiple sites without organ or systems involvement ([place]) 11/09/2016  . High risk medication use 11/09/2016  . Sacroiliitis, not elsewhere classified ([place]) 11/09/2016  . Non compliance w medication regimen 11/09/2016  . Constipation 06/16/2016  . Obesity 05/16/2015  . IUD (intrauterine device) in place 03/20/2014    Past Medical History:  Diagnosis Date  . Arthritis   . BV (bacterial vaginosis)   . Constipation 06/16/2016  . HSV (herpes simplex virus) infection   . IUD (intrauterine device) in place 03/20/2014   IUD inserted 01/23/13  . Obesity   . RA (rheumatoid arthritis) ([place])   . Trichomonas     Family History  Problem Relation Age of Onset  . Diabetes Mother   . COPD Mother   . Arthritis Mother   . Hypertension Mother   . Heart disease Mother   . Asthma Son   . Cancer Maternal Grandmother     lung  . Heart disease Paternal Grandmother     CHF   Past Surgical History:  Procedure Laterality Date  . APPENDECTOMY    . CHOLECYSTECTOMY    . HAND SURGERY    . OVARIAN CYST REMOVAL N/A  08/17/2007  . TONSILLECTOMY     Social History   Social History Narrative  . No narrative on file     Objective: Vital Signs: BP 135/79 (BP Location: Left Arm, Patient Position: Sitting, Cuff Size: Normal)   Pulse 90   Resp 13   Ht '5\' 2"'  (1.575 m)   Wt 226 lb (102.5 kg)   BMI 41.34 kg/m    Physical Exam  Constitutional: She is oriented to person, place, and time. She appears well-developed and well-nourished.  HENT:  Head: Normocephalic and atraumatic.  Eyes: EOM are normal. Pupils are equal, round, and reactive to light.  Cardiovascular: Normal rate, regular rhythm and normal heart sounds.  Exam reveals no gallop  and no friction rub.   No murmur heard. Pulmonary/Chest: Effort normal and breath sounds normal. She has no wheezes. She has no rales.  Abdominal: Soft. Bowel sounds are normal. She exhibits no distension. There is no tenderness. There is no guarding. No hernia.  Musculoskeletal: Normal range of motion. She exhibits no edema, tenderness or deformity.  Lymphadenopathy:    She has no cervical adenopathy.  Neurological: She is alert and oriented to person, place, and time. Coordination normal.  Skin: Skin is warm and dry. Capillary refill takes less than 2 seconds. No rash noted.  Psychiatric: She has a normal mood and affect. Her behavior is normal.  Nursing note and vitals reviewed.    Musculoskeletal Exam:  Full range of motion of all joints Grip strength is equal and strong bilaterally Fibromyalgia tender points are all absent  CDAI Exam: CDAI Homunculus Exam:   Joint Counts:  CDAI Tender Joint count: 0 CDAI Swollen Joint count: 0  Global Assessments:  Patient Global Assessment: 4 Provider Global Assessment: 4  CDAI Calculated Score: 8  All joints are doing well at this time. No synovitis on examination  Investigation: No additional findings. Orders Only on 02/17/2017  Component Date Value Ref Range Status  . Sodium 02/17/2017 137  135 - 146 mmol/L Final  . Potassium 02/17/2017 4.3  3.5 - 5.3 mmol/L Final  . Chloride 02/17/2017 103  98 - 110 mmol/L Final  . CO2 02/17/2017 25  20 - 31 mmol/L Final  . Glucose, Bld 02/17/2017 71  65 - 99 mg/dL Final  . BUN 02/17/2017 6* 7 - 25 mg/dL Final  . Creat 02/17/2017 0.68  0.50 - 1.10 mg/dL Final  . Total Bilirubin 02/17/2017 0.4  0.2 - 1.2 mg/dL Final  . Alkaline Phosphatase 02/17/2017 74  33 - 115 U/L Final  . AST 02/17/2017 19  10 - 35 U/L Final  . ALT 02/17/2017 29  6 - 29 U/L Final  . Total Protein 02/17/2017 7.1  6.1 - 8.1 g/dL Final  . Albumin 02/17/2017 4.0  3.6 - 5.1 g/dL Final  . Calcium 02/17/2017 9.3  8.6 - 10.2  mg/dL Final  . GFR, Est African American 02/17/2017 >89  >=60 mL/min Final  . GFR, Est Non African American 02/17/2017 >89  >=60 mL/min Final  . WBC 02/17/2017 9.7  3.8 - 10.8 K/uL Final  . RBC 02/17/2017 4.35  3.80 - 5.10 MIL/uL Final  . Hemoglobin 02/17/2017 13.8  11.7 - 15.5 g/dL Final  . HCT 02/17/2017 41.8  35.0 - 45.0 % Final  . MCV 02/17/2017 96.1  80.0 - 100.0 fL Final  . MCH 02/17/2017 31.7  27.0 - 33.0 pg Final  . MCHC 02/17/2017 33.0  32.0 - 36.0 g/dL Final  . RDW 02/17/2017 14.1  11.0 - 15.0 % Final  . Platelets 02/17/2017 316  140 - 400 K/uL Final  . MPV 02/17/2017 9.8  7.5 - 12.5 fL Final  . Neutro Abs 02/17/2017 5820  1,500 - 7,800 cells/uL Final  . Lymphs Abs 02/17/2017 2619  850 - 3,900 cells/uL Final  . Monocytes Absolute 02/17/2017 873  200 - 950 cells/uL Final  . Eosinophils Absolute 02/17/2017 291  15 - 500 cells/uL Final  . Basophils Absolute 02/17/2017 97  0 - 200 cells/uL Final  . Neutrophils Relative % 02/17/2017 60  % Final  . Lymphocytes Relative 02/17/2017 27  % Final  . Monocytes Relative 02/17/2017 9  % Final  . Eosinophils Relative 02/17/2017 3  % Final  . Basophils Relative 02/17/2017 1  % Final  . Smear Review 02/17/2017 Criteria for review not met   Final  Office Visit on 11/10/2016  Component Date Value Ref Range Status  . Sodium 11/10/2016 138  135 - 146 mmol/L Final  . Potassium 11/10/2016 4.0  3.5 - 5.3 mmol/L Final  . Chloride 11/10/2016 105  98 - 110 mmol/L Final  . CO2 11/10/2016 27  20 - 31 mmol/L Final  . Glucose, Bld 11/10/2016 83  65 - 99 mg/dL Final  . BUN 11/10/2016 10  7 - 25 mg/dL Final  . Creat 11/10/2016 0.73  0.50 - 1.10 mg/dL Final  . Total Bilirubin 11/10/2016 0.5  0.2 - 1.2 mg/dL Final  . Alkaline Phosphatase 11/10/2016 77  33 - 115 U/L Final  . AST 11/10/2016 19  10 - 35 U/L Final  . ALT 11/10/2016 22  6 - 29 U/L Final  . Total Protein 11/10/2016 6.6  6.1 - 8.1 g/dL Final  . Albumin 11/10/2016 4.2  3.6 - 5.1 g/dL Final  .  Calcium 11/10/2016 9.0  8.6 - 10.2 mg/dL Final  . GFR, Est African American 11/10/2016 >89  >=60 mL/min Final  . GFR, Est Non African American 11/10/2016 >89  >=60 mL/min Final  . WBC 11/10/2016 8.7  3.8 - 10.8 K/uL Final  . RBC 11/10/2016 4.38  3.80 - 5.10 MIL/uL Final  . Hemoglobin 11/10/2016 13.6  11.7 - 15.5 g/dL Final  . HCT 11/10/2016 41.3  35.0 - 45.0 % Final  . MCV 11/10/2016 94.3  80.0 - 100.0 fL Final  . MCH 11/10/2016 31.1  27.0 - 33.0 pg Final  . MCHC 11/10/2016 32.9  32.0 - 36.0 g/dL Final  . RDW 11/10/2016 14.1  11.0 - 15.0 % Final  . Platelets 11/10/2016 317  140 - 400 K/uL Final  . MPV 11/10/2016 9.9  7.5 - 12.5 fL Final  . Neutro Abs 11/10/2016 5742  1,500 - 7,800 cells/uL Final  . Lymphs Abs 11/10/2016 1914  850 - 3,900 cells/uL Final  . Monocytes Absolute 11/10/2016 696  200 - 950 cells/uL Final  . Eosinophils Absolute 11/10/2016 261  15 - 500 cells/uL Final  . Basophils Absolute 11/10/2016 87  0 - 200 cells/uL Final  . Neutrophils Relative % 11/10/2016 66  % Final  . Lymphocytes Relative 11/10/2016 22  % Final  . Monocytes Relative 11/10/2016 8  % Final  . Eosinophils Relative 11/10/2016 3  % Final  . Basophils Relative 11/10/2016 1  % Final  . Smear Review 11/10/2016 Criteria for review not met   Final     Imaging: Mm Digital Screening Bilateral  Result Date: 03/01/2017 CLINICAL DATA:  Screening. EXAM: DIGITAL SCREENING BILATERAL MAMMOGRAM WITH CAD COMPARISON:  Previous exam(s). ACR  Breast Density Category b: There are scattered areas of fibroglandular density. FINDINGS: There are no findings suspicious for malignancy. Images were processed with CAD. IMPRESSION: No mammographic evidence of malignancy. A result letter of this screening mammogram will be mailed directly to the patient. RECOMMENDATION: Screening mammogram in one year. (Code:SM-B-01Y) BI-RADS CATEGORY  1: Negative. Electronically Signed   By: Nolon Nations M.D.   On: 03/01/2017 12:15   Xr Hips  Bilat W Or W/o Pelvis 2v  Result Date: 03/16/2017 #1: Both hips shows mild/joint space narrowing in the superior aspect of the joint. #2: No other abnormalities noted Impression #1: Osteoarthritis of bilateral hips with left slightly worse than right       Speciality Comments: No specialty comments available.    Procedures:  No procedures performed Allergies: Contrast media [iodinated diagnostic agents]   Assessment / Plan:     Visit Diagnoses: Rheumatoid arthritis with rheumatoid factor of multiple sites without organ or systems involvement (HCC) - +RF +CCP  High risk medication use - Methotrexate 8/wk  Pain in left hip  Bilateral hip pain - Plan: XR HIPS BILAT W OR W/O PELVIS 2V  Bilateral primary osteoarthritis of hip - 03/16/2017: X-ray of bilateral hips today show mild/moderate osteoarthritis of bilateral hips with left worse than right   Plan: #1: Rheumatoid Arthritis Positive rheumatoid factor positive CCP. Taking methotrexate 8 per week Folic acid 2 mg per day Had a flare middle of March to the right wrist with mild discomfort. No warmth swelling or redness at that time. Possibly osteoarthritis pain and really not rheumatoid arthritis flare.   #2: High risk prescription Methotrexate 8 per week Folic acid 2 mg daily Although patient had some pain to the right wrist approximately middle March and lasted for about a week, it may be more osteoarthritic pain in a flare of rheumatoid arthritis we will monitor carefully. Patient was advised to let us know if she has warmth, swelling, or redness.  #3: Left hip pain. MRI was done in 2015. Please see for full details. X-rays done today of the left hip shows mild/moderate osteoarthritis of left greater than right hip.   #4: Physical therapy; left hip pain; mild to moderate osteoarthritis of left hip slightly worse in the right hip  #5: History of MRI of the left hip.  #6: Return to clinic in 6 months  #7: Work  note she'll patient was in our office today.  #8: Patient will need CBC with differential and CMP with GFR from today and then every 3 months  Orders: Orders Placed This Encounter  Procedures  . XR HIPS BILAT W OR W/O PELVIS 2V   No orders of the defined types were placed in this encounter.   Face-to-face time spent with patient was 30 minutes. 50% of time was spent in counseling and coordination of care.  Follow-Up Instructions: Return in about 6 months (around 09/15/2017) for RA,mtx 8/wk, folic 65m qd, left hip pain, mild/mod oa hip joint, phys therapy.   NEliezer Lofts PA-C  Note - This record has been created using [employer]  Chart creation errors have been sought, but may not always  have been located. Such creation errors do not reflect on  the standard of medical care.

## 2017-06-09 ENCOUNTER — Other Ambulatory Visit: Payer: Self-pay | Admitting: Adult Health

## 2017-06-10 ENCOUNTER — Ambulatory Visit: Payer: Managed Care, Other (non HMO) | Admitting: Rheumatology

## 2017-06-17 ENCOUNTER — Ambulatory Visit (INDEPENDENT_AMBULATORY_CARE_PROVIDER_SITE_OTHER): Payer: Managed Care, Other (non HMO) | Admitting: Rheumatology

## 2017-06-17 ENCOUNTER — Encounter: Payer: Self-pay | Admitting: *Deleted

## 2017-06-17 ENCOUNTER — Encounter: Payer: Self-pay | Admitting: Rheumatology

## 2017-06-17 VITALS — BP 121/55 | HR 85 | Resp 14 | Ht 62.0 in | Wt 224.0 lb

## 2017-06-17 DIAGNOSIS — Z79899 Other long term (current) drug therapy: Secondary | ICD-10-CM | POA: Diagnosis not present

## 2017-06-17 DIAGNOSIS — E669 Obesity, unspecified: Secondary | ICD-10-CM | POA: Diagnosis not present

## 2017-06-17 DIAGNOSIS — Z6841 Body Mass Index (BMI) 40.0 and over, adult: Secondary | ICD-10-CM | POA: Diagnosis not present

## 2017-06-17 DIAGNOSIS — Z9114 Patient's other noncompliance with medication regimen: Secondary | ICD-10-CM

## 2017-06-17 DIAGNOSIS — M0579 Rheumatoid arthritis with rheumatoid factor of multiple sites without organ or systems involvement: Secondary | ICD-10-CM

## 2017-06-17 DIAGNOSIS — IMO0001 Reserved for inherently not codable concepts without codable children: Secondary | ICD-10-CM

## 2017-06-17 DIAGNOSIS — M25571 Pain in right ankle and joints of right foot: Secondary | ICD-10-CM

## 2017-06-17 LAB — CBC WITH DIFFERENTIAL/PLATELET
BASOS ABS: 91 {cells}/uL (ref 0–200)
Basophils Relative: 1 %
EOS PCT: 2 %
Eosinophils Absolute: 182 cells/uL (ref 15–500)
HCT: 41.3 % (ref 35.0–45.0)
HEMOGLOBIN: 13.7 g/dL (ref 11.7–15.5)
LYMPHS ABS: 2184 {cells}/uL (ref 850–3900)
Lymphocytes Relative: 24 %
MCH: 31.6 pg (ref 27.0–33.0)
MCHC: 33.2 g/dL (ref 32.0–36.0)
MCV: 95.4 fL (ref 80.0–100.0)
MONOS PCT: 8 %
MPV: 9.6 fL (ref 7.5–12.5)
Monocytes Absolute: 728 cells/uL (ref 200–950)
NEUTROS ABS: 5915 {cells}/uL (ref 1500–7800)
Neutrophils Relative %: 65 %
PLATELETS: 337 10*3/uL (ref 140–400)
RBC: 4.33 MIL/uL (ref 3.80–5.10)
RDW: 13.7 % (ref 11.0–15.0)
WBC: 9.1 10*3/uL (ref 3.8–10.8)

## 2017-06-17 MED ORDER — DICLOFENAC SODIUM 1 % TD GEL: TRANSDERMAL | 3 refills | Status: DC

## 2017-06-17 NOTE — Progress Notes (Signed)
Office Visit Note  Patient: [name]             Date of Birth: 1970/12/20           MRN: [id number]             PCP: Rosita Fire, MD Referring: Rosita Fire, MD Visit Date: 06/17/2017 Occupation: '@GUAROCC' @    Subjective:  No chief complaint on file. back pain/stiffness x 2 1/2 weeks --> all better currently. C/0 left knee pain radiating to left hip "6" on scale of 0-10  History of Present Illness: RYNLEE [name] is a 46 y.o. female  Was last seen 03/16/2017 for rheumatoid arthritis (positive CCP) and high risk (methotrexate 8 per week and folic acid 2 per day). On the last visit, in the middle of March, patient complained of flare to the right wrist prescription   Patient presents today because she was having some back pain and stiffness about 2-1/2 weeks ago. Unfortunately, she was unable to see [place] at the time that pain was going on because our schedule was full. She states that gradually the pain did resolve and it did get better. She denies urinary symptoms, fever, injuries, falls. She is unable to recall what might have set off her back and cause her to have the pain.     Activities of Daily Living:  Patient reports morning stiffness for 15 minutes.   Patient Denies nocturnal pain.  Difficulty dressing/grooming: Denies Difficulty climbing stairs: Denies Difficulty getting out of chair: Denies Difficulty using hands for taps, buttons, cutlery, and/or writing: Denies   Review of Systems  Constitutional: Negative for fatigue.  HENT: Negative for mouth sores and mouth dryness.   Eyes: Negative for dryness.  Respiratory: Negative for shortness of breath.   Gastrointestinal: Negative for constipation and diarrhea.  Musculoskeletal: Negative for myalgias and myalgias.  Skin: Negative for sensitivity to sunlight.  Psychiatric/Behavioral: Negative for decreased concentration and sleep disturbance.    PMFS History:  Patient Active Problem List   Diagnosis Date Noted  . Rheumatoid arthritis with rheumatoid factor of multiple sites without organ or systems involvement ([place]) 11/09/2016  . High risk medication use 11/09/2016  . Sacroiliitis, not elsewhere classified ([place]) 11/09/2016  . Non compliance w medication regimen 11/09/2016  . Constipation 06/16/2016  . Obesity 05/16/2015  . IUD (intrauterine device) in place 03/20/2014    Past Medical History:  Diagnosis Date  . Arthritis   . BV (bacterial vaginosis)   . Constipation 06/16/2016  . HSV (herpes simplex virus) infection   . IUD (intrauterine device) in place 03/20/2014   IUD inserted 01/23/13  . Obesity   . RA (rheumatoid arthritis) ([place])   . Trichomonas     Family History  Problem Relation Age of Onset  . Diabetes Mother   . COPD Mother   . Arthritis Mother   . Hypertension Mother   . Heart disease Mother   . Asthma Son   . Cancer Maternal Grandmother        lung  . Heart disease Paternal Grandmother        CHF   Past Surgical History:  Procedure Laterality Date  . APPENDECTOMY    . CHOLECYSTECTOMY    . HAND SURGERY    . OVARIAN CYST REMOVAL N/A 08/17/2007  . TONSILLECTOMY     Social History   Social History Narrative  . No narrative on file     Objective: Vital Signs: BP (!) 121/55   Pulse 85  Resp 14   Ht '5\' 2"'  (1.575 m)   Wt 224 lb (101.6 kg)   BMI 40.97 kg/m    Physical Exam  Constitutional: She is oriented to person, place, and time. She appears well-developed and well-nourished.  HENT:  Head: Normocephalic and atraumatic.  Eyes: Pupils are equal, round, and reactive to light. EOM are normal.  Cardiovascular: Normal rate, regular rhythm and normal heart sounds.  Exam reveals no gallop and no friction rub.   No murmur heard. Pulmonary/Chest: Effort normal and breath sounds normal. She has no wheezes. She has no rales.  Abdominal: Soft. Bowel sounds are normal. She exhibits no distension. There is no tenderness. There is no guarding. No  hernia.  Musculoskeletal: Normal range of motion. She exhibits no edema, tenderness or deformity.  Lymphadenopathy:    She has no cervical adenopathy.  Neurological: She is alert and oriented to person, place, and time. Coordination normal.  Skin: Skin is warm and dry. Capillary refill takes less than 2 seconds. No rash noted.  Psychiatric: She has a normal mood and affect. Her behavior is normal.  Nursing note and vitals reviewed.    Musculoskeletal Exam:  Full range of motion of all joints Grip strength is equal and strong bilaterally Fiber myalgia tender points are all absent  CDAI Exam: CDAI Homunculus Exam:   Joint Counts:  CDAI Tender Joint count: 0 CDAI Swollen Joint count: 0  Global Assessments:  Patient Global Assessment: 6 Provider Global Assessment: 6  CDAI Calculated Score: 12 Back pain fully resolved. She describes the pain in the back as 0 on a scale of 0-10 Her main complaint today is her left knee pain. That pain is rated 6 on a scale of 0-10. She thinks that some of that pain in the knee is radiating to the left lateral hip (at left greater trochanteric bursa).   Investigation: No additional findings. Orders Only on 02/17/2017  Component Date Value Ref Range Status  . Sodium 02/17/2017 137  135 - 146 mmol/L Final  . Potassium 02/17/2017 4.3  3.5 - 5.3 mmol/L Final  . Chloride 02/17/2017 103  98 - 110 mmol/L Final  . CO2 02/17/2017 25  20 - 31 mmol/L Final  . Glucose, Bld 02/17/2017 71  65 - 99 mg/dL Final  . BUN 02/17/2017 6* 7 - 25 mg/dL Final  . Creat 02/17/2017 0.68  0.50 - 1.10 mg/dL Final  . Total Bilirubin 02/17/2017 0.4  0.2 - 1.2 mg/dL Final  . Alkaline Phosphatase 02/17/2017 74  33 - 115 U/L Final  . AST 02/17/2017 19  10 - 35 U/L Final  . ALT 02/17/2017 29  6 - 29 U/L Final  . Total Protein 02/17/2017 7.1  6.1 - 8.1 g/dL Final  . Albumin 02/17/2017 4.0  3.6 - 5.1 g/dL Final  . Calcium 02/17/2017 9.3  8.6 - 10.2 mg/dL Final  . GFR, Est  African American 02/17/2017 >89  >=60 mL/min Final  . GFR, Est Non African American 02/17/2017 >89  >=60 mL/min Final  . WBC 02/17/2017 9.7  3.8 - 10.8 K/uL Final  . RBC 02/17/2017 4.35  3.80 - 5.10 MIL/uL Final  . Hemoglobin 02/17/2017 13.8  11.7 - 15.5 g/dL Final  . HCT 02/17/2017 41.8  35.0 - 45.0 % Final  . MCV 02/17/2017 96.1  80.0 - 100.0 fL Final  . MCH 02/17/2017 31.7  27.0 - 33.0 pg Final  . MCHC 02/17/2017 33.0  32.0 - 36.0 g/dL Final  . RDW 02/17/2017  14.1  11.0 - 15.0 % Final  . Platelets 02/17/2017 316  140 - 400 K/uL Final  . MPV 02/17/2017 9.8  7.5 - 12.5 fL Final  . Neutro Abs 02/17/2017 5820  1,500 - 7,800 cells/uL Final  . Lymphs Abs 02/17/2017 2619  850 - 3,900 cells/uL Final  . Monocytes Absolute 02/17/2017 873  200 - 950 cells/uL Final  . Eosinophils Absolute 02/17/2017 291  15 - 500 cells/uL Final  . Basophils Absolute 02/17/2017 97  0 - 200 cells/uL Final  . Neutrophils Relative % 02/17/2017 60  % Final  . Lymphocytes Relative 02/17/2017 27  % Final  . Monocytes Relative 02/17/2017 9  % Final  . Eosinophils Relative 02/17/2017 3  % Final  . Basophils Relative 02/17/2017 1  % Final  . Smear Review 02/17/2017 Criteria for review not met   Final     Imaging: No results found.  Speciality Comments: No specialty comments available.    Procedures:  No procedures performed Allergies: Contrast media [iodinated diagnostic agents]   Assessment / Plan:     Visit Diagnoses: Rheumatoid arthritis with rheumatoid factor of multiple sites without organ or systems involvement (HCC)  High risk medication use - Plan: CBC with Differential/Platelet, COMPLETE METABOLIC PANEL WITH GFR  Non compliance w medication regimen  Class 3 obesity without serious comorbidity with body mass index (BMI) of 40.0 to 44.9 in adult, unspecified obesity type (HCC)  Toe joint pain, right - Right fifth toe with severe pain 2-3 weeks ago. - Plan: Uric acid   Plan: #1: Rheumatoid  arthritis. Patient is doing well at the moment. Taking her methotrexate 8 pills per week and folic acid 2 mg daily as prescribed without any problems. In the past, patient was noncompliant with her medications but she is now compliant.  #2: High risk prescription. Adequate control with methotrexate 8 per week and folic acid 2 per day Last labs were March 2018 and were within normal limits. She is due for repeat labs which we will get today. CBC with differential and CMP with GFR  #3: Low back pain midline approximately L4-L5. Patient does not recall any injuries. The pain was rated 10 on a scale of 0-10 when she had about 2-3 weeks ago. She was unable to see [place] in the office because her schedule was full. She is earliest appointment she could make was for today. At this visit, patient states that her pain is all gone. She also had some left greater trochanter bursa pain but that also improved at this time.  #4: Patient reports that the right fifth toe had severe pain about 2-3 weeks ago. She states that just touching it likely would cause her excruciating pain. I will check her uric acid to confirm that she does not have elevated uric acid.  #5: Today, patient has no back pain nor does she have any toe pain. She only has left knee pain.  #6: Complaining of left knee pain. No falls or injuries. Discomfort as 6 on a scale of 0-10. Some of the pain is causing her left greater trochanteric bursa area to also heard.   Orders: Orders Placed This Encounter  Procedures  . CBC with Differential/Platelet  . COMPLETE METABOLIC PANEL WITH GFR  . Uric acid   Meds ordered this encounter  Medications  . diclofenac sodium (VOLTAREN) 1 % GEL    Sig: Voltaren Gel 3 grams to 3 large joints upto TID 3 TUBES with 3 refills  Dispense:  3 Tube    Refill:  3    Voltaren Gel 3 grams to 3 large joints upto TID 3 TUBES with 3 refills    Order Specific Question:   Supervising Provider    Answer:    Bo Merino [phone]    Face-to-face time spent with patient was 30 minutes. Greater than 50% of time was spent in counseling and coordination of care.  Follow-Up Instructions: Return in about 5 months (around 11/17/2017).   Eliezer Lofts, PA-C  Note - This record has been created using [employer].  Chart creation errors have been sought, but may not always  have been located. Such creation errors do not reflect on  the standard of medical care.

## 2017-06-17 NOTE — Patient Instructions (Signed)

## 2017-06-18 LAB — COMPLETE METABOLIC PANEL WITH GFR
ALT: 21 U/L (ref 6–29)
AST: 20 U/L (ref 10–35)
Albumin: 4.5 g/dL (ref 3.6–5.1)
Alkaline Phosphatase: 75 U/L (ref 33–115)
BUN: 9 mg/dL (ref 7–25)
CHLORIDE: 100 mmol/L (ref 98–110)
CO2: 19 mmol/L — AB (ref 20–31)
Calcium: 9.3 mg/dL (ref 8.6–10.2)
Creat: 0.86 mg/dL (ref 0.50–1.10)
GFR, EST NON AFRICAN AMERICAN: 81 mL/min (ref >=60)
GLUCOSE: 74 mg/dL (ref 65–99)
POTASSIUM: 4.3 mmol/L (ref 3.5–5.3)
SODIUM: 136 mmol/L (ref 135–146)
TOTAL PROTEIN: 7.4 g/dL (ref 6.1–8.1)
Total Bilirubin: 0.5 mg/dL (ref 0.2–1.2)

## 2017-06-18 LAB — URIC ACID: Uric Acid, Serum: 3.3 mg/dL (ref 2.5–7.0)

## 2017-06-20 NOTE — Progress Notes (Signed)
WNL

## 2017-07-04 NOTE — Progress Notes (Signed)
Entered in error

## 2017-07-14 ENCOUNTER — Encounter: Payer: Self-pay | Admitting: Adult Health

## 2017-07-14 ENCOUNTER — Ambulatory Visit (INDEPENDENT_AMBULATORY_CARE_PROVIDER_SITE_OTHER): Payer: 59 | Admitting: Adult Health

## 2017-07-14 VITALS — BP 130/88 | HR 74 | Ht 62.25 in | Wt 217.5 lb

## 2017-07-14 DIAGNOSIS — Z1211 Encounter for screening for malignant neoplasm of colon: Secondary | ICD-10-CM | POA: Diagnosis not present

## 2017-07-14 DIAGNOSIS — Z975 Presence of (intrauterine) contraceptive device: Secondary | ICD-10-CM | POA: Diagnosis not present

## 2017-07-14 DIAGNOSIS — K5900 Constipation, unspecified: Secondary | ICD-10-CM

## 2017-07-14 DIAGNOSIS — Z1212 Encounter for screening for malignant neoplasm of rectum: Secondary | ICD-10-CM

## 2017-07-14 DIAGNOSIS — Z01419 Encounter for gynecological examination (general) (routine) without abnormal findings: Secondary | ICD-10-CM

## 2017-07-14 DIAGNOSIS — L918 Other hypertrophic disorders of the skin: Secondary | ICD-10-CM

## 2017-07-14 LAB — HEMOCCULT GUIAC POC 1CARD (OFFICE): Fecal Occult Blood, POC: NEGATIVE

## 2017-07-14 NOTE — Patient Instructions (Addendum)
Physical and pap in 1 year  Mammogram yearly

## 2017-07-14 NOTE — Progress Notes (Signed)
Patient ID: [name], female   DOB: 01-14-1971, 46 y.o.   MRN: [id number] History of Present Illness: [name] is a 46 year old black female in for well woman gyn exam, she had normal pap with negative HPV 05/16/15.She has IUD and is happy with it, is due to be replaced next March. PCP is Dr Legrand Rams.    Current Medications, Allergies, Past Medical History, Past Surgical History, Family History and Social History were reviewed in [employer] record.     Review of Systems: Patient denies any headaches, hearing loss, fatigue, blurred vision, shortness of breath, chest pain, abdominal pain, problems with urination, or intercourse. No joint pain or mood swings. +constipation, but better    Physical Exam:BP 130/88 (BP Location: Left Arm, Patient Position: Sitting, Cuff Size: Large)   Pulse 74   Ht 5' 2.25" (1.581 m)   Wt 217 lb 8 oz (98.7 kg)   LMP 07/04/2017 (Approximate)   BMI 39.46 kg/m  General:  Well developed, well nourished, no acute distress Skin:  Warm and dry Neck:  Midline trachea, normal thyroid, good ROM, no lymphadenopathy Lungs; Clear to auscultation bilaterally Breast:  No dominant palpable mass, retraction, or nipple discharge Cardiovascular: Regular rate and rhythm Abdomen:  Soft, non tender, no hepatosplenomegaly Pelvic:  External genitalia is normal in appearance, skin tag right inner thigh.  The vagina is normal in appearance. Urethra has no lesions or masses. The cervix is bulbous.+IUD strings.  Uterus is felt to be normal size, shape, and contour.  No adnexal masses or tenderness noted.Bladder is non tender, no masses felt. Rectal: Good sphincter tone, no polyps, or hemorrhoids felt.  Hemoccult negative. Extremities/musculoskeletal:  No swelling or varicosities noted, no clubbing or cyanosis Psych:  No mood changes, alert and cooperative,seems happy PHQ 2 score 0.  Impression: 1. Well woman exam with routine gynecological exam   2. IUD  (intrauterine device) in place   3. Screening for colorectal cancer   4. Constipation, unspecified constipation type   5. Skin tag       Plan: Physical and pap in 1 year Return in end of  February for IUD removal and reinsertion Mammogram yearly

## 2017-08-02 ENCOUNTER — Other Ambulatory Visit: Payer: Self-pay | Admitting: Rheumatology

## 2017-08-02 NOTE — Telephone Encounter (Signed)
Last Visit: 06/17/17 Next Visit: 11/23/17 Labs: 06/17/17 WNL  Okay to refill per Dr. Estanislado Pandy

## 2017-09-11 ENCOUNTER — Ambulatory Visit (HOSPITAL_COMMUNITY)
Admission: EM | Admit: 2017-09-11 | Discharge: 2017-09-11 | Disposition: A | Discharge status: Home / Self Care | Payer: 59 | Attending: Emergency Medicine | Admitting: Emergency Medicine

## 2017-09-11 ENCOUNTER — Encounter (HOSPITAL_COMMUNITY): Payer: Self-pay | Admitting: Emergency Medicine

## 2017-09-11 DIAGNOSIS — J069 Acute upper respiratory infection, unspecified: Secondary | ICD-10-CM

## 2017-09-11 DIAGNOSIS — R0981 Nasal congestion: Secondary | ICD-10-CM

## 2017-09-11 DIAGNOSIS — R0982 Postnasal drip: Secondary | ICD-10-CM

## 2017-09-11 MED ORDER — PREDNISONE 50 MG PO TABS: ORAL_TABLET | ORAL | 0 refills | Status: DC

## 2017-09-11 NOTE — Discharge Instructions (Signed)
Sudafed PE 10 mg every 4 to 6 hours as needed for congestion Allegra or Zyrtec daily as needed for drainage and runny nose. For stronger antihistamine may take Chlor-Trimeton 2 to 4 mg every 4 to 6 hours, may cause drowsiness.S Saline nasal spray used frequently. Drink plenty of fluids and stay well-hydrated. Flonase or Rhinocort nasal spray daily

## 2017-09-11 NOTE — ED Triage Notes (Signed)
Pt c/o cold sx onset 2 weeks ago associated w/facial pressure, dizziness,   Denies fevers, cough, nasal congestion/drainage  A&O x4... NAD... Ambulatory

## 2017-09-11 NOTE — ED Provider Notes (Signed)
[facility]    CSN: [id number] Arrival date & time: 09/11/17  1203     History   Chief Complaint Chief Complaint  Patient presents with  . URI    HPI [name] is a 46 y.o. female.   46 year old female complaining of facial pressure, nasal congestion, PND and clearing her throat for the past couple weeks. taking Zyrtec and Flonase only. No decongestant ort other medications. She has had no fever.      Past Medical History:  Diagnosis Date  . Arthritis   . BV (bacterial vaginosis)   . Constipation 06/16/2016  . HSV (herpes simplex virus) infection   . IUD (intrauterine device) in place 03/20/2014   IUD inserted 01/23/13  . Obesity   . Osteoarthritis   . RA (rheumatoid arthritis) ([place])   . Trichomonas     Patient Active Problem List   Diagnosis Date Noted  . Well woman exam with routine gynecological exam 07/14/2017  . Skin tag 07/14/2017  . Rheumatoid arthritis with rheumatoid factor of multiple sites without organ or systems involvement ([place]) 11/09/2016  . High risk medication use 11/09/2016  . Sacroiliitis, not elsewhere classified ([place]) 11/09/2016  . Non compliance w medication regimen 11/09/2016  . Constipation 06/16/2016  . Obesity 05/16/2015  . IUD (intrauterine device) in place 03/20/2014    Past Surgical History:  Procedure Laterality Date  . APPENDECTOMY    . CHOLECYSTECTOMY    . HAND SURGERY    . OVARIAN CYST REMOVAL N/A 08/17/2007  . TONSILLECTOMY      OB History    Gravida Para Term Preterm AB Living   2 1 1   1 1    SAB TAB Ectopic Multiple Live Births     1     1       Home Medications    Prior to Admission medications   Medication Sig Start Date End Date Taking? Authorizing Provider  Apple Cider Vinegar 188 MG CAPS Take by mouth daily.    Yes [provider]  BIOTIN PO Take by mouth at bedtime.   Yes [provider]  Biotin w/ Vitamins C & E (HAIR SKIN & NAILS GUMMIES PO) Take by mouth. Chews 2  daily   Yes [provider]  Capsicum, Cayenne, (CAYENNE PO) Take by mouth daily.   Yes [provider]  cetirizine (ZYRTEC) 10 MG tablet Take 1 tablet (10 mg total) by mouth daily. One tab daily for allergies 08/20/16  Yes Kindl, Nelda Severe, MD  Cinnamon 500 MG TABS Take by mouth daily.    Yes [provider]  Flaxseed, Linseed, (FLAX SEED OIL) 1000 MG CAPS Take by mouth daily.    Yes [provider]  folic acid (FOLVITE) 1 MG tablet Take 2 tablets (2 mg total) by mouth daily. Takes 2 pills once daily. 11/10/16 02/03/18 Yes Panwala, Naitik, PA-C  levonorgestrel (MIRENA) 20 MCG/24HR IUD 1 each by Intrauterine route once.   Yes [provider]  MAGNESIUM PO Take 800 mg by mouth daily.   Yes [provider]  methotrexate (RHEUMATREX) 2.5 MG tablet Take by mouth once a week. Caution:Chemotherapy. Protect from light. Takes 8 tabs once a week   Yes [provider]  methotrexate (RHEUMATREX) 2.5 MG tablet TAKE 8 TABLETS (20 MG TOTAL) BY MOUTH ONCE A WEEK. CAUTION:CHEMOTHERAPY. PROTECT FROM LIGHT. 08/02/17 10/31/17 Yes Deveshwar, Abel Presto, MD  Misc Natural Products (TART CHERRY ADVANCED PO) Take by mouth daily.    Yes  [provider]  Multiple Vitamins-Calcium (ONE-A-DAY WOMENS PO) Take by mouth daily.   Yes [provider]  Phentermine HCl (ADIPEX-P PO) Take by mouth daily.   Yes [provider]  diclofenac sodium (VOLTAREN) 1 % GEL Voltaren Gel 3 grams to 3 large joints upto TID 3 TUBES with 3 refills Patient taking differently: as needed. Voltaren Gel 3 grams to 3 large joints upto TID 3 TUBES with 3 refills 06/17/17   Panwala, Naitik, PA-C  predniSONE (DELTASONE) 50 MG tablet 1 tab po daily for 6 days. Take with food. 09/11/17   Janne Napoleon, NP  valACYclovir (VALTREX) 500 MG tablet TAKE 1 TABLET BY MOUTH EVERY DAY Patient taking differently: TAKE 1 TABLET BY MOUTH EVERY DAY PRN 06/09/17   Estill Dooms, NP     Family History Family History  Problem Relation Age of Onset  . Diabetes Mother   . COPD Mother   . Arthritis Mother   . Hypertension Mother   . Heart disease Mother   . Asthma Son   . Cancer Maternal Grandmother        lung  . Heart disease Paternal Grandmother        CHF    Social History Social History  Substance Use Topics  . Smoking status: Never Smoker  . Smokeless tobacco: Never Used  . Alcohol use Yes     Comment: rarely     Allergies   Contrast media [iodinated diagnostic agents]   Review of Systems Review of Systems  Constitutional: Negative for activity change, appetite change, chills, fatigue and fever.  HENT: Positive for congestion and postnasal drip. Negative for facial swelling, rhinorrhea and sore throat.   Eyes: Negative.   Respiratory: Negative.   Cardiovascular: Negative.   Musculoskeletal: Negative for neck pain and neck stiffness.  Skin: Negative for pallor and rash.  Neurological: Negative.   All other systems reviewed and are negative.    Physical Exam Triage Vital Signs ED Triage Vitals [09/11/17 1236]  Enc Vitals Group     BP 126/80     Pulse Rate 86     Resp 18     Temp 97.8 F (36.6 C)     Temp Source Oral     SpO2 99 %     Weight      Height      Head Circumference      Peak Flow      Pain Score      Pain Loc      Pain Edu?      Excl. in [place]?    No data found.   Updated Vital Signs BP 126/80 (BP Location: Left Arm)   Pulse 86   Temp 97.8 F (36.6 C) (Oral)   Resp 18   SpO2 99%   Visual Acuity Right Eye Distance:   Left Eye Distance:   Bilateral Distance:    Right Eye Near:   Left Eye Near:    Bilateral Near:     Physical Exam  Constitutional: She is oriented to person, place, and time. She appears well-developed and well-nourished. No distress.  HENT:  Mouth/Throat: No oropharyngeal exudate.  Bilateral TMs are pearly gray, transparent and retracted. No effusion or erythema.  Oropharynx with  minimal erythema and mild amount of PND, clear. She is frequently clearing her throat.  Eyes: EOM are normal.  Neck: Normal range of motion. Neck supple.  Cardiovascular: Normal rate, regular rhythm and normal heart sounds.   Pulmonary/Chest: Effort  normal and breath sounds normal. No respiratory distress. She has no wheezes. She has no rales.  Musculoskeletal: Normal range of motion. She exhibits no edema.  Lymphadenopathy:    She has no cervical adenopathy.  Neurological: She is alert and oriented to person, place, and time.  Skin: Skin is warm and dry. No rash noted.  Psychiatric: She has a normal mood and affect.  Nursing note and vitals reviewed.    UC Treatments / Results  Labs (all labs ordered are listed, but only abnormal results are displayed) Labs Reviewed - No data to display  EKG  EKG Interpretation None       Radiology No results found.  Procedures Procedures (including critical care time)  Medications Ordered in UC Medications - No data to display   Initial Impression / Assessment and Plan / UC Course  I have reviewed the triage vital signs and the nursing notes.  Pertinent labs & imaging results that were available during my care of the patient were reviewed by me and considered in my medical decision making (see chart for details).    Sudafed PE 10 mg every 4 to 6 hours as needed for congestion Allegra or Zyrtec daily as needed for drainage and runny nose. For stronger antihistamine may take Chlor-Trimeton 2 to 4 mg every 4 to 6 hours, may cause drowsiness.S Saline nasal spray used frequently. Drink plenty of fluids and stay well-hydrated. Flonase or Rhinocort nasal spray daily     Final Clinical Impressions(s) / UC Diagnoses   Final diagnoses:  Viral upper respiratory tract infection  PND (post-nasal drip)  Sinus congestion    New Prescriptions New Prescriptions   PREDNISONE (DELTASONE) 50 MG TABLET    1 tab po daily for 6 days. Take  with food.     Controlled Substance Prescriptions [place] Controlled Substance Registry consulted? Not Applicable   Janne Napoleon, NP 09/11/17 1309

## 2017-09-15 ENCOUNTER — Ambulatory Visit: Payer: Managed Care, Other (non HMO) | Admitting: Rheumatology

## 2017-10-05 ENCOUNTER — Ambulatory Visit (INDEPENDENT_AMBULATORY_CARE_PROVIDER_SITE_OTHER): Payer: Managed Care, Other (non HMO) | Admitting: Family Medicine

## 2017-10-05 ENCOUNTER — Other Ambulatory Visit: Payer: Self-pay

## 2017-10-05 ENCOUNTER — Encounter: Payer: Self-pay | Admitting: Family Medicine

## 2017-10-05 VITALS — BP 118/76 | HR 82 | Temp 98.2°F | Resp 16 | Ht 62.0 in | Wt 216.5 lb

## 2017-10-05 DIAGNOSIS — E669 Obesity, unspecified: Secondary | ICD-10-CM | POA: Diagnosis not present

## 2017-10-05 DIAGNOSIS — J309 Allergic rhinitis, unspecified: Secondary | ICD-10-CM

## 2017-10-05 DIAGNOSIS — J320 Chronic maxillary sinusitis: Secondary | ICD-10-CM

## 2017-10-05 MED ORDER — AMOXICILLIN-POT CLAVULANATE 875-125 MG PO TABS: 1.0000 | ORAL_TABLET | Freq: Two times a day (BID) | ORAL | 0 refills | Status: DC

## 2017-10-05 NOTE — Progress Notes (Signed)
Patient ID: [name], female    DOB: 08/10/1971, 46 y.o.   MRN: [id number]  Chief Complaint  Patient presents with  . Establish Care    Allergies Contrast media [iodinated diagnostic agents]  Subjective:   [name] is a 46 y.o. female who presents to [facility] today.  HPI Here to establish care and be evaluated for sinusitis.  Is followed for her rheumatoid arthritis by, Dr. Deveshwar/rheumatologist.  Is on methotrexate for her rheumatoid arthritis.  Reports she has been trying to lose weight however her exercise is limited by pain.  Is being seen at a weight loss clinic and being given phentermine for weight loss/appetite suppressant.  Denies any side effects with this medication.  Reports she does not always take it every day.  Denies any chest pain or shortness of breath.  Denies any palpitations.  Reports that she has had sinus issues for over 20 years.  Gives a history that she has been hospitalized for sinus infections two times. Has had xrays for sinus but is not sure if she has had a CT scan.  Has never been seen by ENT.  Has never been worked up for allergies in the past.  Works at a [employer].  Reports she has been on allergy medicines for as long as she can remember.  Uses Flonase once a day and zytrec daily for allergies. Has been to [facility] in the past for sinus infections.  Reports that Zithromax is what always works for her sinuses.  Reports has sinus drainage down the back of her throat.  Does not cough.  Reports she does not smoke.  Reports she has intermittent nasal congestion.  Denies any skin rashes or history of eczema.  Does not want a flu shot.  Has not had a pneumonia shot.  Does not want it.   Sinus Problem  This is a new problem. The current episode started more than 1 month ago. The problem has been gradually worsening since onset. There has been no fever. Her pain is at a severity of 7/10. The pain is moderate. Associated  symptoms include congestion, ear pain, headaches, sinus pressure, sneezing and a sore throat. Pertinent negatives include no chills, coughing, diaphoresis, hoarse voice, neck pain, shortness of breath or swollen glands. Past treatments include acetaminophen. The treatment provided no relief.    Past Medical History:  Diagnosis Date  . Arthritis   . BV (bacterial vaginosis)   . Constipation 06/16/2016  . HSV (herpes simplex virus) infection   . IUD (intrauterine device) in place 03/20/2014   IUD inserted 01/23/13  . Obesity   . Osteoarthritis   . RA (rheumatoid arthritis) ([place])   . Trichomonas     Past Surgical History:  Procedure Laterality Date  . APPENDECTOMY    . CHOLECYSTECTOMY    . HAND SURGERY    . OVARIAN CYST REMOVAL N/A 08/17/2007  . TONSILLECTOMY      Family History  Problem Relation Age of Onset  . Diabetes Mother   . COPD Mother   . Arthritis Mother   . Hypertension Mother   . Heart disease Mother   . Asthma Son   . Cancer Maternal Grandmother        lung  . Heart disease Paternal Grandmother        CHF     Social History   Socioeconomic History  . Marital status: Single    Spouse name: None  . Number of  children: None  . Years of education: None  . Highest education level: None  Social Needs  . Financial resource strain: None  . Food insecurity - worry: None  . Food insecurity - inability: None  . Transportation needs - medical: None  . Transportation needs - non-medical: None  Occupational History  . None  Tobacco Use  . Smoking status: Never Smoker  . Smokeless tobacco: Never Used  Substance and Sexual Activity  . Alcohol use: Yes    Comment: rarely  . Drug use: No  . Sexual activity: Not Currently    Birth control/protection: IUD  Other Topics Concern  . None  Social History Narrative  . None     Review of Systems  Constitutional: Negative for appetite change, chills, diaphoresis, fatigue, fever and unexpected weight change.  HENT:  Positive for congestion, ear pain, nosebleeds, postnasal drip, sinus pressure, sneezing and sore throat. Negative for ear discharge, facial swelling, hoarse voice, mouth sores, tinnitus, trouble swallowing and voice change.   Respiratory: Negative for apnea, cough, chest tightness, shortness of breath, wheezing and stridor.   Cardiovascular: Negative for chest pain, palpitations and leg swelling.  Gastrointestinal: Negative for abdominal pain and nausea.  Endocrine: Negative for polydipsia and polyphagia.  Genitourinary: Negative for dysuria, frequency and urgency.  Musculoskeletal: Negative for neck pain.  Skin: Negative for rash.  Neurological: Positive for headaches. Negative for tremors, syncope, light-headedness and numbness.  Hematological: Negative for adenopathy.  Psychiatric/Behavioral: Negative for dysphoric mood and sleep disturbance. The patient is not nervous/anxious.      Objective:   BP 118/76 (BP Location: Left Arm, Patient Position: Sitting, Cuff Size: Normal)   Pulse 82   Temp 98.2 F (36.8 C) (Other (Comment))   Resp 16   Ht 5\' 2"  (1.575 m)   Wt 216 lb 8 oz (98.2 kg)   SpO2 97%   BMI 39.60 kg/m   Physical Exam  Constitutional: She is oriented to person, place, and time. She appears well-developed and well-nourished. No distress.  HENT:  Head: Normocephalic and atraumatic. Head is without right periorbital erythema and without left periorbital erythema.  Right Ear: Tympanic membrane and external ear normal.  Left Ear: Tympanic membrane and external ear normal.  Nose: Mucosal edema and rhinorrhea present. No sinus tenderness, septal deviation or nasal septal hematoma. No epistaxis. Right sinus exhibits maxillary sinus tenderness. Right sinus exhibits no frontal sinus tenderness. Left sinus exhibits maxillary sinus tenderness. Left sinus exhibits no frontal sinus tenderness.  Mouth/Throat: Uvula is midline, oropharynx is clear and moist and mucous membranes are  normal. Mucous membranes are not pale and not dry. No tonsillar exudate.  Eyes: Pupils are equal, round, and reactive to light.  Neck: Normal range of motion. Neck supple. No thyromegaly present.  Cardiovascular: Normal rate, regular rhythm and normal heart sounds.  Pulmonary/Chest: Effort normal and breath sounds normal. No respiratory distress.  Abdominal: Soft. Bowel sounds are normal.  Neurological: She is alert and oriented to person, place, and time. No cranial nerve deficit.  Skin: Skin is warm and dry.  Psychiatric: She has a normal mood and affect. Her behavior is normal. Thought content normal.  Nursing note and vitals reviewed.    Assessment and Plan   1. Chronic maxillary sinusitis Patient reports that Zithromax has worked for her in the past however we discussed that this is not a great treatment for sinus infection. - amoxicillin-clavulanate (AUGMENTIN) 875-125 MG tablet; Take 1 tablet 2 (two) times daily by mouth.  Dispense:  20 tablet; Refill: 0 Patient was counseled concerning worrisome signs and symptoms and if they develop please call or return to clinic. 2. Allergic rhinitis, unspecified seasonality, unspecified trigger Patient told to increase Flonase to 1 spray each nostril twice a day. Allergy avoidance discussed.  Patient has dealt with chronic allergies and sinusitis for years however has never had allergy testing or evaluation by ENT.  We discussed that this would be the next step in her management.  She will consider this and we will discuss at her next visit. Today we did discuss the patient cannot use phenylephrine or other decongestants while she is taking the phentermine due to cardiac risks.  She voiced understanding.  3. Obesity (BMI 35.0-39.9 without comorbidity) Diet exercise and weight loss was discussed.  Today we discussed the risks of phentermine including cardiac manifestations associated with heart rate and structural problems of the heart.  She voiced  understanding and reports that she knows the risks of taking the medication.  Patient will follow-up for complete physical exam. Return in about 3 months (around 01/05/2018). Caren Macadam, MD 10/06/2017

## 2017-10-06 ENCOUNTER — Encounter: Payer: Self-pay | Admitting: Family Medicine

## 2017-10-27 ENCOUNTER — Other Ambulatory Visit: Payer: Self-pay | Admitting: *Deleted

## 2017-10-27 MED ORDER — DICLOFENAC SODIUM 1 % TD GEL: TRANSDERMAL | 3 refills | Status: DC

## 2017-10-27 NOTE — Telephone Encounter (Signed)
Last Visit: 06/17/17 Next Visit: 11/23/17  Okay to refill per Dr. Deveshwar 

## 2017-11-02 ENCOUNTER — Other Ambulatory Visit: Payer: Self-pay | Admitting: Rheumatology

## 2017-11-02 NOTE — Telephone Encounter (Signed)
Last Visit: 06/17/17 Next Visit: 11/23/17 Labs: 06/17/17 WNL  Patient will come to update labs 11/03/17  Okay to refill 30 day supply per Dr. Estanislado Pandy

## 2017-11-10 NOTE — Progress Notes (Signed)
Office Visit Note  Patient: [name]             Date of Birth: Sep 11, 1971           MRN: [id number]             PCP: Caren Macadam, MD Referring: Rosita Fire, MD Visit Date: 11/23/2017 Occupation: @GUAROCC @    Subjective:  Bilateral SI joint    History of Present Illness: [name] is a 46 y.o. female with history of seropositive rheumatoid arthritis and sacroiliitis.  Patient states she continues to have bilateral SI joint pain.  She states she recently got a new mattress, which has caused some increased lower back pain.  She states overall her rheumatoid arthritis has been very well controlled on MTX 8 tablets weekly.  She is taking Folic acid 2 mg daily. She denies any hand pain or swelling.  She states she occasionally has left hip pain that is worse with excessive walking.     Activities of Daily Living:  Patient reports morning stiffness for 5  minutes.   Patient Denies nocturnal pain.  Difficulty dressing/grooming: Denies Difficulty climbing stairs: Denies Difficulty getting out of chair: Denies Difficulty using hands for taps, buttons, cutlery, and/or writing: Denies   Review of Systems  Constitutional: Positive for fatigue. Negative for weakness.  HENT: Negative for mouth sores, mouth dryness and nose dryness.   Eyes: Negative for redness and dryness.  Respiratory: Negative for cough, hemoptysis, shortness of breath and difficulty breathing.   Cardiovascular: Negative for chest pain, palpitations, hypertension, irregular heartbeat and swelling in legs/feet.  Gastrointestinal: Negative for blood in stool, constipation and diarrhea.  Endocrine: Negative for increased urination.  Genitourinary: Negative for painful urination.  Musculoskeletal: Positive for arthralgias, joint pain and morning stiffness. Negative for joint swelling, myalgias, muscle weakness, muscle tenderness and myalgias.  Skin: Positive for nodules/bumps (right wrist). Negative for  color change, pallor, rash, hair loss, redness, skin tightness, ulcers and sensitivity to sunlight.  Neurological: Negative for dizziness, numbness and headaches.  Hematological: Negative for swollen glands.  Psychiatric/Behavioral: Positive for sleep disturbance. Negative for depressed mood. The patient is not nervous/anxious.     PMFS History:  Patient Active Problem List   Diagnosis Date Noted  . 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 Well woman exam with routine gynecological exam 07/14/2017  . Skin tag 07/14/2017  . Rheumatoid arthritis with rheumatoid factor of multiple sites without organ or systems involvement ([place]) 11/09/2016  . High risk medication use 11/09/2016  . Non compliance w medication regimen 11/09/2016  . Constipation 06/16/2016  . Obesity 05/16/2015  . IUD (intrauterine device) in place 03/20/2014    Past Medical History:  Diagnosis Date  . Arthritis   . BV (bacterial vaginosis)   . Constipation 06/16/2016  . HSV (herpes simplex virus) infection   . IUD (intrauterine device) in place 03/20/2014   IUD inserted 01/23/13  . Obesity   . Osteoarthritis   . RA (rheumatoid arthritis) ([place])   . Trichomonas     Family History  Problem Relation Age of Onset  . Diabetes Mother   . COPD Mother   . Arthritis Mother   . Hypertension Mother   . Heart disease Mother   . Asthma Son   . Cancer Maternal Grandmother        lung  . Heart disease Paternal Grandmother        CHF   Past Surgical History:  Procedure Laterality Date  .  APPENDECTOMY    . CHOLECYSTECTOMY    . HAND SURGERY    . OVARIAN CYST REMOVAL N/A 08/17/2007  . TONSILLECTOMY     Social History   Social History Narrative   Works at a [employer].   Has children.   Does not smoke.   Eats meat fruits and vegetables.   Wears her seatbelt.     Objective: Vital Signs: BP 132/70 (BP Location: Left Arm, Patient Position: Sitting, Cuff Size: Normal)   Pulse 72   Resp 16   Ht 5\' 2"  (1.575 m)   Wt 228  lb (103.4 kg)   BMI 41.70 kg/m    Physical Exam  Constitutional: She is oriented to person, place, and time. She appears well-developed and well-nourished.  HENT:  Head: Normocephalic and atraumatic.  Eyes: Conjunctivae and EOM are normal.  Neck: Normal range of motion.  Cardiovascular: Normal rate, regular rhythm, normal heart sounds and intact distal pulses.  Pulmonary/Chest: Effort normal and breath sounds normal.  Abdominal: Soft. Bowel sounds are normal.  Lymphadenopathy:    She has no cervical adenopathy.  Neurological: She is alert and oriented to person, place, and time.  Skin: Skin is warm and dry. Capillary refill takes less than 2 seconds.  Psychiatric: She has a normal mood and affect. Her behavior is normal.  Nursing note and vitals reviewed.    Musculoskeletal Exam: C-spine, thoracic, and lumbar spine good ROM. No midline spinal tenderness.  Tenderness of bilateral SI joints. Shoulder joints, elbow joints, wrist joints, MCPs, PIPs, and DIPs good ROM with no synovitis.  Hip joints, knee joints, ankle joints, MTPs, PIPs, and DIPs good ROM with no synovitis.  No trochanteric bursitis.  No knee warmth or effusion.    CDAI Exam: CDAI Homunculus Exam:   Joint Counts:  CDAI Tender Joint count: 0 CDAI Swollen Joint count: 0  Global Assessments:  Patient Global Assessment: 0 Provider Global Assessment: 0    Investigation: No additional findings. CBC Latest Ref Rng & Units 11/17/2017 06/17/2017 02/17/2017  WBC 3.8 - 10.8 Thousand/uL 8.6 9.1 9.7  Hemoglobin 11.7 - 15.5 g/dL 13.8 13.7 13.8  Hematocrit 35.0 - 45.0 % 40.7 41.3 41.8  Platelets 140 - 400 Thousand/uL 302 337 316   CMP Latest Ref Rng & Units 11/17/2017 06/17/2017 02/17/2017  Glucose 65 - 99 mg/dL 112(H) 74 71  BUN 7 - 25 mg/dL 10 9 6(L)  Creatinine 0.50 - 1.10 mg/dL 0.76 0.86 0.68  Sodium 135 - 146 mmol/L 139 136 137  Potassium 3.5 - 5.3 mmol/L 4.1 4.3 4.3  Chloride 98 - 110 mmol/L 104 100 103  CO2 20 - 32  mmol/L 29 19(L) 25  Calcium 8.6 - 10.2 mg/dL 9.4 9.3 9.3  Total Protein 6.1 - 8.1 g/dL 7.0 7.4 7.1  Total Bilirubin 0.2 - 1.2 mg/dL 0.4 0.5 0.4  Alkaline Phos 33 - 115 U/L - 75 74  AST 10 - 35 U/L 27 20 19   ALT 6 - 29 U/L 33(H) 21 29   Imaging: No results found.  Speciality Comments: No specialty comments available.    Procedures:  No procedures performed Allergies: Contrast media [iodinated diagnostic agents]   Assessment / Plan:     Visit Diagnoses: Rheumatoid arthritis with rheumatoid factor of multiple sites without organ or systems involvement ([facility]) - +RF, +CCP: No synovitis on exam. She clinically doing very well on MTX.  She has been taking MTX 8 tablets weekly.  We are going to decreased her MTX to 7  tablets weekly due to a mild elevation in ALT.  We will continue to monitor her CBC and CMP every 3 months.    High risk medication use - MTX, folic acid 2 mg daily.  Her most recent labs on 11/17/17 revealed an elevated ALT and all other labs were WNL.  She is going to decrease her dose of MTX to 7 tablets weekly.  We will recheck labs in 3 months. Standing labs were ordered today.  Chronic SI joint pain: she continues to have tenderness in bilateral SI joints.  She declined a cortisone injection at this time. She was given information on SI joint dysfunction and back exercises that she can perform at home.    History of obesity: She reports she has been trying to exercise 3-4 times per week.  She is going to continue to try to lose weight.    Non compliance w medication regimen    Orders: Orders Placed This Encounter  Procedures  . CBC with Differential/Platelet  . COMPLETE METABOLIC PANEL WITH GFR   No orders of the defined types were placed in this encounter.    Follow-Up Instructions: Return in about 5 months (around 04/23/2018) for Rheumatoid arthritis.  Bo Merino, MD  Note - This record has been created using Editor, commissioning.  Chart creation errors have  been sought, but may not always  have been located. Such creation errors do not reflect on  the standard of medical care.

## 2017-11-17 ENCOUNTER — Other Ambulatory Visit: Payer: Self-pay

## 2017-11-17 DIAGNOSIS — Z79899 Other long term (current) drug therapy: Secondary | ICD-10-CM

## 2017-11-17 LAB — CBC WITH DIFFERENTIAL/PLATELET
BASOS ABS: 69 {cells}/uL (ref 0–200)
Basophils Relative: 0.8 %
EOS PCT: 1.9 %
Eosinophils Absolute: 163 cells/uL (ref 15–500)
HEMATOCRIT: 40.7 % (ref 35.0–45.0)
HEMOGLOBIN: 13.8 g/dL (ref 11.7–15.5)
LYMPHS ABS: 1926 {cells}/uL (ref 850–3900)
MCH: 31.9 pg (ref 27.0–33.0)
MCHC: 33.9 g/dL (ref 32.0–36.0)
MCV: 94.2 fL (ref 80.0–100.0)
MPV: 10.1 fL (ref 7.5–12.5)
Monocytes Relative: 6.3 %
NEUTROS ABS: 5900 {cells}/uL (ref 1500–7800)
Neutrophils Relative %: 68.6 %
Platelets: 302 10*3/uL (ref 140–400)
RBC: 4.32 10*6/uL (ref 3.80–5.10)
RDW: 12.9 % (ref 11.0–15.0)
Total Lymphocyte: 22.4 %
WBC mixed population: 542 cells/uL (ref 200–950)
WBC: 8.6 10*3/uL (ref 3.8–10.8)

## 2017-11-17 LAB — COMPLETE METABOLIC PANEL WITH GFR
AG Ratio: 1.6 (calc) (ref 1.0–2.5)
ALT: 33 U/L — AB (ref 6–29)
AST: 27 U/L (ref 10–35)
Albumin: 4.3 g/dL (ref 3.6–5.1)
Alkaline phosphatase (APISO): 87 U/L (ref 33–115)
BILIRUBIN TOTAL: 0.4 mg/dL (ref 0.2–1.2)
BUN: 10 mg/dL (ref 7–25)
CALCIUM: 9.4 mg/dL (ref 8.6–10.2)
CHLORIDE: 104 mmol/L (ref 98–110)
CO2: 29 mmol/L (ref 20–32)
Creat: 0.76 mg/dL (ref 0.50–1.10)
GFR, EST AFRICAN AMERICAN: 109 mL/min/{1.73_m2} (ref >=60)
GFR, EST NON AFRICAN AMERICAN: 94 mL/min/{1.73_m2} (ref >=60)
GLUCOSE: 112 mg/dL — AB (ref 65–99)
Globulin: 2.7 g/dL (calc) (ref 1.9–3.7)
Potassium: 4.1 mmol/L (ref 3.5–5.3)
Sodium: 139 mmol/L (ref 135–146)
TOTAL PROTEIN: 7 g/dL (ref 6.1–8.1)

## 2017-11-18 NOTE — Progress Notes (Signed)
Patient is clinically doing well during the last visit in July. Her next appointment is in January. Her LFTs are mildly elevated. She may reduce her methotrexate to 7 tablets by mouth every week. We will continue to monitor her labs.

## 2017-11-23 ENCOUNTER — Encounter: Payer: Self-pay | Admitting: Rheumatology

## 2017-11-23 ENCOUNTER — Ambulatory Visit (INDEPENDENT_AMBULATORY_CARE_PROVIDER_SITE_OTHER): Payer: 59 | Admitting: Rheumatology

## 2017-11-23 VITALS — BP 132/70 | HR 72 | Resp 16 | Ht 62.0 in | Wt 228.0 lb

## 2017-11-23 DIAGNOSIS — Z8639 Personal history of other endocrine, nutritional and metabolic disease: Secondary | ICD-10-CM | POA: Diagnosis not present

## 2017-11-23 DIAGNOSIS — G8929 Other chronic pain: Secondary | ICD-10-CM

## 2017-11-23 DIAGNOSIS — M0579 Rheumatoid arthritis with rheumatoid factor of multiple sites without organ or systems involvement: Secondary | ICD-10-CM | POA: Diagnosis not present

## 2017-11-23 DIAGNOSIS — Z79899 Other long term (current) drug therapy: Secondary | ICD-10-CM

## 2017-11-23 DIAGNOSIS — M533 Sacrococcygeal disorders, not elsewhere classified: Secondary | ICD-10-CM

## 2017-11-23 DIAGNOSIS — Z9114 Patient's other noncompliance with medication regimen: Secondary | ICD-10-CM

## 2017-11-23 NOTE — Patient Instructions (Addendum)
Standing Labs We placed an order today for your standing lab work.    Please come back and get your standing labs in March and every 3 months  We have open lab Monday through Friday from 8:30-11:30 AM and 1:30-4 PM at the office of Dr. Bo Merino.   The office is located at [address] [place], [place], [place] [address] No appointment is necessary.   Labs are drawn by [facility].  You may receive a bill from [facility] for your lab work. If you have any questions regarding directions or hours of operation,  please call [phone].       Sacroiliac Joint Dysfunction Sacroiliac joint dysfunction is a condition that causes inflammation on one or both sides of the sacroiliac (SI) joint. The SI joint connects the lower part of the spine (sacrum) with the two upper portions of the pelvis (ilium). This condition causes deep aching or burning pain in the low back. In some cases, the pain may also spread into one or both buttocks or hips or spread down the legs. What are the causes? This condition may be caused by:  Pregnancy. During pregnancy, extra stress is put on the SI joints because the pelvis widens.  Injury, such as: ? Car accidents. ? Sport-related injuries. ? Work-related injuries.  Having one leg that is shorter than the other.  Conditions that affect the joints, such as: ? Rheumatoid arthritis. ? Gout. ? Psoriatic arthritis. ? Joint infection (septic arthritis).  Sometimes, the cause of SI joint dysfunction is not known. What are the signs or symptoms? Symptoms of this condition include:  Aching or burning pain in the lower back. The pain may also spread to other areas, such as: ? Buttocks. ? Groin. ? Thighs and legs.  Muscle spasms in or around the painful areas.  Increased pain when standing, walking, running, stair climbing, bending, or lifting.  How is this diagnosed? Your health care provider will do a physical exam and take your medical history.  During the exam, the health care provider may move one or both of your legs to different positions to check for pain. Various tests may be done to help verify the diagnosis, including:  Imaging tests to look for other causes of pain. These may include: ? MRI. ? CT scan. ? Bone scan.  Diagnostic injection. A numbing medicine is injected into the SI joint using a needle. If the pain is temporarily improved or stopped after the injection, this can indicate that SI joint dysfunction is the problem.  How is this treated? Treatment may vary depending on the cause and severity of your condition. Treatment options may include:  Applying ice or heat to the lower back area. This can help to reduce pain and muscle spasms.  Medicines to relieve pain or inflammation or to relax the muscles.  Wearing a back brace (sacroiliac brace) to help support the joint while your back is healing.  Physical therapy to increase muscle strength around the joint and flexibility at the joint. This may also involve learning proper body positions and ways of moving to relieve stress on the joint.  Direct manipulation of the SI joint.  Injections of steroid medicine into the joint in order to reduce pain and swelling.  Radiofrequency ablation to burn away nerves that are carrying pain messages from the joint.  Use of a device that provides electrical stimulation in order to reduce pain at the joint.  Surgery to put in screws and plates that limit or  prevent joint motion. This is rare.  Follow these instructions at home:  Rest as needed. Limit your activities as directed by your health care provider.  Take medicines only as directed by your health care provider.  If directed, apply ice to the affected area: ? Put ice in a plastic bag. ? Place a towel between your skin and the bag. ? Leave the ice on for 20 minutes, 2-3 times per day.  Use a heating pad or a moist heat pack as directed by your health care  provider.  Exercise as directed by your health care provider or physical therapist.  Keep all follow-up visits as directed by your health care provider. This is important. Contact a health care provider if:  Your pain is not controlled with medicine.  You have a fever.  You have increasingly severe pain. Get help right away if:  You have weakness, numbness, or tingling in your legs or feet.  You lose control of your bladder or bowel. This information is not intended to replace advice given to you by your health care provider. Make sure you discuss any questions you have with your health care provider. Document Released: 02/04/2009 Document Revised: 04/15/2016 Document Reviewed: 07/16/2014 Elsevier Interactive Patient Education  2018 [employer].  Back Exercises If you have pain in your back, do these exercises 2-3 times each day or as told by your doctor. When the pain goes away, do the exercises once each day, but repeat the steps more times for each exercise (do more repetitions). If you do not have pain in your back, do these exercises once each day or as told by your doctor. Exercises Single Knee to Chest  Do these steps 3-5 times in a row for each leg: 1. Lie on your back on a firm bed or the floor with your legs stretched out. 2. Bring one knee to your chest. 3. Hold your knee to your chest by grabbing your knee or thigh. 4. Pull on your knee until you feel a gentle stretch in your lower back. 5. Keep doing the stretch for 10-30 seconds. 6. Slowly let go of your leg and straighten it.  Pelvic Tilt  Do these steps 5-10 times in a row: 1. Lie on your back on a firm bed or the floor with your legs stretched out. 2. Bend your knees so they point up to the ceiling. Your feet should be flat on the floor. 3. Tighten your lower belly (abdomen) muscles to press your lower back against the floor. This will make your tailbone point up to the ceiling instead of pointing down to  your feet or the floor. 4. Stay in this position for 5-10 seconds while you gently tighten your muscles and breathe evenly.  Cat-Cow  Do these steps until your lower back bends more easily: 1. Get on your hands and knees on a firm surface. Keep your hands under your shoulders, and keep your knees under your hips. You may put padding under your knees. 2. Let your head hang down, and make your tailbone point down to the floor so your lower back is round like the back of a cat. 3. Stay in this position for 5 seconds. 4. Slowly lift your head and make your tailbone point up to the ceiling so your back hangs low (sags) like the back of a cow. 5. Stay in this position for 5 seconds.  Press-Ups  Do these steps 5-10 times in a row: 1. Lie on your  belly (face-down) on the floor. 2. Place your hands near your head, about shoulder-width apart. 3. While you keep your back relaxed and keep your hips on the floor, slowly straighten your arms to raise the top half of your body and lift your shoulders. Do not use your back muscles. To make yourself more comfortable, you may change where you place your hands. 4. Stay in this position for 5 seconds. 5. Slowly return to lying flat on the floor.  Bridges  Do these steps 10 times in a row: 1. Lie on your back on a firm surface. 2. Bend your knees so they point up to the ceiling. Your feet should be flat on the floor. 3. Tighten your butt muscles and lift your butt off of the floor until your waist is almost as high as your knees. If you do not feel the muscles working in your butt and the back of your thighs, slide your feet 1-2 inches farther away from your butt. 4. Stay in this position for 3-5 seconds. 5. Slowly lower your butt to the floor, and let your butt muscles relax.  If this exercise is too easy, try doing it with your arms crossed over your chest. Belly Crunches  Do these steps 5-10 times in a row: 1. Lie on your back on a firm bed or the  floor with your legs stretched out. 2. Bend your knees so they point up to the ceiling. Your feet should be flat on the floor. 3. Cross your arms over your chest. 4. Tip your chin a little bit toward your chest but do not bend your neck. 5. Tighten your belly muscles and slowly raise your chest just enough to lift your shoulder blades a tiny bit off of the floor. 6. Slowly lower your chest and your head to the floor.  Back Lifts Do these steps 5-10 times in a row: 1. Lie on your belly (face-down) with your arms at your sides, and rest your forehead on the floor. 2. Tighten the muscles in your legs and your butt. 3. Slowly lift your chest off of the floor while you keep your hips on the floor. Keep the back of your head in line with the curve in your back. Look at the floor while you do this. 4. Stay in this position for 3-5 seconds. 5. Slowly lower your chest and your face to the floor.  Contact a doctor if:  Your back pain gets a lot worse when you do an exercise.  Your back pain does not lessen 2 hours after you exercise. If you have any of these problems, stop doing the exercises. Do not do them again unless your doctor says it is okay. Get help right away if:  You have sudden, very bad back pain. If this happens, stop doing the exercises. Do not do them again unless your doctor says it is okay. This information is not intended to replace advice given to you by your health care provider. Make sure you discuss any questions you have with your health care provider. Document Released: 12/11/2010 Document Revised: 04/15/2016 Document Reviewed: 01/02/2015 Elsevier Interactive Patient Education  [employer].

## 2017-11-28 ENCOUNTER — Other Ambulatory Visit: Payer: Self-pay | Admitting: *Deleted

## 2017-11-28 MED ORDER — METHOTREXATE 2.5 MG PO TABS: 17.5000 mg | ORAL_TABLET | ORAL | 0 refills | Status: DC

## 2017-11-28 NOTE — Telephone Encounter (Signed)
Last Visit: 11/23/17 Next Visit: 05/01/18 Labs: 11/17/17 LFTs mildly elevated   Okay to refill per Dr. Estanislado Pandy

## 2017-11-29 ENCOUNTER — Other Ambulatory Visit: Payer: Self-pay | Admitting: Rheumatology

## 2017-12-20 ENCOUNTER — Other Ambulatory Visit: Payer: Self-pay | Admitting: Rheumatology

## 2018-01-07 ENCOUNTER — Other Ambulatory Visit: Payer: Self-pay | Admitting: Rheumatology

## 2018-01-09 NOTE — Telephone Encounter (Signed)
Last Visit: 11/23/17 Next Visit: 05/01/18 Labs: 11/17/17 LFTs mildly elevated   Okay to refill per Dr. Estanislado Pandy

## 2018-01-17 ENCOUNTER — Ambulatory Visit: Payer: 59 | Admitting: Advanced Practice Midwife

## 2018-01-26 ENCOUNTER — Ambulatory Visit (INDEPENDENT_AMBULATORY_CARE_PROVIDER_SITE_OTHER): Payer: 59 | Admitting: Advanced Practice Midwife

## 2018-01-26 ENCOUNTER — Encounter: Payer: Self-pay | Admitting: Advanced Practice Midwife

## 2018-01-26 VITALS — BP 118/78 | HR 78 | Ht 62.0 in | Wt 219.0 lb

## 2018-01-26 DIAGNOSIS — Z30433 Encounter for removal and reinsertion of intrauterine contraceptive device: Secondary | ICD-10-CM | POA: Diagnosis not present

## 2018-01-26 DIAGNOSIS — Z3202 Encounter for pregnancy test, result negative: Secondary | ICD-10-CM | POA: Diagnosis not present

## 2018-01-26 DIAGNOSIS — Z30432 Encounter for removal of intrauterine contraceptive device: Secondary | ICD-10-CM

## 2018-01-26 DIAGNOSIS — Z30430 Encounter for insertion of intrauterine contraceptive device: Secondary | ICD-10-CM | POA: Insufficient documentation

## 2018-01-26 LAB — POCT URINE PREGNANCY: PREG TEST UR: NEGATIVE

## 2018-01-26 MED ORDER — LEVONORGESTREL 20 MCG/24HR IU IUD
INTRAUTERINE_SYSTEM | Freq: Once | INTRAUTERINE | Status: AC
  Administered 2018-01-26: 1 via INTRAUTERINE

## 2018-01-26 NOTE — Progress Notes (Signed)
[name] is a 48 y.o. year old  female   who presents for removal and replacement of her IUD. The new IUD is Mirena. It has been 5 years since her previous IUD placement.  The risks and benefits of the method and placement have been thouroughly reviewed with the patient and all questions were answered.  Specifically the patient is aware of failure rate of 11/998, expulsion of the IUD and of possible perforation.  The patient is aware of irregular bleeding due to the method and understands the incidence of irregular bleeding diminishes with time.  Time out was performed.  A Graves speculum was placed.  The cervix was prepped using Betadine. The strings were found to be  visible.   They were grasped and the Mirena was easily removed. The cervix was then grasped with a tenaculum and the uterus was sounded to 8 cm. The IUD was inserted to 8 cm.  It was pulled back 1 cm and the IUD was disengaged. After 15 seconds, the IUD was placed in the fundus.  The strings were trimmed to 3 cm.  Sonogram was performed and the proper placement of the IUD was verified.  The patient was instructed on signs and symptoms of infection and to check for the strings after each menses or each month.  The patient is to refrain from intercourse for 3 days.  Has 3 skin tags she wants removed (inner thing, under left arm and right side of neck)  Will need to make separate appt.

## 2018-02-15 ENCOUNTER — Other Ambulatory Visit: Payer: Self-pay | Admitting: Rheumatology

## 2018-02-15 NOTE — Telephone Encounter (Signed)
Last Visit: 11/23/17 Next Visit: 05/01/18   Okay to refill per Dr. Estanislado Pandy

## 2018-02-16 ENCOUNTER — Encounter: Payer: Self-pay | Admitting: Advanced Practice Midwife

## 2018-02-16 ENCOUNTER — Ambulatory Visit (INDEPENDENT_AMBULATORY_CARE_PROVIDER_SITE_OTHER): Payer: 59 | Admitting: Advanced Practice Midwife

## 2018-02-16 ENCOUNTER — Other Ambulatory Visit: Payer: Self-pay

## 2018-02-16 VITALS — BP 122/80 | HR 91 | Ht 62.0 in | Wt 223.0 lb

## 2018-02-16 DIAGNOSIS — L918 Other hypertrophic disorders of the skin: Secondary | ICD-10-CM

## 2018-02-16 NOTE — Progress Notes (Signed)
Skin Tag Removal Procedure Note  Pre-operative Diagnosis: Classic skin tags (acrochordon)  Post-operative Diagnosis: Classic skin tags (acrochordon)  Locations: right inner thigh, right neck, left armpit  Indications: pt states these have grown in size over the last few years, uncomfortable and unsightly  Anesthesia: Lidocaine 1% without epinephrine   Procedure Details  The risks (including bleeding and infection) and benefits of the procedure and Written informed consent obtained. Using sterile iris scissors, multiple skin tags were snipped off at their bases after cleansing with Betadine.  Bleeding was controlled by pressure.   Findings: Pathognomonic benign lesions  not sent for pathological exam.  Condition: Stable  Complications: none.  Plan: 1. Instructed to keep the wounds dry and covered for 24-48h and clean thereafter. 2. Warning signs of infection were reviewed.   3. Recommended that the patient use tylenon as needed for pain.  4. Return as needed.

## 2018-02-21 ENCOUNTER — Encounter: Payer: Self-pay | Admitting: Advanced Practice Midwife

## 2018-04-03 ENCOUNTER — Other Ambulatory Visit: Payer: Self-pay | Admitting: Adult Health

## 2018-04-04 ENCOUNTER — Other Ambulatory Visit: Payer: Self-pay | Admitting: *Deleted

## 2018-04-04 MED ORDER — FOLIC ACID 1 MG PO TABS: 2.0000 mg | ORAL_TABLET | Freq: Every day | ORAL | 3 refills | Status: DC

## 2018-04-04 NOTE — Telephone Encounter (Signed)
Refill request received via fax  Last Visit: 11/23/17 Next Visit: 05/01/18  Okay to refill per Dr. Estanislado Pandy

## 2018-04-08 ENCOUNTER — Other Ambulatory Visit: Payer: Self-pay | Admitting: Rheumatology

## 2018-04-10 NOTE — Telephone Encounter (Signed)
ok 

## 2018-04-10 NOTE — Telephone Encounter (Signed)
Last Visit: 11/23/17 Next Visit: 05/01/18 Labs: 11/17/17 ALT 33 previously normal   Patient advised she is due update labs. Patient states she will update labs this week.  Okay to refill 30 day supply MTX?

## 2018-04-14 ENCOUNTER — Other Ambulatory Visit: Payer: Self-pay

## 2018-04-14 DIAGNOSIS — Z79899 Other long term (current) drug therapy: Secondary | ICD-10-CM

## 2018-04-14 LAB — COMPLETE METABOLIC PANEL WITH GFR
AG Ratio: 1.6 (calc) (ref 1.0–2.5)
ALBUMIN MSPROF: 4.2 g/dL (ref 3.6–5.1)
ALKALINE PHOSPHATASE (APISO): 78 U/L (ref 33–115)
ALT: 76 U/L — AB (ref 6–29)
AST: 44 U/L — ABNORMAL HIGH (ref 10–35)
BILIRUBIN TOTAL: 0.5 mg/dL (ref 0.2–1.2)
BUN: 12 mg/dL (ref 7–25)
CHLORIDE: 105 mmol/L (ref 98–110)
CO2: 27 mmol/L (ref 20–32)
CREATININE: 0.95 mg/dL (ref 0.50–1.10)
Calcium: 9 mg/dL (ref 8.6–10.2)
GFR, EST AFRICAN AMERICAN: 83 mL/min/{1.73_m2} (ref >=60)
GFR, Est Non African American: 72 mL/min/{1.73_m2} (ref >=60)
GLUCOSE: 92 mg/dL (ref 65–99)
Globulin: 2.7 g/dL (calc) (ref 1.9–3.7)
Potassium: 4.3 mmol/L (ref 3.5–5.3)
Sodium: 138 mmol/L (ref 135–146)
TOTAL PROTEIN: 6.9 g/dL (ref 6.1–8.1)

## 2018-04-14 LAB — CBC WITH DIFFERENTIAL/PLATELET
Basophils Absolute: 82 cells/uL (ref 0–200)
Basophils Relative: 1 %
Eosinophils Absolute: 172 cells/uL (ref 15–500)
Eosinophils Relative: 2.1 %
HCT: 39.7 % (ref 35.0–45.0)
Hemoglobin: 13.7 g/dL (ref 11.7–15.5)
LYMPHS ABS: 2009 {cells}/uL (ref 850–3900)
MCH: 31.7 pg (ref 27.0–33.0)
MCHC: 34.5 g/dL (ref 32.0–36.0)
MCV: 91.9 fL (ref 80.0–100.0)
MONOS PCT: 8.1 %
MPV: 10.1 fL (ref 7.5–12.5)
NEUTROS ABS: 5273 {cells}/uL (ref 1500–7800)
NEUTROS PCT: 64.3 %
PLATELETS: 291 10*3/uL (ref 140–400)
RBC: 4.32 10*6/uL (ref 3.80–5.10)
RDW: 12.9 % (ref 11.0–15.0)
TOTAL LYMPHOCYTE: 24.5 %
WBC: 8.2 10*3/uL (ref 3.8–10.8)
WBCMIX: 664 {cells}/uL (ref 200–950)

## 2018-04-18 NOTE — Progress Notes (Signed)
LFTs elevated.  She is on MTX 7 tablets po once weekly.  It appears she was recently started on Flagyl, which can also increase LFTs.  Please advise patient to return for recheck of LFTs in 2 weeks.

## 2018-04-19 NOTE — Progress Notes (Signed)
Office Visit Note  Patient: [name]             Date of Birth: [birth year]           MRN: [id number]             PCP: Caren Macadam, MD Referring: Caren Macadam, MD Visit Date: 05/01/2018 Occupation: @GUAROCC @    Subjective:  Right shoulder pain   History of Present Illness: [name] is a 47 y.o. female with history of seropositive rheumatoid arthritis.  She takes methotrexate 7 tablets by mouth once weekly and folic acid 2 mg daily.She decreased her methotrexate 8 tablets by mouth once weekly to 7 tablets by mouth weekly due to elevated LFTs.  She has not noticed any difference since decreasing her MTX dose.  She does report over the past 1 month she has had 3 flares.  She states she had pain in left hip, then right hand pain and swelling, and today is having pain in the right shoulder.  She denies any injuries to the shoulder.  She states she is having pain with ROM.  She denies any pain at night.  She denies any SI joint pain at this time.    Activities of Daily Living:  Patient reports morning stiffness for 0 minutes.   Patient Denies nocturnal pain.  Difficulty dressing/grooming: Reports Difficulty climbing stairs: Denies Difficulty getting out of chair: Denies Difficulty using hands for taps, buttons, cutlery, and/or writing: Denies   Review of Systems  Constitutional: Negative for fatigue and fever.  HENT: Negative for ear pain, mouth sores, mouth dryness and nose dryness.   Eyes: Negative for pain, visual disturbance and dryness.  Respiratory: Negative for cough, hemoptysis, shortness of breath and difficulty breathing.   Cardiovascular: Negative for chest pain, palpitations, hypertension and swelling in legs/feet.  Gastrointestinal: Positive for constipation. Negative for blood in stool and diarrhea.  Endocrine: Negative for increased urination.  Genitourinary: Negative for difficulty urinating and painful urination.  Musculoskeletal: Positive for  arthralgias, joint pain and joint swelling. Negative for myalgias, muscle weakness, morning stiffness, muscle tenderness and myalgias.  Skin: Negative for color change, pallor, rash, hair loss, nodules/bumps, skin tightness, ulcers and sensitivity to sunlight.  Allergic/Immunologic: Negative for susceptible to infections.  Neurological: Negative for dizziness, numbness, headaches and weakness.  Hematological: Negative for bruising/bleeding tendency and swollen glands.  Psychiatric/Behavioral: Negative for depressed mood and sleep disturbance. The patient is not nervous/anxious.     PMFS History:  Patient Active Problem List   Diagnosis Date Noted  . Encounter for IUD insertion 01/26/2018  . 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 Well woman exam with routine gynecological exam 07/14/2017  . Multiple acquired skin tags 07/14/2017  . Rheumatoid arthritis with rheumatoid factor of multiple sites without organ or systems involvement ([place]) 11/09/2016  . High risk medication use 11/09/2016  . Non compliance w medication regimen 11/09/2016  . Constipation 06/16/2016  . Obesity 05/16/2015    Past Medical History:  Diagnosis Date  . Arthritis   . BV (bacterial vaginosis)   . Constipation 06/16/2016  . HSV (herpes simplex virus) infection   . IUD (intrauterine device) in place 03/20/2014   IUD inserted 01/23/13  . Obesity   . Osteoarthritis   . RA (rheumatoid arthritis) ([place])   . Trichomonas     Family History  Problem Relation Age of Onset  . Diabetes Mother   . COPD Mother   . Arthritis Mother   . Hypertension Mother   .  Heart disease Mother   . Asthma Son   . Cancer Maternal Grandmother        lung  . Heart disease Paternal Grandmother        CHF   Past Surgical History:  Procedure Laterality Date  . APPENDECTOMY    . CHOLECYSTECTOMY    . HAND SURGERY    . OVARIAN CYST REMOVAL N/A 08/17/2007  . 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   Has children.   Does not smoke.   Eats meat fruits and vegetables.   Wears her seatbelt.     Objective: Vital Signs: BP 129/90 (BP Location: Left Arm, Patient Position: Sitting, Cuff Size: Normal)   Pulse 95   Ht 5\' 2"  (1.575 m)   Wt 219 lb (99.3 kg)   BMI 40.06 kg/m    Physical Exam  Constitutional: She is oriented to person, place, and time. She appears well-developed and well-nourished.  HENT:  Head: Normocephalic and atraumatic.  Eyes: Conjunctivae and EOM are normal.  Neck: Normal range of motion.  Cardiovascular: Normal rate, regular rhythm, normal heart sounds and intact distal pulses.  Pulmonary/Chest: Effort normal and breath sounds normal.  Abdominal: Soft. Bowel sounds are normal.  Lymphadenopathy:    She has no cervical adenopathy.  Neurological: She is alert and oriented to person, place, and time.  Skin: Skin is warm and dry. Capillary refill takes less than 2 seconds.  Psychiatric: She has a normal mood and affect. Her behavior is normal.  Nursing note and vitals reviewed.    Musculoskeletal Exam: C-spine, thoracic spine, lumbar spine good range of motion.  No midline spinal tenderness.  No SI joint tenderness.  Right shoulder good range of motion with discomfort.  Left shoulder full range of motion with no discomfort.  Elbow joints, wrist joints, MCPs, PIPs, DIPs good range of motion with no synovitis.  Complete fist formation bilaterally.  Hip joints, knee joints, ankle joints, MTPs, PIPs, DIPs good range of motion no synovitis.  No warmth or effusion of bilateral knee joints.  No tenderness of trochanteric bursa.  CDAI Exam: No CDAI exam completed.    Investigation: No additional findings. CBC Latest Ref Rng & Units 04/14/2018 11/17/2017 06/17/2017  WBC 3.8 - 10.8 Thousand/uL 8.2 8.6 9.1  Hemoglobin 11.7 - 15.5 g/dL 13.7 13.8 13.7  Hematocrit 35.0 - 45.0 % 39.7 40.7 41.3  Platelets 140 - 400 Thousand/uL 291 302 337   CMP Latest Ref Rng & Units  04/14/2018 11/17/2017 06/17/2017  Glucose 65 - 99 mg/dL 92 112(H) 74  BUN 7 - 25 mg/dL 12 10 9   Creatinine 0.50 - 1.10 mg/dL 0.95 0.76 0.86  Sodium 135 - 146 mmol/L 138 139 136  Potassium 3.5 - 5.3 mmol/L 4.3 4.1 4.3  Chloride 98 - 110 mmol/L 105 104 100  CO2 20 - 32 mmol/L 27 29 19(L)  Calcium 8.6 - 10.2 mg/dL 9.0 9.4 9.3  Total Protein 6.1 - 8.1 g/dL 6.9 7.0 7.4  Total Bilirubin 0.2 - 1.2 mg/dL 0.5 0.4 0.5  Alkaline Phos 33 - 115 U/L - - 75  AST 10 - 35 U/L 44(H) 27 20  ALT 6 - 29 U/L 76(H) 33(H) 21    Imaging: No results found.  Speciality Comments: No specialty comments available.    Procedures:  No procedures performed Allergies: Contrast media [iodinated diagnostic agents]   Assessment / Plan:     Visit Diagnoses:  Rheumatoid arthritis with rheumatoid factor of multiple sites without organ or systems involvement ([facility]) - +RF, +CCP: She has no synovitis on exam today.  She gives a history of a rheumatoid arthritis flare in her right hand about 3 weeks ago.  She presents today with right shoulder pain.  She has full ROM with discomfort.  No crepitus noted.  She declined a cortisone injection today.  She declined a handout of shoulder exercises that she could perform at home.  She has had a methotrexate for 2 weeks but has resumed taking it now.  She is currently on MTX 7 tablets by mouth once weekly and folic acid 2 mg daily.  She has not noticed any difference since decreasing MTX from 8 tablets to 7 tablets weekly due to elevated LFTs.  We will recheck LFTs today.  We will recheck LFTs again in 1 month.  She does not want to make any other medication alterations at this time.  High risk medication use - MTX 7 tablets by mouth once weekly, folic acid 2 mg daily.  On 04/14/2018 AST was 44 and ALT was 76.  She decreased her methotrexate dose from 8 tablets once weekly to 7 tablets once weekly. CMP will be ordered today.   Chronic SI joint pain: She has no SI joint tenderness on exam.    Other medical conditions are listed as follows:   History of obesity  Non compliance w medication regimen    Orders: No orders of the defined types were placed in this encounter.  No orders of the defined types were placed in this encounter.   Face-to-face time spent with patient was 30 minutes. >50% of time was spent in counseling and coordination of care.  Follow-Up Instructions: Return in about 5 months (around 10/01/2018) for Rheumatoid arthritis.   Ofilia Neas, PA-C   I examined and evaluated the patient with Hazel Sams PA.  Patient has been having episodic joint pain.  We had detailed discussion regarding different treatment options.  She does not want to change her therapy.  At this time she will continue methotrexate.  The elevated LFTs were related to Flagyl most likely.  The plan of care was discussed as noted above.  Bo Merino, MD Note - This record has been created using Editor, commissioning.  Chart creation errors have been sought, but may not always  have been located. Such creation errors do not reflect on  the standard of medical care.

## 2018-04-21 ENCOUNTER — Telehealth: Payer: Self-pay | Admitting: Rheumatology

## 2018-04-21 NOTE — Telephone Encounter (Signed)
Patient request refill on MTX sent to [facility] on [address].

## 2018-04-24 MED ORDER — METHOTREXATE 2.5 MG PO TABS: 17.5000 mg | ORAL_TABLET | ORAL | 0 refills | Status: DC

## 2018-04-24 NOTE — Telephone Encounter (Signed)
Last Visit: 11/23/17 Next Visit: 05/01/18 Labs: 04/14/18 LFTs elevated  Okay to refill per Dr. Estanislado Pandy

## 2018-04-25 ENCOUNTER — Telehealth: Payer: Self-pay | Admitting: Rheumatology

## 2018-04-25 NOTE — Telephone Encounter (Signed)
Patient left a message on voicemail requesting a refill on MTX. Patient states she has been out of medication for 2 weeks now. Please call to advise.

## 2018-04-25 NOTE — Telephone Encounter (Signed)
Patient advised prescription was sent to pharmacy on 04/24/18.

## 2018-04-28 ENCOUNTER — Encounter: Payer: Self-pay | Admitting: Family Medicine

## 2018-05-01 ENCOUNTER — Encounter: Payer: Self-pay | Admitting: Rheumatology

## 2018-05-01 ENCOUNTER — Ambulatory Visit (INDEPENDENT_AMBULATORY_CARE_PROVIDER_SITE_OTHER): Payer: 59 | Admitting: Rheumatology

## 2018-05-01 VITALS — BP 129/90 | HR 95 | Ht 62.0 in | Wt 219.0 lb

## 2018-05-01 DIAGNOSIS — M0579 Rheumatoid arthritis with rheumatoid factor of multiple sites without organ or systems involvement: Secondary | ICD-10-CM

## 2018-05-01 DIAGNOSIS — G8929 Other chronic pain: Secondary | ICD-10-CM

## 2018-05-01 DIAGNOSIS — Z79899 Other long term (current) drug therapy: Secondary | ICD-10-CM

## 2018-05-01 DIAGNOSIS — Z8639 Personal history of other endocrine, nutritional and metabolic disease: Secondary | ICD-10-CM | POA: Diagnosis not present

## 2018-05-01 DIAGNOSIS — Z9114 Patient's other noncompliance with medication regimen: Secondary | ICD-10-CM

## 2018-05-01 DIAGNOSIS — M533 Sacrococcygeal disorders, not elsewhere classified: Secondary | ICD-10-CM | POA: Diagnosis not present

## 2018-05-01 NOTE — Patient Instructions (Addendum)
Iliotibial Band Syndrome Rehab Ask your health care provider which exercises are safe for you. Do exercises exactly as told by your health care provider and adjust them as directed. It is normal to feel mild stretching, pulling, tightness, or discomfort as you do these exercises, but you should stop right away if you feel sudden pain or your pain gets worse.Do not begin these exercises until told by your health care provider. Stretching and range of motion exercises These exercises warm up your muscles and joints and improve the movement and flexibility of your hip and pelvis. Exercise A: Quadriceps, prone  1. Lie on your abdomen on a firm surface, such as a bed or padded floor. 2. Bend your left / right knee and hold your ankle. If you cannot reach your ankle or pant leg, loop a belt around your foot and grab the belt instead. 3. Gently pull your heel toward your buttocks. Your knee should not slide out to the side. You should feel a stretch in the front of your thigh and knee. 4. Hold this position for __________ seconds. Repeat __________ times. Complete this stretch __________ times a day. Exercise B: Iliotibial band  1. Lie on your side with your left / right leg in the top position. 2. Bend both of your knees and grab your left / right ankle. Stretch out your bottom arm to help you balance. 3. Slowly bring your top knee back so your thigh goes behind your trunk. 4. Slowly lower your top leg toward the floor until you feel a gentle stretch on the outside of your left / right hip and thigh. If you do not feel a stretch and your knee will not fall farther, place the heel of your other foot on top of your knee and pull your knee down toward the floor with your foot. 5. Hold this position for __________ seconds. Repeat __________ times. Complete this stretch __________ times a day. Strengthening exercises These exercises build strength and endurance in your hip and pelvis. Endurance is the  ability to use your muscles for a long time, even after they get tired. Exercise C: Straight leg raises ( hip abductors) 1. Lie on your side with your left / right leg in the top position. Lie so your head, shoulder, knee, and hip line up. You may bend your bottom knee to help you balance. 2. Roll your hips slightly forward so your hips are stacked directly over each other and your left / right knee is facing forward. 3. Tense the muscles in your outer thigh and lift your top leg 4-6 inches (10-15 cm). 4. Hold this position for __________ seconds. 5. Slowly return to the starting position. Let your muscles relax completely before doing another repetition. Repeat __________ times. Complete this exercise __________ times a day. Exercise D: Straight leg raises ( hip extensors) 1. Lie on your abdomen on your bed or a firm surface. You can put a pillow under your hips if that is more comfortable. 2. Bend your left / right knee so your foot is straight up in the air. 3. Squeeze your buttock muscles and lift your left / right thigh off the bed. Do not let your back arch. 4. Tense this muscle as hard as you can without increasing any knee pain. 5. Hold this position for __________ seconds. 6. Slowly lower your leg to the starting position and allow it to relax completely. Repeat __________ times. Complete this exercise __________ times a day. Exercise E: Hip  5. Hold this position for __________ seconds.  6. Slowly lower your leg to the starting position and allow it to relax completely.  Repeat __________ times. Complete this exercise __________ times a day.  Exercise E: Hip hike  1. Stand sideways on a bottom step. Stand on your left / right leg with your other foot unsupported next to the step. You can hold onto the railing or wall if needed for balance.  2. Keep your knees straight and your torso square. Then, lift your left / right hip up toward the ceiling.  3. Slowly let your left / right hip lower toward the floor, past the starting position. Your foot should get closer to the floor. Do not lean or bend your knees.  Repeat __________ times. Complete this exercise __________ times a day.  This information is not  intended to replace advice given to you by your health care provider. Make sure you discuss any questions you have with your health care provider.  Document Released: 11/08/2005 Document Revised: 07/13/2016 Document Reviewed: 10/10/2015  Elsevier Interactive Patient Education © 2018 [employer]    Iliotibial Band Syndrome  Iliotibial band syndrome (ITBS) is a condition that often causes knee pain. It can also cause pain in the outside of your hip, thigh, and knee. The iliotibial band is a strip of tissue that runs from the outside of your hip and down your thigh to the outside of your knee.  Repeatedly bending and straightening your knee can irritate the iliotibial band.  What are the causes?  This condition is caused by inflammation and irritation from the friction of the iliotibial band moving over the thigh bone (femur) when you repeatedly bend and straighten your knee.  What increases the risk?  This condition is more likely to develop in people who:  · Frequently change elevation during their workouts.  · Run very long distances.  · Recently increased the length or intensity of their workouts.  · Run downhill often, or just started running downhill.  · Ride a bike very far or often.    You may also be at greater risk if you start a new workout routine without first warming up or if you have a job that requires you to bend, squat, or climb frequently.  What are the signs or symptoms?  Symptoms of this condition include:  · Pain along the outside of your knee that may be worse with activity, especially running or going up and down stairs.  · A “snapping” sensation over your knee.  · Swelling on the outside of your knee.  · Pain or a feeling of tightness in your hip.    How is this diagnosed?  This condition is diagnosed based on your symptoms, medical history, and physical exam. You may also see a health care provider who specializes in reducing pain and increasing mobility (physical therapist). A physical  therapist may do an exam to check your balance, movement, and way of walking or running (gait) to see whether the way you move could contribute to your injury. You may also have tests to measure your strength, flexibility, and range of motion.  How is this treated?  Treatment for this condition includes:  · Resting and limiting exercise.  · Returning to activities gradually.  · Doing range-of-motion and strengthening exercises (physical therapy) as told by your health care provider.  · Including low-impact activities, such as swimming, in your exercise routine.    Follow these instructions at home:  ·   provider if:  Your pain does not improve or gets worse despite treatment. This information is not intended to replace advice given to you by your health care provider. Make sure you discuss any questions you have with your health care provider. Document Released: 04/30/2002 Document Revised: 12/10/2016 Document Reviewed: 12/10/2016 Elsevier Interactive Patient Education  2018 [place] We placed an order today for your standing lab work.    Please come back and get your standing labs in 1 month and then every 3 months   We have open lab Monday through Friday from 8:30-11:30 AM and 1:30-4:00 PM  at the office of Dr. Bo Merino.   You may experience shorter wait times on Monday and Friday afternoons. The office is located at [address] [place], [place], [place] [address] No appointment is necessary.   Labs are drawn by [facility].  You may receive a bill from  [facility] for your lab work. If you have any questions regarding directions or hours of operation,  please call [phone].

## 2018-05-02 LAB — COMPLETE METABOLIC PANEL WITH GFR
AG Ratio: 1.8 (calc) (ref 1.0–2.5)
ALKALINE PHOSPHATASE (APISO): 80 U/L (ref 33–115)
ALT: 22 U/L (ref 6–29)
AST: 17 U/L (ref 10–35)
Albumin: 4.4 g/dL (ref 3.6–5.1)
BUN: 9 mg/dL (ref 7–25)
CALCIUM: 9.4 mg/dL (ref 8.6–10.2)
CO2: 30 mmol/L (ref 20–32)
Chloride: 102 mmol/L (ref 98–110)
Creat: 0.77 mg/dL (ref 0.50–1.10)
GFR, Est African American: 107 mL/min/{1.73_m2} (ref >=60)
GFR, Est Non African American: 92 mL/min/{1.73_m2} (ref >=60)
GLUCOSE: 81 mg/dL (ref 65–99)
Globulin: 2.5 g/dL (calc) (ref 1.9–3.7)
Potassium: 4.3 mmol/L (ref 3.5–5.3)
SODIUM: 138 mmol/L (ref 135–146)
Total Bilirubin: 0.4 mg/dL (ref 0.2–1.2)
Total Protein: 6.9 g/dL (ref 6.1–8.1)

## 2018-05-02 NOTE — Progress Notes (Signed)
LFTs WNL. All other lab values are WNL.

## 2018-07-13 ENCOUNTER — Ambulatory Visit: Payer: Managed Care, Other (non HMO) | Admitting: Family Medicine

## 2018-07-18 ENCOUNTER — Ambulatory Visit (INDEPENDENT_AMBULATORY_CARE_PROVIDER_SITE_OTHER): Payer: 59 | Admitting: Family Medicine

## 2018-07-18 ENCOUNTER — Encounter: Payer: Self-pay | Admitting: Family Medicine

## 2018-07-18 ENCOUNTER — Other Ambulatory Visit: Payer: Self-pay | Admitting: Rheumatology

## 2018-07-18 ENCOUNTER — Other Ambulatory Visit: Payer: Self-pay

## 2018-07-18 VITALS — BP 130/86 | HR 84 | Temp 98.9°F | Resp 12 | Ht 62.0 in | Wt 218.1 lb

## 2018-07-18 DIAGNOSIS — Z79899 Other long term (current) drug therapy: Secondary | ICD-10-CM

## 2018-07-18 DIAGNOSIS — E0789 Other specified disorders of thyroid: Secondary | ICD-10-CM

## 2018-07-18 DIAGNOSIS — J0100 Acute maxillary sinusitis, unspecified: Secondary | ICD-10-CM | POA: Diagnosis not present

## 2018-07-18 LAB — COMPLETE METABOLIC PANEL WITH GFR
AG Ratio: 1.7 (calc) (ref 1.0–2.5)
ALBUMIN MSPROF: 4.6 g/dL (ref 3.6–5.1)
ALT: 23 U/L (ref 6–29)
AST: 19 U/L (ref 10–35)
Alkaline phosphatase (APISO): 75 U/L (ref 33–115)
BUN: 12 mg/dL (ref 7–25)
CALCIUM: 9.3 mg/dL (ref 8.6–10.2)
CO2: 25 mmol/L (ref 20–32)
Chloride: 102 mmol/L (ref 98–110)
Creat: 1.05 mg/dL (ref 0.50–1.10)
GFR, EST AFRICAN AMERICAN: 73 mL/min/{1.73_m2} (ref >=60)
GFR, EST NON AFRICAN AMERICAN: 63 mL/min/{1.73_m2} (ref >=60)
GLUCOSE: 91 mg/dL (ref 65–99)
Globulin: 2.7 g/dL (calc) (ref 1.9–3.7)
Potassium: 3.7 mmol/L (ref 3.5–5.3)
Sodium: 137 mmol/L (ref 135–146)
TOTAL PROTEIN: 7.3 g/dL (ref 6.1–8.1)
Total Bilirubin: 0.3 mg/dL (ref 0.2–1.2)

## 2018-07-18 LAB — CBC WITH DIFFERENTIAL/PLATELET
BASOS ABS: 62 {cells}/uL (ref 0–200)
Basophils Relative: 0.6 %
EOS ABS: 291 {cells}/uL (ref 15–500)
Eosinophils Relative: 2.8 %
HEMATOCRIT: 41.1 % (ref 35.0–45.0)
HEMOGLOBIN: 13.7 g/dL (ref 11.7–15.5)
LYMPHS ABS: 2361 {cells}/uL (ref 850–3900)
MCH: 31.4 pg (ref 27.0–33.0)
MCHC: 33.3 g/dL (ref 32.0–36.0)
MCV: 94.1 fL (ref 80.0–100.0)
MPV: 10.4 fL (ref 7.5–12.5)
Monocytes Relative: 7.1 %
Neutro Abs: 6947 cells/uL (ref 1500–7800)
Neutrophils Relative %: 66.8 %
Platelets: 308 10*3/uL (ref 140–400)
RBC: 4.37 10*6/uL (ref 3.80–5.10)
RDW: 13 % (ref 11.0–15.0)
Total Lymphocyte: 22.7 %
WBC mixed population: 738 cells/uL (ref 200–950)
WBC: 10.4 10*3/uL (ref 3.8–10.8)

## 2018-07-18 MED ORDER — AMOXICILLIN-POT CLAVULANATE 875-125 MG PO TABS: 1.0000 | ORAL_TABLET | Freq: Two times a day (BID) | ORAL | 0 refills | Status: DC

## 2018-07-18 NOTE — Patient Instructions (Signed)
mucinex

## 2018-07-18 NOTE — Telephone Encounter (Addendum)
Last Visit: 05/01/18 Next visit: 10/03/18 Labs: 04/14/18 LFTs elevated  Left message to advise patient she is due for labs.  Okay to refill 30 day supply per Dr. Estanislado Pandy

## 2018-07-18 NOTE — Progress Notes (Signed)
Patient ID: [name], female    DOB: 10-Dec-1970, 47 y.o.   MRN: [id number]  Chief Complaint  Patient presents with  . sinus pressure  . Headache    Allergies Contrast media [iodinated diagnostic agents]  Subjective:   [name] is a 47 y.o. female who presents to [facility] today.  HPI Reports that is here for acute visit b/c has had sinus pressure and symptoms for a month. Is having  sinus drainage, nausea, sinus pressure. Has had a bad sinus odor. Has had maxillary sinus pressure and ear pressure. Was tender to touch. mucinex OTC has helped.  No trouble with swallowing.  Has had a mild cough.  No shortness of breath.  No chest pain no skin rashes.  Has never had any issues with her thyroid.  Is not having any difficulty swallowing.  Has not noticed any lymph node swelling.  Has used some over-the-counter Mucinex which did help her symptoms.  Is still on methotrexate by her rheumatologist.    Sinusitis  This is a new problem. The current episode started 1 to 4 weeks ago. The problem has been gradually worsening since onset. There has been no fever. Her pain is at a severity of 5/10. She is experiencing no pain. Associated symptoms include congestion, ear pain and sinus pressure. Pertinent negatives include no chills, coughing, diaphoresis, headaches, hoarse voice, neck pain, shortness of breath, sneezing, sore throat or swollen glands. Past treatments include oral decongestants.    Past Medical History:  Diagnosis Date  . Arthritis   . BV (bacterial vaginosis)   . Constipation 06/16/2016  . HSV (herpes simplex virus) infection   . IUD (intrauterine device) in place 03/20/2014   IUD inserted 01/23/13  . Obesity   . Osteoarthritis   . RA (rheumatoid arthritis) ([place])   . Trichomonas     Past Surgical History:  Procedure Laterality Date  . APPENDECTOMY    . CHOLECYSTECTOMY    . HAND SURGERY    . OVARIAN CYST REMOVAL N/A 08/17/2007  .  TONSILLECTOMY      Family History  Problem Relation Age of Onset  . Diabetes Mother   . COPD Mother   . Arthritis Mother   . Hypertension Mother   . Heart disease Mother   . Asthma Son   . Cancer Maternal Grandmother        lung  . Heart disease Paternal Grandmother        CHF     Social History   Socioeconomic History  . Marital status: Single    Spouse name: Not on file  . Number of children: Not on file  . Years of education: Not on file  . Highest education level: Not on file  Occupational History  . Not on file  Social Needs  . Financial resource strain: Not on file  . Food insecurity:    Worry: Not on file    Inability: Not on file  . Transportation needs:    Medical: Not on file    Non-medical: Not on file  Tobacco Use  . Smoking status: Never Smoker  . Smokeless tobacco: Never Used  Substance and Sexual Activity  . Alcohol use: Yes    Comment: rarely  . Drug use: No  . Sexual activity: Not Currently    Birth control/protection: IUD  Lifestyle  . Physical activity:    Days per week: 0 days    Minutes per session: 0 min  .  Stress: Only a little  Relationships  . Social connections:    Talks on phone: Not on file    Gets together: Not on file    Attends religious service: Not on file    Active member of club or organization: Not on file    Attends meetings of clubs or organizations: Not on file    Relationship status: Not on file  Other Topics Concern  . Not on file  Social History Narrative   Works at a [employer].   Has children.   Does not smoke.   Eats meat fruits and vegetables.   Wears her seatbelt.   Current Outpatient Medications on File Prior to Visit  Medication Sig Dispense Refill  . Apple Cider Vinegar 188 MG CAPS Take by mouth daily.     Marland Kitchen BIOTIN PO Take by mouth at bedtime.    . Biotin w/ Vitamins C & E (HAIR SKIN & NAILS GUMMIES PO) Take by mouth. Chews 2 daily    . Capsicum, Cayenne, (CAYENNE PO) Take by mouth daily.    .  cetirizine (ZYRTEC) 10 MG tablet Take 1 tablet (10 mg total) by mouth daily. One tab daily for allergies 30 tablet 1  . Chromium Picolinate 1000 MCG TABS Take by mouth 2 (two) times daily.    . Cinnamon 500 MG TABS Take by mouth daily.     . diclofenac sodium (VOLTAREN) 1 % GEL APPLY 3 GRAMS TO 3 LARGE JOINTS UP TO 3 TIMES A DAY 300 g 1  . Flaxseed, Linseed, (FLAX SEED OIL) 1000 MG CAPS Take by mouth daily.     . folic acid (FOLVITE) 1 MG tablet Take 2 tablets (2 mg total) by mouth daily. 180 tablet 3  . levonorgestrel (MIRENA) 20 MCG/24HR IUD 1 each by Intrauterine route once.    Marland Kitchen MAGNESIUM PO Take 800 mg by mouth daily.    . methotrexate (RHEUMATREX) 2.5 MG tablet Take by mouth once a week. Caution:Chemotherapy. Protect from light. Takes 8 tabs once a week    . Misc Natural Products (TART CHERRY ADVANCED PO) Take by mouth daily.     . Multiple Vitamins-Calcium (ONE-A-DAY WOMENS PO) Take by mouth daily.    . Phentermine HCl (ADIPEX-P PO) Take by mouth daily.     No current facility-administered medications on file prior to visit.     Review of Systems  Constitutional: Negative for chills and diaphoresis.  HENT: Positive for congestion, ear pain and sinus pressure. Negative for hoarse voice, sneezing and sore throat.   Respiratory: Negative for cough and shortness of breath.   Musculoskeletal: Negative for neck pain.  Neurological: Negative for headaches.   Current Outpatient Medications on File Prior to Visit  Medication Sig Dispense Refill  . Apple Cider Vinegar 188 MG CAPS Take by mouth daily.     Marland Kitchen BIOTIN PO Take by mouth at bedtime.    . Biotin w/ Vitamins C & E (HAIR SKIN & NAILS GUMMIES PO) Take by mouth. Chews 2 daily    . Capsicum, Cayenne, (CAYENNE PO) Take by mouth daily.    . cetirizine (ZYRTEC) 10 MG tablet Take 1 tablet (10 mg total) by mouth daily. One tab daily for allergies 30 tablet 1  . Chromium Picolinate 1000 MCG TABS Take by mouth 2 (two) times daily.    .  Cinnamon 500 MG TABS Take by mouth daily.     . diclofenac sodium (VOLTAREN) 1 % GEL APPLY 3 GRAMS TO 3 LARGE  JOINTS UP TO 3 TIMES A DAY 300 g 1  . Flaxseed, Linseed, (FLAX SEED OIL) 1000 MG CAPS Take by mouth daily.     . folic acid (FOLVITE) 1 MG tablet Take 2 tablets (2 mg total) by mouth daily. 180 tablet 3  . levonorgestrel (MIRENA) 20 MCG/24HR IUD 1 each by Intrauterine route once.    Marland Kitchen MAGNESIUM PO Take 800 mg by mouth daily.    . methotrexate (RHEUMATREX) 2.5 MG tablet Take by mouth once a week. Caution:Chemotherapy. Protect from light. Takes 8 tabs once a week    . Misc Natural Products (TART CHERRY ADVANCED PO) Take by mouth daily.     . Multiple Vitamins-Calcium (ONE-A-DAY WOMENS PO) Take by mouth daily.    . Phentermine HCl (ADIPEX-P PO) Take by mouth daily.     No current facility-administered medications on file prior to visit.      Objective:   BP 130/86 (BP Location: Left Arm, Patient Position: Sitting, Cuff Size: Large)   Pulse 84   Temp 98.9 F (37.2 C) (Temporal)   Resp 12   Ht 5\' 2"  (1.575 m)   Wt 218 lb 1.3 oz (98.9 kg)   SpO2 98% Comment: room air  BMI 39.89 kg/m   Physical Exam  Constitutional: She is oriented to person, place, and time. She appears well-developed and well-nourished. She appears lethargic.  Non-toxic appearance. She does not appear ill.  HENT:  Head: Normocephalic and atraumatic.  Nose: Mucosal edema present. No rhinorrhea or sinus tenderness. No epistaxis.  No foreign bodies. Right sinus exhibits maxillary sinus tenderness. Right sinus exhibits no frontal sinus tenderness. Left sinus exhibits maxillary sinus tenderness. Left sinus exhibits no frontal sinus tenderness.  Mouth/Throat: Uvula is midline, oropharynx is clear and moist and mucous membranes are normal. No oral lesions. No dental abscesses. No oropharyngeal exudate, posterior oropharyngeal edema or posterior oropharyngeal erythema. No tonsillar exudate.  Eyes: Pupils are equal,  round, and reactive to light. EOM are normal. No scleral icterus. Right eye exhibits normal extraocular motion. Left eye exhibits normal extraocular motion. Right pupil is round and reactive. Left pupil is round.  Neck: Normal range of motion and full passive range of motion without pain. Neck supple. No muscular tenderness present. No neck rigidity. No tracheal deviation present. No Brudzinski's sign noted. No thyroid mass present.  Left-sided thyroid fullness present.  Left side greater than right.  No discrete masses or lesions palpated.  Cardiovascular: Normal rate, regular rhythm and normal heart sounds.  Pulmonary/Chest: Effort normal and breath sounds normal. She has no wheezes.  Abdominal: Soft. Bowel sounds are normal.  Musculoskeletal: Normal range of motion. She exhibits no edema.  Lymphadenopathy:    She has no cervical adenopathy.  Neurological: She is oriented to person, place, and time. She has normal strength. She appears lethargic. She displays a negative Romberg sign.  Skin: Skin is warm and dry. Capillary refill takes less than 2 seconds. No rash noted.     Assessment and Plan  1. Acute non-recurrent maxillary sinusitis Treat as directed.  Increase fluids.  Patient counseled in detail regarding the risks of medication. Told to call or return to clinic if develop any worrisome signs or symptoms. Patient voiced understanding.  - amoxicillin-clavulanate (AUGMENTIN) 875-125 MG tablet; Take 1 tablet by mouth 2 (two) times daily.  Dispense: 20 tablet; Refill: 0 -Patient will recheck blood pressure with bariatric clinic in the next couple weeks. 2. Thyroid fullness Discussed exam findings with patient.  Recommend ultrasound  for evaluation.  Patient is asymptomatic at this time.  We will check baseline thyroid blood work for screening.  Follow-up with patient after ultrasound is completed. - TSH - US THYROID; Future  Saline nasal spray as needed.  Call with any questions or  concerns.  Counseled regarding worrisome signs and symptoms and if develop go to the emergency department.  Follow-up with new PCP in 3 months or sooner if needed.  Schedule CPE. No follow-ups on file. Caren Macadam, MD 07/18/2018

## 2018-07-19 NOTE — Progress Notes (Signed)
Labs are WNL.

## 2018-07-23 ENCOUNTER — Emergency Department (HOSPITAL_COMMUNITY): Payer: 59

## 2018-07-23 ENCOUNTER — Other Ambulatory Visit: Payer: Self-pay

## 2018-07-23 ENCOUNTER — Emergency Department (HOSPITAL_COMMUNITY)
Admission: EM | Admit: 2018-07-23 | Discharge: 2018-07-23 | Disposition: A | Discharge status: Home / Self Care | Payer: 59 | Attending: Emergency Medicine | Admitting: Emergency Medicine

## 2018-07-23 ENCOUNTER — Encounter (HOSPITAL_COMMUNITY): Payer: Self-pay | Admitting: Emergency Medicine

## 2018-07-23 DIAGNOSIS — Z79899 Other long term (current) drug therapy: Secondary | ICD-10-CM | POA: Insufficient documentation

## 2018-07-23 DIAGNOSIS — M25512 Pain in left shoulder: Secondary | ICD-10-CM | POA: Insufficient documentation

## 2018-07-23 MED ORDER — TRAMADOL HCL 50 MG PO TABS: 50.0000 mg | ORAL_TABLET | Freq: Four times a day (QID) | ORAL | 0 refills | Status: DC | PRN

## 2018-07-23 MED ORDER — PREDNISONE 20 MG PO TABS
40.0000 mg | ORAL_TABLET | Freq: Once | ORAL | Status: AC
  Administered 2018-07-23: 40 mg via ORAL
  Filled 2018-07-23: qty 2

## 2018-07-23 MED ORDER — KETOROLAC TROMETHAMINE 10 MG PO TABS
10.0000 mg | ORAL_TABLET | Freq: Once | ORAL | Status: AC
  Administered 2018-07-23: 10 mg via ORAL
  Filled 2018-07-23: qty 1

## 2018-07-23 MED ORDER — HYDROCODONE-ACETAMINOPHEN 5-325 MG PO TABS
2.0000 | ORAL_TABLET | Freq: Once | ORAL | Status: AC
  Administered 2018-07-23: 2 via ORAL
  Filled 2018-07-23: qty 2

## 2018-07-23 MED ORDER — DICLOFENAC SODIUM 75 MG PO TBEC: 75.0000 mg | DELAYED_RELEASE_TABLET | Freq: Two times a day (BID) | ORAL | 0 refills | Status: DC

## 2018-07-23 MED ORDER — PROMETHAZINE HCL 12.5 MG PO TABS
12.5000 mg | ORAL_TABLET | Freq: Once | ORAL | Status: AC
  Administered 2018-07-23: 12.5 mg via ORAL
  Filled 2018-07-23: qty 1

## 2018-07-23 MED ORDER — DEXAMETHASONE 4 MG PO TABS: 4.0000 mg | ORAL_TABLET | Freq: Two times a day (BID) | ORAL | 0 refills | Status: DC

## 2018-07-23 NOTE — ED Triage Notes (Signed)
Patient c/o left shoulder pain that started x3 days ago and is progressively getting worse. Denies any injury. Per patient pain with movement and touch. Patient reports taking hydrocodone and aleve with no relief. Per patient has had bursitis in past in which this feels similar.

## 2018-07-23 NOTE — ED Provider Notes (Signed)
[facility] EMERGENCY DEPARTMENT Provider Note   CSN: [id number] Arrival date & time: 07/23/18  1326     History   Chief Complaint Chief Complaint  Patient presents with  . Shoulder Pain    HPI [name] is a 47 y.o. female.  Patient is a 47 year old female who presents to the emergency department with left shoulder pain.  The patient states that about 3 to 4 days ago she began to develop pain in the left shoulder the patient states she has a history of arthritis.  She was thinking that this could be an exacerbation of her rheumatoid.  She does not recall any recent injury or trauma.  She has not had any recent operations or procedures.  The pain seems to be getting worse and not responding to hydrocodone or Aleve.  She is also tried heat but that has not been helpful.  The history is provided by the patient.    Past Medical History:  Diagnosis Date  . Arthritis   . BV (bacterial vaginosis)   . Constipation 06/16/2016  . HSV (herpes simplex virus) infection   . IUD (intrauterine device) in place 03/20/2014   IUD inserted 01/23/13  . Obesity   . Osteoarthritis   . RA (rheumatoid arthritis) ([place])   . Trichomonas     Patient Active Problem List   Diagnosis Date Noted  . Encounter for IUD insertion 01/26/2018  . 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 Well woman exam with routine gynecological exam 07/14/2017  . Multiple acquired skin tags 07/14/2017  . Rheumatoid arthritis with rheumatoid factor of multiple sites without organ or systems involvement ([place]) 11/09/2016  . High risk medication use 11/09/2016  . Non compliance w medication regimen 11/09/2016  . Constipation 06/16/2016  . Obesity 05/16/2015    Past Surgical History:  Procedure Laterality Date  . APPENDECTOMY    . CHOLECYSTECTOMY    . HAND SURGERY    . OVARIAN CYST REMOVAL N/A 08/17/2007  . TONSILLECTOMY       OB History    Gravida  2   Para  1   Term  1   Preterm      AB  1   Living  1      SAB      TAB  1   Ectopic      Multiple      Live Births  1            Home Medications    Prior to Admission medications   Medication Sig Start Date End Date Taking? Authorizing Provider  amoxicillin-clavulanate (AUGMENTIN) 875-125 MG tablet Take 1 tablet by mouth 2 (two) times daily. 07/18/18   Caren Macadam, MD  Apple Cider Vinegar 188 MG CAPS Take by mouth daily.     [provider]  BIOTIN PO Take by mouth at bedtime.    [provider]  Biotin w/ Vitamins C & E (HAIR SKIN & NAILS GUMMIES PO) Take by mouth. Chews 2 daily    [provider]  Capsicum, Cayenne, (CAYENNE PO) Take by mouth daily.    [provider]  cetirizine (ZYRTEC) 10 MG tablet Take 1 tablet (10 mg total) by mouth daily. One tab daily for allergies 08/20/16   Billy Fischer, MD  Chromium Picolinate 1000 MCG TABS Take by mouth 2 (two) times daily.    [provider]  Cinnamon 500 MG TABS Take by mouth daily.     [provider]  diclofenac sodium (VOLTAREN) 1 % GEL APPLY 3 GRAMS TO 3 LARGE JOINTS UP TO 3 TIMES A DAY 02/15/18   Deveshwar, Abel Presto, MD  Flaxseed, Linseed, (FLAX SEED OIL) 1000 MG CAPS Take by mouth daily.     [provider]  folic acid (FOLVITE) 1 MG tablet Take 2 tablets (2 mg total) by mouth daily. 04/04/18   Bo Merino, MD  levonorgestrel (MIRENA) 20 MCG/24HR IUD 1 each by Intrauterine route once.    [provider]  MAGNESIUM PO Take 800 mg by mouth daily.    [provider]  methotrexate (RHEUMATREX) 2.5 MG tablet Take by mouth once a week. Caution:Chemotherapy. Protect from light. Takes 8 tabs once a week    [provider]  methotrexate (RHEUMATREX) 2.5 MG tablet TAKE 7 TABLETS BY MOUTH ONCE WEEK 07/18/18   Bo Merino, MD  Misc Natural Products (TART CHERRY ADVANCED PO) Take by mouth daily.     [provider]  Multiple Vitamins-Calcium (ONE-A-DAY WOMENS PO) Take by mouth  daily.    [provider]  Phentermine HCl (ADIPEX-P PO) Take by mouth daily.    [provider]    Family History Family History  Problem Relation Age of Onset  . Diabetes Mother   . COPD Mother   . Arthritis Mother   . Hypertension Mother   . Heart disease Mother   . Asthma Son   . Cancer Maternal Grandmother        lung  . Heart disease Paternal Grandmother        CHF    Social History Social History   Tobacco Use  . Smoking status: Never Smoker  . Smokeless tobacco: Never Used  Substance Use Topics  . Alcohol use: Yes    Comment: rarely  . Drug use: No     Allergies   Contrast media [iodinated diagnostic agents]   Review of Systems Review of Systems  Constitutional: Negative for activity change.       All ROS Neg except as noted in HPI  HENT: Negative for nosebleeds.   Eyes: Negative for photophobia and discharge.  Respiratory: Negative for cough, shortness of breath and wheezing.   Cardiovascular: Negative for chest pain and palpitations.  Gastrointestinal: Negative for abdominal pain and blood in stool.  Genitourinary: Negative for dysuria, frequency and hematuria.  Musculoskeletal: Positive for arthralgias. Negative for back pain and neck pain.       Shoulder pain  Skin: Negative.   Neurological: Negative for dizziness, seizures and speech difficulty.  Psychiatric/Behavioral: Negative for confusion and hallucinations.     Physical Exam Updated Vital Signs BP 129/82 (BP Location: Right Arm)   Pulse 99   Temp 98 F (36.7 C) (Oral)   Resp 18   Ht 5\' 2"  (1.575 m)   Wt 98.9 kg   SpO2 100%   BMI 39.89 kg/m   Physical Exam  Constitutional: She is oriented to person, place, and time. She appears well-developed and well-nourished.  Non-toxic appearance.  HENT:  Head: Normocephalic.  Right Ear: Tympanic membrane and external ear normal.  Left Ear: Tympanic membrane and external ear normal.  Eyes: Pupils are equal, round, and  reactive to light. EOM and lids are normal.  Neck: Normal range of motion. Neck supple. Carotid bruit is not present.  Cardiovascular: Normal rate, regular rhythm, normal heart sounds, intact distal pulses and normal pulses.  Pulmonary/Chest: Breath sounds normal. No respiratory distress.  Abdominal: Soft. Bowel sounds are normal. There  is no tenderness. There is no guarding.  Musculoskeletal:       Left shoulder: She exhibits decreased range of motion and pain.       Arms: No hot joints appreciated.  No evidence of any dislocation.  Brachial and radial pulse on the left 2+.  Capillary refill is less than 2 seconds.  Lymphadenopathy:       Head (right side): No submandibular adenopathy present.       Head (left side): No submandibular adenopathy present.    She has no cervical adenopathy.  Neurological: She is alert and oriented to person, place, and time. She has normal strength. No cranial nerve deficit or sensory deficit.  Skin: Skin is warm and dry.  Psychiatric: She has a normal mood and affect. Her speech is normal.  Nursing note and vitals reviewed.    ED Treatments / Results  Labs (all labs ordered are listed, but only abnormal results are displayed) Labs Reviewed - No data to display  EKG None  Radiology Dg Shoulder Left  Result Date: 07/23/2018 CLINICAL DATA:  47 year old female with progressive left shoulder pain EXAM: LEFT SHOULDER - 2+ VIEW COMPARISON:  Prior radiographs of the left shoulder 03/26/2006 FINDINGS: There is no evidence of fracture or dislocation. There is no evidence of arthropathy or other focal bone abnormality. Soft tissues are unremarkable. IMPRESSION: Negative. Electronically Signed   By: Jacqulynn Cadet M.D.   On: 07/23/2018 14:34    Procedures Procedures (including critical care time)  Medications Ordered in ED Medications  predniSONE (DELTASONE) tablet 40 mg (has no administration in time range)  HYDROcodone-acetaminophen (NORCO/VICODIN)  5-325 MG per tablet 2 tablet (has no administration in time range)  promethazine (PHENERGAN) tablet 12.5 mg (has no administration in time range)  ketorolac (TORADOL) tablet 10 mg (has no administration in time range)     Initial Impression / Assessment and Plan / ED Course  I have reviewed the triage vital signs and the nursing notes.  Pertinent labs & imaging results that were available during my care of the patient were reviewed by me and considered in my medical decision making (see chart for details).       Final Clinical Impressions(s) / ED Diagnoses MDM Vital signs are within normal limits.  There are no hot joints appreciated on examination at this time.  There is no evidence of a dislocation.  There is no vascular deficits appreciated. X-ray of the shoulder shows no evidence of fracture or dislocation.  Soft tissues are within normal limits.  I suspect that this is an exacerbation of the degenerative changes.  I have asked the patient to discuss this with her rheumatology specialist.  Patient will be treated with diclofenac and Decadron and Ultram over the next few days until the patient can be seen by her primary physician or specialist.  Patient also fitted with a sling to assist with discomfort.   Final diagnoses:  Acute pain of left shoulder    ED Discharge Orders         Ordered    dexamethasone (DECADRON) 4 MG tablet  2 times daily with meals     07/23/18 1627    traMADol (ULTRAM) 50 MG tablet  Every 6 hours PRN     07/23/18 1627    diclofenac (VOLTAREN) 75 MG EC tablet  2 times daily     07/23/18 1627           Lily Kocher, PA-C 07/25/18 1426    [place],  Broadus John, MD 07/26/18 [phone]

## 2018-07-23 NOTE — Discharge Instructions (Addendum)
Your vital signs are within normal limits.  Your oxygen level is 99% on room air.  The x-rays of your shoulder are negative for fracture or fluid in the joint/effusion.  There are no hot joints that would suggest a joint infection.  Your examination favors a musculoskeletal related pain.  Please use Decadron and diclofenac 2 times daily with a meal.  Use Tylenol extra strength every 4 hours for mild pain.  Use 1 or 2 tablets of Ultram for more severe pain.  Please see your rheumatology specialist as soon as possible after the holiday.

## 2018-07-27 ENCOUNTER — Encounter (HOSPITAL_COMMUNITY): Payer: Self-pay | Admitting: Emergency Medicine

## 2018-07-27 ENCOUNTER — Ambulatory Visit (HOSPITAL_COMMUNITY)
Admission: EM | Admit: 2018-07-27 | Discharge: 2018-07-27 | Disposition: A | Discharge status: Home / Self Care | Payer: 59 | Attending: Family Medicine | Admitting: Family Medicine

## 2018-07-27 DIAGNOSIS — R42 Dizziness and giddiness: Secondary | ICD-10-CM

## 2018-07-27 LAB — POCT I-STAT, CHEM 8
BUN: 11 mg/dL (ref 6–20)
CREATININE: 0.8 mg/dL (ref 0.44–1.00)
Calcium, Ion: 1.22 mmol/L (ref 1.15–1.40)
Chloride: 100 mmol/L (ref 98–111)
GLUCOSE: 81 mg/dL (ref 70–99)
HCT: 44 % (ref 36.0–46.0)
Hemoglobin: 15 g/dL (ref 12.0–15.0)
POTASSIUM: 3.6 mmol/L (ref 3.5–5.1)
Sodium: 140 mmol/L (ref 135–145)
TCO2: 27 mmol/L (ref 22–32)

## 2018-07-27 MED ORDER — MECLIZINE HCL 12.5 MG PO TABS: 12.5000 mg | ORAL_TABLET | Freq: Three times a day (TID) | ORAL | 0 refills | Status: DC | PRN

## 2018-07-27 MED ORDER — DIPHENHYDRAMINE HCL 50 MG/ML IJ SOLN
INTRAMUSCULAR | Status: AC
  Filled 2018-07-27: qty 1

## 2018-07-27 MED ORDER — ONDANSETRON 4 MG PO TBDP
4.0000 mg | ORAL_TABLET | Freq: Once | ORAL | Status: AC
  Administered 2018-07-27: 4 mg via ORAL

## 2018-07-27 MED ORDER — FLUTICASONE PROPIONATE 50 MCG/ACT NA SUSP: 1.0000 | Freq: Every day | NASAL | 2 refills | Status: DC

## 2018-07-27 MED ORDER — DIPHENHYDRAMINE HCL 50 MG/ML IJ SOLN
50.0000 mg | Freq: Once | INTRAMUSCULAR | Status: AC
  Administered 2018-07-27: 50 mg via INTRAMUSCULAR

## 2018-07-27 MED ORDER — ONDANSETRON 4 MG PO TBDP
ORAL_TABLET | ORAL | Status: AC
  Filled 2018-07-27: qty 1

## 2018-07-27 NOTE — ED Triage Notes (Signed)
PT reports acute onset of dizziness at 0700 while driving. PT has been treated for vertigo before, but it has been several years. PT reports room spinning sensation. It is improved by being still and covering eyes.

## 2018-07-27 NOTE — ED Provider Notes (Signed)
[facility]    CSN: [id number] Arrival date & time: 07/27/18  9892     History   Chief Complaint Chief Complaint  Patient presents with  . Dizziness    HPI [name] is a 47 y.o. female.   [name] presents with complaints of sudden onset of dizziness like things are spinning. She was driving this morning when it started. No headache, chest pain, palpitations, cough, shortness of breath, vision changes, vomiting or diarrhea. Feels nausea with increased intensity of dizziness. Worsens with movement and with light. Has not fallen. Has had similar episode years ago when she had a sinus infection. Started treatment last week for sinus infection. Stopped the medication for a few days and restarted last night. States she feels her sinus pressure has improved. No known fevers. No recent travel. No leg pain. Doesn't smoke. Has an IUD. Takes vitamins and her abx today, no other medications. No ear pain. No urinary symptoms. No source of bleeding. Hx of constipation, OA, RA.      ROS per HPI.      Past Medical History:  Diagnosis Date  . Arthritis   . BV (bacterial vaginosis)   . Constipation 06/16/2016  . HSV (herpes simplex virus) infection   . IUD (intrauterine device) in place 03/20/2014   IUD inserted 01/23/13  . Obesity   . Osteoarthritis   . RA (rheumatoid arthritis) ([place])   . Trichomonas     Patient Active Problem List   Diagnosis Date Noted  . Encounter for IUD insertion 01/26/2018  . 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 Well woman exam with routine gynecological exam 07/14/2017  . Multiple acquired skin tags 07/14/2017  . Rheumatoid arthritis with rheumatoid factor of multiple sites without organ or systems involvement ([place]) 11/09/2016  . High risk medication use 11/09/2016  . Non compliance w medication regimen 11/09/2016  . Constipation 06/16/2016  . Obesity 05/16/2015    Past Surgical History:  Procedure Laterality Date  . APPENDECTOMY    .  CHOLECYSTECTOMY    . HAND SURGERY    . OVARIAN CYST REMOVAL N/A 08/17/2007  . TONSILLECTOMY      OB History    Gravida  2   Para  1   Term  1   Preterm      AB  1   Living  1     SAB      TAB  1   Ectopic      Multiple      Live Births  1            Home Medications    Prior to Admission medications   Medication Sig Start Date End Date Taking? Authorizing Provider  amoxicillin-clavulanate (AUGMENTIN) 875-125 MG tablet Take 1 tablet by mouth 2 (two) times daily. 07/18/18  Yes Caren Macadam, MD  BIOTIN PO Take by mouth at bedtime.   Yes [provider]  Capsicum, Cayenne, (CAYENNE PO) Take by mouth daily.   Yes [provider]  cetirizine (ZYRTEC) 10 MG tablet Take 1 tablet (10 mg total) by mouth daily. One tab daily for allergies 08/20/16  Yes Kindl, Nelda Severe, MD  dexamethasone (DECADRON) 4 MG tablet Take 1 tablet (4 mg total) by mouth 2 (two) times daily with a meal. 07/23/18  Yes Lily Kocher, PA-C  Flaxseed, Linseed, (FLAX SEED OIL) 1000 MG CAPS Take by mouth daily.    Yes [provider]  folic acid (FOLVITE) 1 MG tablet Take  2 tablets (2 mg total) by mouth daily. 04/04/18  Yes Deveshwar, Abel Presto, MD  MAGNESIUM PO Take 800 mg by mouth daily.   Yes [provider]  methotrexate (RHEUMATREX) 2.5 MG tablet Take by mouth once a week. Caution:Chemotherapy. Protect from light. Takes 8 tabs once a week   Yes [provider]  Misc Natural Products (TART CHERRY ADVANCED PO) Take by mouth daily.    Yes [provider]  Multiple Vitamins-Calcium (ONE-A-DAY WOMENS PO) Take by mouth daily.   Yes [provider]  Phentermine HCl (ADIPEX-P PO) Take by mouth daily.   Yes [provider]  Apple Cider Vinegar 188 MG CAPS Take by mouth daily.     [provider]  Biotin w/ Vitamins C & E (HAIR SKIN & NAILS GUMMIES PO) Take by mouth. Chews 2 daily    [provider]  Chromium Picolinate 1000 MCG  TABS Take by mouth 2 (two) times daily.    [provider]  Cinnamon 500 MG TABS Take by mouth daily.     [provider]  diclofenac (VOLTAREN) 75 MG EC tablet Take 1 tablet (75 mg total) by mouth 2 (two) times daily. 07/23/18   Lily Kocher, PA-C  diclofenac sodium (VOLTAREN) 1 % GEL APPLY 3 GRAMS TO 3 LARGE JOINTS UP TO 3 TIMES A DAY 02/15/18   Bo Merino, MD  fluticasone (FLONASE) 50 MCG/ACT nasal spray Place 1 spray into both nostrils daily. 07/27/18   Zigmund Gottron, NP  levonorgestrel (MIRENA) 20 MCG/24HR IUD 1 each by Intrauterine route once.    [provider]  meclizine (ANTIVERT) 12.5 MG tablet Take 1 tablet (12.5 mg total) by mouth 3 (three) times daily as needed for dizziness. 07/27/18   Zigmund Gottron, NP  methotrexate (RHEUMATREX) 2.5 MG tablet TAKE 7 TABLETS BY MOUTH ONCE WEEK 07/18/18   Bo Merino, MD  traMADol (ULTRAM) 50 MG tablet Take 1 tablet (50 mg total) by mouth every 6 (six) hours as needed. 07/23/18   Lily Kocher, PA-C    Family History Family History  Problem Relation Age of Onset  . Diabetes Mother   . COPD Mother   . Arthritis Mother   . Hypertension Mother   . Heart disease Mother   . Asthma Son   . Cancer Maternal Grandmother        lung  . Heart disease Paternal Grandmother        CHF    Social History Social History   Tobacco Use  . Smoking status: Never Smoker  . Smokeless tobacco: Never Used  Substance Use Topics  . Alcohol use: Yes    Comment: rarely  . Drug use: No     Allergies   Contrast media [iodinated diagnostic agents]   Review of Systems Review of Systems   Physical Exam Triage Vital Signs ED Triage Vitals  Enc Vitals Group     BP 07/27/18 0843 (!) 145/89     Pulse Rate 07/27/18 0843 69     Resp 07/27/18 0843 20     Temp 07/27/18 0843 97.9 F (36.6 C)     Temp Source 07/27/18 0843 Oral     SpO2 07/27/18 0843 98 %     Weight 07/27/18 0849 218 lb (98.9 kg)     Height --       Head Circumference --      Peak Flow --      Pain Score 07/27/18 0849 0  Pain Loc --      Pain Edu? --      Excl. in [place]? --    Orthostatic VS for the past 24 hrs:  BP- Lying Pulse- Lying BP- Sitting Pulse- Sitting BP- Standing at 0 minutes Pulse- Standing at 0 minutes  07/27/18 0904 147/89 68 144/87 70 141/88 72    Updated Vital Signs BP (!) 145/89 (BP Location: Right Arm)   Pulse 69   Temp 97.9 F (36.6 C) (Oral)   Resp 20   Wt 218 lb (98.9 kg)   SpO2 98%   BMI 39.87 kg/m    Physical Exam  Constitutional: She is oriented to person, place, and time. She appears well-developed and well-nourished. She appears ill. No distress.  HENT:  Head: Normocephalic and atraumatic.  Right Ear: Tympanic membrane, external ear and ear canal normal.  Left Ear: Tympanic membrane, external ear and ear canal normal.  Nose: Nose normal. Right sinus exhibits no maxillary sinus tenderness and no frontal sinus tenderness. Left sinus exhibits no maxillary sinus tenderness and no frontal sinus tenderness.  Mouth/Throat: Uvula is midline, oropharynx is clear and moist and mucous membranes are normal. No tonsillar exudate.  Eyes: Pupils are equal, round, and reactive to light. Conjunctivae and EOM are normal.  Cardiovascular: Normal rate, regular rhythm and normal heart sounds.  Pulmonary/Chest: Effort normal and breath sounds normal.  Neurological: She is alert and oriented to person, place, and time. She has normal strength. She is not disoriented. No cranial nerve deficit or sensory deficit. Gait normal. GCS eye subscore is 4. GCS verbal subscore is 5. GCS motor subscore is 6.  Increased symptoms with positional changes  Skin: Skin is warm and dry.   EKG NSR rate 63 without acute changes.   UC Treatments / Results  Labs (all labs ordered are listed, but only abnormal results are displayed) Labs Reviewed  POCT I-STAT, CHEM 8    EKG None  Radiology No results  found.  Procedures Procedures (including critical care time)  Medications Ordered in UC Medications  ondansetron (ZOFRAN-ODT) disintegrating tablet 4 mg (has no administration in time range)  diphenhydrAMINE (BENADRYL) injection 50 mg (has no administration in time range)    Initial Impression / Assessment and Plan / UC Course  I have reviewed the triage vital signs and the nursing notes.  Pertinent labs & imaging results that were available during my care of the patient were reviewed by me and considered in my medical decision making (see chart for details).     Chem 8, ekg and orthostatics reassuring today. Neurological exam without red flag findings. Benadryl and zofran provided. Encouraged fluids, rest, meclizine tonight as needed, flonase daily, complete augmentin. Return precautions provided. Patient verbalized understanding and agreeable to plan. Ambulatory out of clinic with friend assist who is driving her home.   Final Clinical Impressions(s) / UC Diagnoses   Final diagnoses:  Vertigo     Discharge Instructions     Push fluids to ensure adequate hydration and keep secretions thin May use meclizine as needed for any persistent symptoms. Do not take for another 8 hours. May cause drowsiness. Please do not take if driving or drinking alcohol.   Daily flonase.  Please continue to complete course of antibiotics.  Please follow up with PCP in the next week.  If develop worsening of dizziness, weakness, nausea, vomiting, headache, vision change or worsening please return or go to the Er.      ED Prescriptions  Medication Sig Dispense Auth. Provider   meclizine (ANTIVERT) 12.5 MG tablet Take 1 tablet (12.5 mg total) by mouth 3 (three) times daily as needed for dizziness. 30 tablet Augusto Gamble B, NP   fluticasone (FLONASE) 50 MCG/ACT nasal spray Place 1 spray into both nostrils daily. 16 g Zigmund Gottron, NP     Controlled Substance Prescriptions [place] Controlled  Substance Registry consulted? Not Applicable   Zigmund Gottron, NP 07/27/18 [phone]

## 2018-07-27 NOTE — Discharge Instructions (Addendum)
Push fluids to ensure adequate hydration and keep secretions thin May use meclizine as needed for any persistent symptoms. Do not take for another 8 hours. May cause drowsiness. Please do not take if driving or drinking alcohol.   Daily flonase.  Please continue to complete course of antibiotics.  Please follow up with PCP in the next week.  If develop worsening of dizziness, weakness, nausea, vomiting, headache, vision change or worsening please return or go to the Er.

## 2018-08-09 ENCOUNTER — Other Ambulatory Visit: Payer: Self-pay | Admitting: Rheumatology

## 2018-08-09 NOTE — Telephone Encounter (Signed)
Last Visit: 05/01/18 Next visit: 10/03/18 Labs: 07/18/18 WNL  Okay to refill per Dr. Estanislado Pandy

## 2018-08-17 ENCOUNTER — Ambulatory Visit (HOSPITAL_COMMUNITY): Payer: 59

## 2018-08-25 ENCOUNTER — Ambulatory Visit (HOSPITAL_COMMUNITY)
Admission: RE | Admit: 2018-08-25 | Discharge: 2018-08-25 | Disposition: A | Discharge status: Home / Self Care | Payer: 59 | Source: Ambulatory Visit | Attending: Family Medicine | Admitting: Family Medicine

## 2018-08-25 DIAGNOSIS — E041 Nontoxic single thyroid nodule: Secondary | ICD-10-CM | POA: Diagnosis not present

## 2018-08-25 DIAGNOSIS — E0789 Other specified disorders of thyroid: Secondary | ICD-10-CM | POA: Diagnosis present

## 2018-09-19 NOTE — Progress Notes (Deleted)
Office Visit Note  Patient: [name]             Date of Birth: 07-07-1971           MRN: [id number]             PCP: Caren Macadam, MD Referring: Caren Macadam, MD Visit Date: 10/03/2018 Occupation: @GUAROCC @  Subjective:  No chief complaint on file.  Current regimen methotrexate 17.5 mg po weekly and folic acid 2 mg po daily.  Most recent CBC/CMP within normal limits on 07/18/18.  Due for CBC/CMP end of this month and then every 3 months.  Standing orders are in place. Recommend annual flu and Pneumovax 23.   No x-rays on file.   History of Present Illness: [name] is a 47 y.o. female with history of seropositive rheumatoid arthritis.   Activities of Daily Living:  Patient reports morning stiffness for *** {minute/hour:19697}.   Patient {ACTIONS;DENIES/REPORTS:21021675::"Denies"} nocturnal pain.  Difficulty dressing/grooming: {ACTIONS;DENIES/REPORTS:21021675::"Denies"} Difficulty climbing stairs: {ACTIONS;DENIES/REPORTS:21021675::"Denies"} Difficulty getting out of chair: {ACTIONS;DENIES/REPORTS:21021675::"Denies"} Difficulty using hands for taps, buttons, cutlery, and/or writing: {ACTIONS;DENIES/REPORTS:21021675::"Denies"}  No Rheumatology ROS completed.   PMFS History:  Patient Active Problem List   Diagnosis Date Noted  . Encounter for IUD insertion 01/26/2018  . 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 Well woman exam with routine gynecological exam 07/14/2017  . Multiple acquired skin tags 07/14/2017  . Rheumatoid arthritis with rheumatoid factor of multiple sites without organ or systems involvement ([place]) 11/09/2016  . High risk medication use 11/09/2016  . Non compliance w medication regimen 11/09/2016  . Constipation 06/16/2016  . Obesity 05/16/2015    Past Medical History:  Diagnosis Date  . Arthritis   . BV (bacterial vaginosis)   . Constipation 06/16/2016  . HSV (herpes simplex virus) infection   . IUD (intrauterine device) in place  03/20/2014   IUD inserted 01/23/13  . Obesity   . Osteoarthritis   . RA (rheumatoid arthritis) ([place])   . Trichomonas     Family History  Problem Relation Age of Onset  . Diabetes Mother   . COPD Mother   . Arthritis Mother   . Hypertension Mother   . Heart disease Mother   . Asthma Son   . Cancer Maternal Grandmother        lung  . Heart disease Paternal Grandmother        CHF   Past Surgical History:  Procedure Laterality Date  . APPENDECTOMY    . CHOLECYSTECTOMY    . HAND SURGERY    . OVARIAN CYST REMOVAL N/A 08/17/2007  . TONSILLECTOMY     Social History   Social History Narrative   Works at a [employer].   Has children.   Does not smoke.   Eats meat fruits and vegetables.   Wears her seatbelt.    Objective: Vital Signs: There were no vitals taken for this visit.   Physical Exam   Musculoskeletal Exam: ***  CDAI Exam: CDAI Score: Not documented Patient Global Assessment: Not documented; Provider Global Assessment: Not documented Swollen: Not documented; Tender: Not documented Joint Exam   Not documented   There is currently no information documented on the homunculus. Go to the Rheumatology activity and complete the homunculus joint exam.  Investigation: No additional findings.  Imaging: US Thyroid  Result Date: 08/25/2018 CLINICAL DATA:  Thyroid fullness EXAM: THYROID ULTRASOUND TECHNIQUE: Ultrasound examination of the thyroid gland and adjacent soft tissues was performed. COMPARISON:  None. FINDINGS: Parenchymal Echotexture:  Mildly heterogenous Isthmus: 6 mm Right lobe: 4.4 x 1.7 x 2.0 cm Left lobe: 5.0 x 2.8 x 2.0 cm _________________________________________________________ Estimated total number of nodules >/= 1 cm: 2 Number of spongiform nodules >/=  2 cm not described below (TR1): 0 Number of mixed cystic and solid nodules >/= 1.5 cm not described below (TR2): 0 _________________________________________________________ Nodule # 1: Location: Right;  Mid Maximum size: 1.1 cm; Other 2 dimensions: 1.0 x 1.0 cm Composition: solid/almost completely solid (2) Echogenicity: hyperechoic (1) Shape: not taller-than-wide (0) Margins: ill-defined (0) Echogenic foci: none (0) ACR TI-RADS total points: 3. ACR TI-RADS risk category: TR3 (3 points). ACR TI-RADS recommendations: Given size (<1.4 cm) and appearance, this nodule does NOT meet TI-RADS criteria for biopsy or dedicated follow-up. _________________________________________________________ Nodule # 3: Location: Left; Mid Maximum size: 1.8 cm; Other 2 dimensions: 1.8 x 1.4 cm Composition: mixed cystic and solid (1) Echogenicity: hyperechoic (1) Shape: taller-than-wide (3) Margins: ill-defined (0) Echogenic foci: none (0) ACR TI-RADS total points: 4. ACR TI-RADS risk category: TR4 (4-6 points). ACR TI-RADS recommendations: **Given size (>/= 1.5 cm) and appearance, fine needle aspiration of this moderately suspicious nodule should be considered based on TI-RADS criteria. _________________________________________________________ There is an additional subcentimeter right thyroid midpole hypoechoic nodule adjacent to the isthmus measuring 8 mm in greatest dimension. This would not meet criteria for any biopsy or follow-up. No adenopathy. IMPRESSION: 1.8 cm left mid thyroid TR 4 nodule meets criteria for biopsy as above. 1.1 cm right mid thyroid TR 3 nodule does not meet criteria for any biopsy or follow-up. The above is in keeping with the ACR TI-RADS recommendations - J Am Coll Radiol 2017;14:587-595. Electronically Signed   By: Jerilynn Mages.  Shick M.D.   On: 08/25/2018 17:06    Recent Labs: Lab Results  Component Value Date   WBC 10.4 07/18/2018   HGB 15.0 07/27/2018   PLT 308 07/18/2018   NA 140 07/27/2018   K 3.6 07/27/2018   CL 100 07/27/2018   CO2 25 07/18/2018   GLUCOSE 81 07/27/2018   BUN 11 07/27/2018   CREATININE 0.80 07/27/2018   BILITOT 0.3 07/18/2018   ALKPHOS 75 06/17/2017   AST 19 07/18/2018   ALT 23  07/18/2018   PROT 7.3 07/18/2018   ALBUMIN 4.5 06/17/2017   CALCIUM 9.3 07/18/2018   GFRAA 73 07/18/2018    Speciality Comments: No specialty comments available.  Procedures:  No procedures performed Allergies: Contrast media [iodinated diagnostic agents]   Assessment / Plan:     Visit Diagnoses: No diagnosis found.   Orders: No orders of the defined types were placed in this encounter.  No orders of the defined types were placed in this encounter.   Face-to-face time spent with patient was *** minutes. Greater than 50% of time was spent in counseling and coordination of care.  Follow-Up Instructions: No follow-ups on file.   Earnestine Mealing, CMA  Note - This record has been created using Editor, commissioning.  Chart creation errors have been sought, but may not always  have been located. Such creation errors do not reflect on  the standard of medical care.

## 2018-09-21 ENCOUNTER — Other Ambulatory Visit: Payer: Self-pay

## 2018-09-21 ENCOUNTER — Encounter (HOSPITAL_COMMUNITY): Payer: Self-pay

## 2018-09-21 ENCOUNTER — Ambulatory Visit (HOSPITAL_COMMUNITY)
Admission: EM | Admit: 2018-09-21 | Discharge: 2018-09-21 | Disposition: A | Discharge status: Home / Self Care | Payer: 59 | Attending: Family Medicine | Admitting: Family Medicine

## 2018-09-21 DIAGNOSIS — Z833 Family history of diabetes mellitus: Secondary | ICD-10-CM | POA: Diagnosis not present

## 2018-09-21 DIAGNOSIS — Z9049 Acquired absence of other specified parts of digestive tract: Secondary | ICD-10-CM | POA: Insufficient documentation

## 2018-09-21 DIAGNOSIS — Z9889 Other specified postprocedural states: Secondary | ICD-10-CM | POA: Insufficient documentation

## 2018-09-21 DIAGNOSIS — E669 Obesity, unspecified: Secondary | ICD-10-CM | POA: Diagnosis not present

## 2018-09-21 DIAGNOSIS — J029 Acute pharyngitis, unspecified: Secondary | ICD-10-CM | POA: Insufficient documentation

## 2018-09-21 DIAGNOSIS — Z79899 Other long term (current) drug therapy: Secondary | ICD-10-CM | POA: Insufficient documentation

## 2018-09-21 DIAGNOSIS — Z6841 Body Mass Index (BMI) 40.0 and over, adult: Secondary | ICD-10-CM | POA: Diagnosis not present

## 2018-09-21 DIAGNOSIS — J0100 Acute maxillary sinusitis, unspecified: Secondary | ICD-10-CM

## 2018-09-21 DIAGNOSIS — H66003 Acute suppurative otitis media without spontaneous rupture of ear drum, bilateral: Secondary | ICD-10-CM

## 2018-09-21 DIAGNOSIS — Z792 Long term (current) use of antibiotics: Secondary | ICD-10-CM | POA: Insufficient documentation

## 2018-09-21 LAB — POCT RAPID STREP A: Streptococcus, Group A Screen (Direct): NEGATIVE

## 2018-09-21 MED ORDER — AMOXICILLIN-POT CLAVULANATE 875-125 MG PO TABS: 1.0000 | ORAL_TABLET | Freq: Two times a day (BID) | ORAL | 0 refills | Status: AC

## 2018-09-21 MED ORDER — FLUCONAZOLE 150 MG PO TABS: ORAL_TABLET | ORAL | 0 refills | Status: DC

## 2018-09-21 NOTE — ED Triage Notes (Signed)
Pt c/o sore throat x 1 week

## 2018-09-21 NOTE — ED Provider Notes (Signed)
[facility] [place]    CSN: [id number] Arrival date & time: 09/21/18  1733     History   Chief Complaint Chief Complaint  Patient presents with  . Sore Throat    HPI [name] is a 47 y.o. female.   [name] presents with complaints of raw sore throat, feeling like something is "stuck" in her throat. Started out as a tickle approximately 4 days ago. Now bilateral ears hurt and eyes hurt. No known fever. Travelled on a place recently and has had some ill coworkers. No rash. No gi/gu complaints. Has been using OTC treatments for sore throat, hot teas etc. Hasn't helped. Hx of bv, constipation, obesity, OA, RA. Hx of tonsillectomy.     ROS per HPI.      Past Medical History:  Diagnosis Date  . Arthritis   . BV (bacterial vaginosis)   . Constipation 06/16/2016  . HSV (herpes simplex virus) infection   . IUD (intrauterine device) in place 03/20/2014   IUD inserted 01/23/13  . Obesity   . Osteoarthritis   . RA (rheumatoid arthritis) ([place])   . Trichomonas     Patient Active Problem List   Diagnosis Date Noted  . Encounter for IUD insertion 01/26/2018  . 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 Well woman exam with routine gynecological exam 07/14/2017  . Multiple acquired skin tags 07/14/2017  . Rheumatoid arthritis with rheumatoid factor of multiple sites without organ or systems involvement ([place]) 11/09/2016  . High risk medication use 11/09/2016  . Non compliance w medication regimen 11/09/2016  . Constipation 06/16/2016  . Obesity 05/16/2015    Past Surgical History:  Procedure Laterality Date  . APPENDECTOMY    . CHOLECYSTECTOMY    . HAND SURGERY    . OVARIAN CYST REMOVAL N/A 08/17/2007  . TONSILLECTOMY      OB History    Gravida  2   Para  1   Term  1   Preterm      AB  1   Living  1     SAB      TAB  1   Ectopic      Multiple      Live Births  1            Home Medications    Prior to Admission medications     Medication Sig Start Date End Date Taking? Authorizing Provider  amoxicillin-clavulanate (AUGMENTIN) 875-125 MG tablet Take 1 tablet by mouth 2 (two) times daily for 7 days. 09/21/18 09/28/18  Zigmund Gottron, NP  Apple Cider Vinegar 188 MG CAPS Take by mouth daily.     [provider]  BIOTIN PO Take by mouth at bedtime.    [provider]  Biotin w/ Vitamins C & E (HAIR SKIN & NAILS GUMMIES PO) Take by mouth. Chews 2 daily    [provider]  Capsicum, Cayenne, (CAYENNE PO) Take by mouth daily.    [provider]  cetirizine (ZYRTEC) 10 MG tablet Take 1 tablet (10 mg total) by mouth daily. One tab daily for allergies 08/20/16   Billy Fischer, MD  Chromium Picolinate 1000 MCG TABS Take by mouth 2 (two) times daily.    [provider]  Cinnamon 500 MG TABS Take by mouth daily.     [provider]  dexamethasone (DECADRON) 4 MG tablet Take 1 tablet (4 mg total) by mouth 2 (two) times daily with a meal. 07/23/18   Marshell Levan,  [place], PA-C  diclofenac (VOLTAREN) 75 MG EC tablet Take 1 tablet (75 mg total) by mouth 2 (two) times daily. 07/23/18   Lily Kocher, PA-C  diclofenac sodium (VOLTAREN) 1 % GEL APPLY 3 GRAMS TO 3 LARGE JOINTS UP TO 3 TIMES A DAY 02/15/18   Deveshwar, Abel Presto, MD  Flaxseed, Linseed, (FLAX SEED OIL) 1000 MG CAPS Take by mouth daily.     [provider]  fluconazole (DIFLUCAN) 150 MG tablet At completion of antibiotics 09/21/18   Zigmund Gottron, NP  fluticasone (FLONASE) 50 MCG/ACT nasal spray Place 1 spray into both nostrils daily. 07/27/18   Zigmund Gottron, NP  folic acid (FOLVITE) 1 MG tablet Take 2 tablets (2 mg total) by mouth daily. 04/04/18   Bo Merino, MD  levonorgestrel (MIRENA) 20 MCG/24HR IUD 1 each by Intrauterine route once.    [provider]  MAGNESIUM PO Take 800 mg by mouth daily.    [provider]  meclizine (ANTIVERT) 12.5 MG tablet Take 1 tablet (12.5 mg total) by mouth 3  (three) times daily as needed for dizziness. 07/27/18   Zigmund Gottron, NP  methotrexate (RHEUMATREX) 2.5 MG tablet Take by mouth once a week. Caution:Chemotherapy. Protect from light. Takes 8 tabs once a week    [provider]  methotrexate (RHEUMATREX) 2.5 MG tablet TAKE 7 TABLETS BY MOUTH ONCE WEEK 08/09/18   Bo Merino, MD  Misc Natural Products (TART CHERRY ADVANCED PO) Take by mouth daily.     [provider]  Multiple Vitamins-Calcium (ONE-A-DAY WOMENS PO) Take by mouth daily.    [provider]  Phentermine HCl (ADIPEX-P PO) Take by mouth daily.    [provider]  traMADol (ULTRAM) 50 MG tablet Take 1 tablet (50 mg total) by mouth every 6 (six) hours as needed. 07/23/18   Lily Kocher, PA-C    Family History Family History  Problem Relation Age of Onset  . Diabetes Mother   . COPD Mother   . Arthritis Mother   . Hypertension Mother   . Heart disease Mother   . Asthma Son   . Cancer Maternal Grandmother        lung  . Heart disease Paternal Grandmother        CHF    Social History Social History   Tobacco Use  . Smoking status: Never Smoker  . Smokeless tobacco: Never Used  Substance Use Topics  . Alcohol use: Yes    Comment: rarely  . Drug use: No     Allergies   Contrast media [iodinated diagnostic agents]   Review of Systems Review of Systems   Physical Exam Triage Vital Signs ED Triage Vitals  Enc Vitals Group     BP 09/21/18 1750 137/88     Pulse Rate 09/21/18 1750 87     Resp 09/21/18 1750 16     Temp 09/21/18 1750 97.8 F (36.6 C)     Temp Source 09/21/18 1750 Oral     SpO2 09/21/18 1750 100 %     Weight 09/21/18 1748 220 lb (99.8 kg)     Height --      Head Circumference --      Peak Flow --      Pain Score 09/21/18 1748 8     Pain Loc --      Pain Edu? --      Excl. in [place]? --    No data found.  Updated Vital Signs BP 137/88 (BP  Location: Right Arm)   Pulse 87   Temp 97.8 F (36.6 C)  (Oral)   Resp 16   Wt 220 lb (99.8 kg)   SpO2 100%   BMI 40.24 kg/m   Visual Acuity Right Eye Distance:   Left Eye Distance:   Bilateral Distance:    Right Eye Near:   Left Eye Near:    Bilateral Near:     Physical Exam  Constitutional: She is oriented to person, place, and time. She appears well-developed and well-nourished. No distress.  HENT:  Head: Normocephalic and atraumatic.  Right Ear: External ear and ear canal normal. Tympanic membrane is erythematous. A middle ear effusion is present.  Left Ear: External ear and ear canal normal. Tympanic membrane is erythematous. A middle ear effusion is present.  Nose: Nose normal.  Mouth/Throat: Uvula is midline, oropharynx is clear and moist and mucous membranes are normal. Tonsils are 0 on the right. Tonsils are 0 on the left. No tonsillar exudate.  Eyes: Pupils are equal, round, and reactive to light. Conjunctivae and EOM are normal.  Cardiovascular: Normal rate, regular rhythm and normal heart sounds.  Pulmonary/Chest: Effort normal and breath sounds normal.  Lymphadenopathy:    She has cervical adenopathy.  Neurological: She is alert and oriented to person, place, and time.  Skin: Skin is warm and dry.     UC Treatments / Results  Labs (all labs ordered are listed, but only abnormal results are displayed) Labs Reviewed  CULTURE, GROUP A STREP [facility])  POCT RAPID STREP A    EKG None  Radiology No results found.  Procedures Procedures (including critical care time)  Medications Ordered in UC Medications - No data to display  Initial Impression / Assessment and Plan / UC Course  I have reviewed the triage vital signs and the nursing notes.  Pertinent labs & imaging results that were available during my care of the patient were reviewed by me and considered in my medical decision making (see chart for details).     Throat appears quite well on exam, hx of tonsillectomy. No fevers. Noted frequent throat  clearing. Ears with mild infection on exam, will treat due to persistent and worsening of symptoms. Negative rapid strep, culture pending. If symptoms worsen or do not improve in the next week to return to be seen or to follow up with PCP.  Patient verbalized understanding and agreeable to plan.   Final Clinical Impressions(s) / UC Diagnoses   Final diagnoses:  Acute suppurative otitis media of both ears without spontaneous rupture of tympanic membranes, recurrence not specified     Discharge Instructions     Push fluids to ensure adequate hydration and keep secretions thin.  Tylenol and/or ibuprofen as needed for pain or fevers.  Complete course of antibiotics.  Throat lozenges, gargles, chloraseptic spray, warm teas, popsicles etc to help with throat pain.   If symptoms worsen or do not improve in the next week to return to be seen or to follow up with your PCP.     ED Prescriptions    Medication Sig Dispense Auth. Provider   amoxicillin-clavulanate (AUGMENTIN) 875-125 MG tablet Take 1 tablet by mouth 2 (two) times daily for 7 days. 14 tablet Augusto Gamble B, NP   fluconazole (DIFLUCAN) 150 MG tablet At completion of antibiotics 1 tablet Zigmund Gottron, NP     Controlled Substance Prescriptions [place] Controlled Substance Registry consulted? Not Applicable   Zigmund Gottron, NP 09/21/18 1839

## 2018-09-21 NOTE — Discharge Instructions (Signed)
Push fluids to ensure adequate hydration and keep secretions thin.  Tylenol and/or ibuprofen as needed for pain or fevers.  Complete course of antibiotics.  Throat lozenges, gargles, chloraseptic spray, warm teas, popsicles etc to help with throat pain.   If symptoms worsen or do not improve in the next week to return to be seen or to follow up with your PCP.

## 2018-09-24 LAB — CULTURE, GROUP A STREP (THRC)

## 2018-10-03 ENCOUNTER — Ambulatory Visit: Payer: 59 | Admitting: Rheumatology

## 2018-10-09 ENCOUNTER — Other Ambulatory Visit: Payer: Self-pay | Admitting: Adult Health

## 2018-11-10 ENCOUNTER — Other Ambulatory Visit: Payer: Self-pay | Admitting: Rheumatology

## 2018-11-10 NOTE — Telephone Encounter (Addendum)
Last Visit: 05/01/18 Next visit: 11/21/18 Labs: 07/18/18 WNL  Patient will update her labs at the appointment on 11/21/18  Okay to refill 30 day supply per Dr. Estanislado Pandy

## 2018-11-13 NOTE — Progress Notes (Signed)
Office Visit Note  Patient: [name]             Date of Birth: Sep 30, 1971           MRN: [id number]             PCP: Caren Macadam, MD Referring: Caren Macadam, MD Visit Date: 11/21/2018 Occupation: @GUAROCC @  Subjective:  Left wrist pain   History of Present Illness: [name] is a 47 y.o. female with history of seropositive rheumatoid arthritis.  Patient is on methotrexate 7 tablets by mouth once weekly and folic acid 2 mg by mouth daily.  She has not had any recent flares.  She states that about 1 week ago she had some discomfort in her left wrist but denies any joint swelling or any discomfort at this time.  She states that she typically takes her dose of methotrexate on Tuesdays but was late due to Christmas and took it on Friday.  She feels that postponing the methotrexate dose was why her left wrist was causing increased discomfort.  She says she continues to have some discomfort in bilateral hips that radiates into her groin.  She states that the left is worse than the right.  She denies any difficulty with range of motion or getting out of a car.  She reports that she has some bilateral SI joint pain first thing in the morning as well as if she sits for prolonged periods of time.  She denies any other joint pain or joint swelling at this time.  Activities of Daily Living:  Patient reports morning stiffness for 1 minute.   Patient Denies nocturnal pain.  Difficulty dressing/grooming: Denies Difficulty climbing stairs: Denies Difficulty getting out of chair: Denies Difficulty using hands for taps, buttons, cutlery, and/or writing: Denies  Review of Systems  Constitutional: Negative for fatigue.  HENT: Negative for mouth sores, mouth dryness and nose dryness.   Eyes: Negative for pain, visual disturbance and dryness.  Respiratory: Negative for cough, hemoptysis, shortness of breath and difficulty breathing.   Cardiovascular: Negative for chest pain,  palpitations, hypertension and swelling in legs/feet.  Gastrointestinal: Positive for constipation. Negative for blood in stool and diarrhea.  Endocrine: Negative for increased urination.  Genitourinary: Negative for painful urination.  Musculoskeletal: Positive for arthralgias, joint pain and morning stiffness. Negative for joint swelling, myalgias, muscle weakness, muscle tenderness and myalgias.  Skin: Negative for color change, pallor, rash, hair loss, nodules/bumps, skin tightness, ulcers and sensitivity to sunlight.  Allergic/Immunologic: Negative for susceptible to infections.  Neurological: Negative for dizziness, numbness, headaches and weakness.  Hematological: Negative for swollen glands.  Psychiatric/Behavioral: Negative for depressed mood and sleep disturbance. The patient is not nervous/anxious.     PMFS History:  Patient Active Problem List   Diagnosis Date Noted  . Encounter for IUD insertion 01/26/2018  . 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 Well woman exam with routine gynecological exam 07/14/2017  . Multiple acquired skin tags 07/14/2017  . Rheumatoid arthritis with rheumatoid factor of multiple sites without organ or systems involvement ([place]) 11/09/2016  . High risk medication use 11/09/2016  . Non compliance w medication regimen 11/09/2016  . Constipation 06/16/2016  . Obesity 05/16/2015    Past Medical History:  Diagnosis Date  . Arthritis   . BV (bacterial vaginosis)   . Constipation 06/16/2016  . HSV (herpes simplex virus) infection   . IUD (intrauterine device) in place 03/20/2014   IUD inserted 01/23/13  . Obesity   .  Osteoarthritis   . RA (rheumatoid arthritis) ([place])   . Trichomonas     Family History  Problem Relation Age of Onset  . Diabetes Mother   . COPD Mother   . Arthritis Mother   . Hypertension Mother   . Heart disease Mother   . Asthma Son   . Cancer Maternal Grandmother        lung  . Heart disease Paternal Grandmother        CHF    Past Surgical History:  Procedure Laterality Date  . APPENDECTOMY    . CHOLECYSTECTOMY    . HAND SURGERY    . OVARIAN CYST REMOVAL N/A 08/17/2007  . TONSILLECTOMY     Social History   Social History Narrative   Works at a [employer].   Has children.   Does not smoke.   Eats meat fruits and vegetables.   Wears her seatbelt.    There is no immunization history on file for this patient.  Objective: Vital Signs: BP 130/86 (BP Location: Left Arm, Patient Position: Sitting, Cuff Size: Normal)   Pulse 80   Resp 14   Ht 5\' 2"  (1.575 m)   Wt 224 lb (101.6 kg)   BMI 40.97 kg/m    Physical Exam Vitals signs and nursing note reviewed.  Constitutional:      Appearance: She is well-developed.  HENT:     Head: Normocephalic and atraumatic.  Eyes:     Conjunctiva/sclera: Conjunctivae normal.  Neck:     Musculoskeletal: Normal range of motion.  Cardiovascular:     Rate and Rhythm: Normal rate and regular rhythm.     Heart sounds: Normal heart sounds.  Pulmonary:     Effort: Pulmonary effort is normal.     Breath sounds: Normal breath sounds.  Abdominal:     General: Bowel sounds are normal.     Palpations: Abdomen is soft.  Lymphadenopathy:     Cervical: No cervical adenopathy.  Skin:    General: Skin is warm and dry.     Capillary Refill: Capillary refill takes less than 2 seconds.  Neurological:     Mental Status: She is alert and oriented to person, place, and time.  Psychiatric:        Behavior: Behavior normal.      Musculoskeletal Exam: C-spine, thoracic spine, lumbar spine good range of motion.  No midline spinal tenderness.  No SI joint tenderness.  Shoulder joints, elbow joints, wrist joints, MCPs, PIPs, DIPs good range of motion with no synovitis.  She has complete fist formation bilaterally.  Hip joints, knee joints, ankle joints, MTPs, PIPs, DIPs good range of motion with no synovitis.  No warmth or effusion of bilateral knee joints.  No tenderness or  swelling of ankle joints.  No tenderness of MTP joints.  She is no tenderness over trochanter bursa bilaterally.  CDAI Exam: CDAI Score: 0  Patient Global Assessment: 0 (mm); Provider Global Assessment: 0 (mm) Swollen: 0 ; Tender: 0  Joint Exam   Not documented   There is currently no information documented on the homunculus. Go to the Rheumatology activity and complete the homunculus joint exam.  Investigation: No additional findings.  Imaging: No results found.  Recent Labs: Lab Results  Component Value Date   WBC 10.4 07/18/2018   HGB 15.0 07/27/2018   PLT 308 07/18/2018   NA 140 07/27/2018   K 3.6 07/27/2018   CL 100 07/27/2018   CO2 25 07/18/2018   GLUCOSE 81  07/27/2018   BUN 11 07/27/2018   CREATININE 0.80 07/27/2018   BILITOT 0.3 07/18/2018   ALKPHOS 75 06/17/2017   AST 19 07/18/2018   ALT 23 07/18/2018   PROT 7.3 07/18/2018   ALBUMIN 4.5 06/17/2017   CALCIUM 9.3 07/18/2018   GFRAA 73 07/18/2018    Speciality Comments: No specialty comments available.  Procedures:  No procedures performed Allergies: Contrast media [iodinated diagnostic agents]   Assessment / Plan:     Visit Diagnoses: Rheumatoid arthritis with rheumatoid factor of multiple sites without organ or systems involvement (HCC) - +RF, +CCP: She has no synovitis or tenderness on exam.  She has not had any recent rheumatoid arthritis flares.  She is clinically doing well on methotrexate 7 tablets by mouth once weekly and folic acid 2 mg by mouth daily.  She typically takes her dose of methotrexate on Tuesdays but postponed it until Friday due to Christmas last week.  She developed increased pain in the left wrist but no joint swelling.  She has no tenderness or synovitis on exam today.  She has no joint pain or joint swelling at this time.  She has morning stiffness lasting about 1 minute.  She does not need any refills at this time.  She is advised to notify us if she develops increased joint pain or  joint swelling.  She will follow-up in the office in 5 months.  High risk medication use - MTX, folic acid.  CBC and CMP will be drawn today to monitor for drug toxicity.  CBC and CMP were within normal limits on 07/18/2018.  She will return in March and every 3 months for lab work.  Standing orders are in place.- Plan: CBC with Differential/Platelet, COMPLETE METABOLIC PANEL WITH GFR  Chronic SI joint pain: She has no SI joint tenderness on exam today.  She has intermittent SI joint pain especially after sitting for prolonged peers of time.  Other medical conditions are listed as follows:  History of obesity  Non compliance w medication regimen   Orders: Orders Placed This Encounter  Procedures  . CBC with Differential/Platelet  . COMPLETE METABOLIC PANEL WITH GFR   No orders of the defined types were placed in this encounter.    Follow-Up Instructions: Return in about 5 months (around 04/22/2019) for Rheumatoid arthritis.   Ofilia Neas, PA-C  Note - This record has been created using Dragon software.  Chart creation errors have been sought, but may not always  have been located. Such creation errors do not reflect on  the standard of medical care.

## 2018-11-21 ENCOUNTER — Encounter: Payer: Self-pay | Admitting: Physician Assistant

## 2018-11-21 ENCOUNTER — Ambulatory Visit (INDEPENDENT_AMBULATORY_CARE_PROVIDER_SITE_OTHER): Payer: 59 | Admitting: Physician Assistant

## 2018-11-21 VITALS — BP 130/86 | HR 80 | Resp 14 | Ht 62.0 in | Wt 224.0 lb

## 2018-11-21 DIAGNOSIS — Z79899 Other long term (current) drug therapy: Secondary | ICD-10-CM | POA: Diagnosis not present

## 2018-11-21 DIAGNOSIS — M533 Sacrococcygeal disorders, not elsewhere classified: Secondary | ICD-10-CM

## 2018-11-21 DIAGNOSIS — M0579 Rheumatoid arthritis with rheumatoid factor of multiple sites without organ or systems involvement: Secondary | ICD-10-CM | POA: Diagnosis not present

## 2018-11-21 DIAGNOSIS — G8929 Other chronic pain: Secondary | ICD-10-CM

## 2018-11-21 DIAGNOSIS — Z8639 Personal history of other endocrine, nutritional and metabolic disease: Secondary | ICD-10-CM

## 2018-11-21 LAB — CBC WITH DIFFERENTIAL/PLATELET
ABSOLUTE MONOCYTES: 630 {cells}/uL (ref 200–950)
Basophils Absolute: 59 cells/uL (ref 0–200)
Basophils Relative: 0.7 %
EOS ABS: 160 {cells}/uL (ref 15–500)
Eosinophils Relative: 1.9 %
HCT: 41.9 % (ref 35.0–45.0)
HEMOGLOBIN: 14.2 g/dL (ref 11.7–15.5)
Lymphs Abs: 1823 cells/uL (ref 850–3900)
MCH: 31.7 pg (ref 27.0–33.0)
MCHC: 33.9 g/dL (ref 32.0–36.0)
MCV: 93.5 fL (ref 80.0–100.0)
MPV: 10.2 fL (ref 7.5–12.5)
Monocytes Relative: 7.5 %
NEUTROS ABS: 5729 {cells}/uL (ref 1500–7800)
Neutrophils Relative %: 68.2 %
Platelets: 297 10*3/uL (ref 140–400)
RBC: 4.48 10*6/uL (ref 3.80–5.10)
RDW: 12.4 % (ref 11.0–15.0)
Total Lymphocyte: 21.7 %
WBC: 8.4 10*3/uL (ref 3.8–10.8)

## 2018-11-21 LAB — COMPLETE METABOLIC PANEL WITH GFR
AG RATIO: 1.6 (calc) (ref 1.0–2.5)
ALBUMIN MSPROF: 4.5 g/dL (ref 3.6–5.1)
ALT: 24 U/L (ref 6–29)
AST: 19 U/L (ref 10–35)
Alkaline phosphatase (APISO): 79 U/L (ref 33–115)
BILIRUBIN TOTAL: 0.5 mg/dL (ref 0.2–1.2)
BUN: 9 mg/dL (ref 7–25)
CALCIUM: 9.3 mg/dL (ref 8.6–10.2)
CHLORIDE: 101 mmol/L (ref 98–110)
CO2: 28 mmol/L (ref 20–32)
Creat: 0.71 mg/dL (ref 0.50–1.10)
GFR, EST AFRICAN AMERICAN: 118 mL/min/{1.73_m2} (ref >=60)
GFR, EST NON AFRICAN AMERICAN: 101 mL/min/{1.73_m2} (ref >=60)
GLUCOSE: 78 mg/dL (ref 65–99)
Globulin: 2.9 g/dL (calc) (ref 1.9–3.7)
Potassium: 4.1 mmol/L (ref 3.5–5.3)
Sodium: 136 mmol/L (ref 135–146)
TOTAL PROTEIN: 7.4 g/dL (ref 6.1–8.1)

## 2018-11-23 NOTE — Progress Notes (Signed)
CBC and CMP WNL

## 2018-12-07 ENCOUNTER — Other Ambulatory Visit: Payer: Self-pay | Admitting: Rheumatology

## 2018-12-13 ENCOUNTER — Ambulatory Visit (INDEPENDENT_AMBULATORY_CARE_PROVIDER_SITE_OTHER): Payer: 59 | Admitting: Adult Health

## 2018-12-13 ENCOUNTER — Other Ambulatory Visit (HOSPITAL_COMMUNITY)
Admission: RE | Admit: 2018-12-13 | Discharge: 2018-12-13 | Disposition: A | Discharge status: Home / Self Care | Payer: 59 | Source: Ambulatory Visit | Attending: Adult Health | Admitting: Adult Health

## 2018-12-13 ENCOUNTER — Encounter: Payer: Self-pay | Admitting: Adult Health

## 2018-12-13 VITALS — BP 121/85 | HR 88 | Ht 62.5 in | Wt 219.0 lb

## 2018-12-13 DIAGNOSIS — E042 Nontoxic multinodular goiter: Secondary | ICD-10-CM

## 2018-12-13 DIAGNOSIS — Z1211 Encounter for screening for malignant neoplasm of colon: Secondary | ICD-10-CM

## 2018-12-13 DIAGNOSIS — Z1212 Encounter for screening for malignant neoplasm of rectum: Secondary | ICD-10-CM

## 2018-12-13 DIAGNOSIS — Z01419 Encounter for gynecological examination (general) (routine) without abnormal findings: Secondary | ICD-10-CM | POA: Diagnosis present

## 2018-12-13 DIAGNOSIS — L659 Nonscarring hair loss, unspecified: Secondary | ICD-10-CM

## 2018-12-13 DIAGNOSIS — Z975 Presence of (intrauterine) contraceptive device: Secondary | ICD-10-CM

## 2018-12-13 DIAGNOSIS — K5900 Constipation, unspecified: Secondary | ICD-10-CM

## 2018-12-13 LAB — HEMOCCULT GUIAC POC 1CARD (OFFICE): FECAL OCCULT BLD: NEGATIVE

## 2018-12-13 MED ORDER — LINACLOTIDE 145 MCG PO CAPS: 145.0000 ug | ORAL_CAPSULE | Freq: Every day | ORAL | 1 refills | Status: DC

## 2018-12-13 NOTE — Progress Notes (Signed)
Patient ID: [name], female   DOB: 08-23-1971, 48 y.o.   MRN: [id number] History of Present Illness:  [name] is a 48 year old black female, single in for well woman gyn exam and pap. No current PCP, sees Dr Matthew Saras for RA.   Current Medications, Allergies, Past Medical History, Past Surgical History, Family History and Social History were reviewed in [employer] record.     Review of Systems: Patient denies any headaches, hearing loss, fatigue, blurred vision, shortness of breath, chest pain, abdominal pain, problems with  urination, or intercourse(not active). No joint pain or mood swings. Can't lose weight +hair loss Is tired after work  Constipated has to use laxatives.   Physical Exam:BP 121/85 (BP Location: Left Arm, Patient Position: Sitting, Cuff Size: Large)   Pulse 88   Ht 5' 2.5" (1.588 m)   Wt 219 lb (99.3 kg)   BMI 39.42 kg/m  General:  Well developed, well nourished, no acute distress Skin:  Warm and dry Neck:  Midline trachea, enlarged thyroid, Had thyroid US in October, has nodules on left and right  and left needs biopsy) good ROM, no lymphadenopathy Lungs; Clear to auscultation bilaterally Breast:  No dominant palpable mass, retraction, or nipple discharge Cardiovascular: Regular rate and rhythm Abdomen:  Soft, non tender, no hepatosplenomegaly Pelvic:  External genitalia is normal in appearance, no lesions.  The vagina is normal in appearance. Urethra has no lesions or masses. The cervix is bulbous. +IUD strings, pap with HPV,GC/CHL performed. Uterus is felt to be normal size, shape, and contour.  No adnexal masses or tenderness noted.Bladder is non tender, no masses felt. Rectal: Good sphincter tone, no polyps, or hemorrhoids felt.  Hemoccult negative. Extremities/musculoskeletal:  No swelling or varicosities noted, no clubbing or cyanosis Psych:  No mood changes, alert and cooperative,seems happy Fall risk is low. PHQ 2 score  0. Examination chaperoned by Kendra Pupa LPN. Will check thyroid labs and schedule FNA thyroid at [facility].  Impression: 1. Encounter for gynecological examination with Papanicolaou smear of cervix   2. Screening for colorectal cancer   3. IUD (intrauterine device) in place   4. Multiple thyroid nodules   5. Constipation, unspecified constipation type   6. Hair loss       Plan: Check TSH and free T4 FNA thyroid 1/30 at 9 am at [facility] Meds ordered this encounter  Medications  . linaclotide (LINZESS) 145 MCG CAPS capsule    Sig: Take 1 capsule (145 mcg total) by mouth daily before breakfast.    Dispense:  30 capsule    Refill:  1    Order Specific Question:   Supervising Provider    Answer:   Florian Buff [[id number]]  Mammogram yearly Physical in 1 year Pap in 3 if normal  Will talk when labs back

## 2018-12-14 ENCOUNTER — Telehealth: Payer: Self-pay | Admitting: Adult Health

## 2018-12-14 LAB — TSH: TSH: 3.86 u[IU]/mL (ref 0.450–4.500)

## 2018-12-14 LAB — T4, FREE: Free T4: 1.11 ng/dL (ref 0.82–1.77)

## 2018-12-14 NOTE — Telephone Encounter (Signed)
Pt aware TSH and free T4 normal

## 2018-12-15 ENCOUNTER — Other Ambulatory Visit: Payer: Self-pay | Admitting: *Deleted

## 2018-12-15 MED ORDER — METHOTREXATE 2.5 MG PO TABS: ORAL_TABLET | ORAL | 0 refills | Status: DC

## 2018-12-15 NOTE — Telephone Encounter (Signed)
Refill request received via fax   Last Visit: 11/21/18 Next Visit: 12/21/18 Labs: 11/21/18 WNL  Okay to refill per Dr. Estanislado Pandy

## 2018-12-18 LAB — CYTOLOGY - PAP
CHLAMYDIA, DNA PROBE: NEGATIVE
Diagnosis: NEGATIVE
HPV (WINDOPATH): NOT DETECTED
NEISSERIA GONORRHEA: NEGATIVE

## 2018-12-19 ENCOUNTER — Telehealth: Payer: Self-pay | Admitting: *Deleted

## 2018-12-19 NOTE — Telephone Encounter (Signed)
Pharmacy made aware Linzess approved by insurance.

## 2018-12-21 ENCOUNTER — Ambulatory Visit (HOSPITAL_COMMUNITY)
Admission: RE | Admit: 2018-12-21 | Discharge: 2018-12-21 | Disposition: A | Discharge status: Home / Self Care | Payer: 59 | Source: Ambulatory Visit | Attending: Adult Health | Admitting: Adult Health

## 2018-12-21 ENCOUNTER — Encounter (HOSPITAL_COMMUNITY): Payer: Self-pay

## 2018-12-21 DIAGNOSIS — E042 Nontoxic multinodular goiter: Secondary | ICD-10-CM | POA: Insufficient documentation

## 2018-12-21 MED ORDER — LIDOCAINE HCL (PF) 2 % IJ SOLN
INTRAMUSCULAR | Status: AC
  Filled 2018-12-21: qty 10

## 2018-12-21 MED ORDER — LIDOCAINE HCL (PF) 2 % IJ SOLN
INTRAMUSCULAR | Status: AC
  Filled 2018-12-21: qty 20

## 2018-12-25 ENCOUNTER — Telehealth: Payer: Self-pay | Admitting: Adult Health

## 2018-12-25 NOTE — Telephone Encounter (Signed)
Pt aware FNA of thyroid showed follicular cells, no further treatment needed

## 2019-01-27 ENCOUNTER — Encounter (HOSPITAL_COMMUNITY): Payer: Self-pay | Admitting: *Deleted

## 2019-01-27 ENCOUNTER — Ambulatory Visit (HOSPITAL_COMMUNITY)
Admission: EM | Admit: 2019-01-27 | Discharge: 2019-01-27 | Disposition: A | Discharge status: Home / Self Care | Payer: 59 | Attending: Family Medicine | Admitting: Family Medicine

## 2019-01-27 ENCOUNTER — Other Ambulatory Visit: Payer: Self-pay

## 2019-01-27 DIAGNOSIS — J0100 Acute maxillary sinusitis, unspecified: Secondary | ICD-10-CM | POA: Diagnosis not present

## 2019-01-27 MED ORDER — ONDANSETRON HCL 4 MG PO TABS: 4.0000 mg | ORAL_TABLET | Freq: Three times a day (TID) | ORAL | 0 refills | Status: DC | PRN

## 2019-01-27 MED ORDER — TRIAMCINOLONE ACETONIDE 55 MCG/ACT NA AERO: 2.0000 | INHALATION_SPRAY | Freq: Every day | NASAL | 12 refills | Status: DC

## 2019-01-27 MED ORDER — AMOXICILLIN-POT CLAVULANATE 875-125 MG PO TABS: 1.0000 | ORAL_TABLET | Freq: Two times a day (BID) | ORAL | 0 refills | Status: AC

## 2019-01-27 NOTE — ED Triage Notes (Signed)
C/O sinus congestion x approx 2.5 wks; had fever up to 101 last wk, lasting 2 days with body aches.  Last night started with sore throat and ear pain.

## 2019-01-27 NOTE — ED Provider Notes (Signed)
[place]    CSN: [id number] Arrival date & time: 01/27/19  1725     History   Chief Complaint Chief Complaint  Patient presents with  . Sore Throat  . Nasal Congestion    HPI SHAUNI HENNER is a 48 y.o. female.   [name] presents with complaints of sore throat, ear pressure and feeling of light headedness. Feels she has some congestion but no nasal drainage. Post nasal drip makes her feel nauseated. Cough which is worse at night. Symptoms have been present for the past 2 weeks with sore throat worsening. No rash. tmax of 100.1. feels her symptoms have somewhat improved actually. No vomiting or diarrhea. No headache. States has a history of sinus issues. Has had tonsils removed. Hx of RA on methotrexate.    ROS per HPI, negative if not otherwise mentioned.      Past Medical History:  Diagnosis Date  . Arthritis   . BV (bacterial vaginosis)   . Constipation 06/16/2016  . HSV (herpes simplex virus) infection   . IUD (intrauterine device) in place 03/20/2014   IUD inserted 01/23/13  . Obesity   . Osteoarthritis   . RA (rheumatoid arthritis) ([place])   . Trichomonas     Patient Active Problem List   Diagnosis Date Noted  . Multiple thyroid nodules 12/13/2018  . IUD (intrauterine device) in place 12/13/2018  . Screening for colorectal cancer 12/13/2018  . Encounter for gynecological examination with Papanicolaou smear of cervix 12/13/2018  . Hair loss 12/13/2018  . Encounter for IUD insertion 01/26/2018  . 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 Well woman exam with routine gynecological exam 07/14/2017  . Multiple acquired skin tags 07/14/2017  . Rheumatoid arthritis with rheumatoid factor of multiple sites without organ or systems involvement ([place]) 11/09/2016  . High risk medication use 11/09/2016  . Non compliance w medication regimen 11/09/2016  . Constipation 06/16/2016  . Obesity 05/16/2015    Past Surgical History:  Procedure Laterality Date  .  APPENDECTOMY    . CHOLECYSTECTOMY    . HAND SURGERY    . OVARIAN CYST REMOVAL N/A 08/17/2007  . TONSILLECTOMY      OB History    Gravida  2   Para  1   Term  1   Preterm      AB  1   Living  1     SAB      TAB  1   Ectopic      Multiple      Live Births  1            Home Medications    Prior to Admission medications   Medication Sig Start Date End Date Taking? Authorizing Provider  Biotin w/ Vitamins C & E (HAIR/SKIN/NAILS PO) Take by mouth daily.   Yes [provider]  Cinnamon 500 MG TABS Take by mouth daily.    Yes [provider]  Flaxseed, Linseed, (FLAX SEED OIL) 1000 MG CAPS Take by mouth daily.    Yes [provider]  folic acid (FOLVITE) 1 MG tablet Take 2 tablets (2 mg total) by mouth daily. 04/04/18  Yes Deveshwar, Abel Presto, MD  levonorgestrel (MIRENA) 20 MCG/24HR IUD 1 each by Intrauterine route once.   Yes [provider]  linaclotide Rolan Lipa) 145 MCG CAPS capsule Take 1 capsule (145 mcg total) by mouth daily before breakfast. 12/13/18  Yes Estill Dooms, NP  meclizine (ANTIVERT) 12.5 MG tablet Take 1 tablet (12.5  mg total) by mouth 3 (three) times daily as needed for dizziness. 07/27/18  Yes Lydon Vansickle, Lanelle Bal B, NP  methotrexate (RHEUMATREX) 2.5 MG tablet TAKE 7 TABLETS BY MOUTH ONCE WEEK 12/15/18  Yes Deveshwar, Abel Presto, MD  Multiple Vitamins-Calcium (ONE-A-DAY WOMENS PO) Take by mouth daily.   Yes [provider]  Phentermine HCl (ADIPEX-P PO) Take by mouth daily.   Yes [provider]  amoxicillin-clavulanate (AUGMENTIN) 875-125 MG tablet Take 1 tablet by mouth every 12 (twelve) hours for 10 days. 01/27/19 02/06/19  Augusto Gamble B, NP  diclofenac sodium (VOLTAREN) 1 % GEL APPLY 3 GRAMS TO 3 LARGE JOINTS UP TO 3 TIMES A DAY 02/15/18   Bo Merino, MD  MAGNESIUM PO Take 800 mg by mouth daily.    [provider]  ondansetron (ZOFRAN) 4 MG tablet Take 1 tablet (4 mg total) by mouth every  8 (eight) hours as needed for nausea or vomiting. 01/27/19   Zigmund Gottron, NP  triamcinolone (NASACORT) 55 MCG/ACT AERO nasal inhaler Place 2 sprays into the nose daily. 01/27/19   Zigmund Gottron, NP    Family History Family History  Problem Relation Age of Onset  . Diabetes Mother   . COPD Mother   . Arthritis Mother   . Hypertension Mother   . Heart disease Mother   . Asthma Son   . Cancer Maternal Grandmother        lung  . Heart disease Paternal Grandmother        CHF    Social History Social History   Tobacco Use  . Smoking status: Never Smoker  . Smokeless tobacco: Never Used  Substance Use Topics  . Alcohol use: Never    Frequency: Never    Comment: rarely  . Drug use: No     Allergies   Contrast media [iodinated diagnostic agents]   Review of Systems Review of Systems   Physical Exam Triage Vital Signs ED Triage Vitals  Enc Vitals Group     BP 01/27/19 1735 136/80     Pulse Rate 01/27/19 1735 95     Resp 01/27/19 1735 16     Temp 01/27/19 1735 98.2 F (36.8 C)     Temp Source 01/27/19 1735 Oral     SpO2 01/27/19 1735 99 %     Weight --      Height --      Head Circumference --      Peak Flow --      Pain Score 01/27/19 1736 0     Pain Loc --      Pain Edu? --      Excl. in [place]? --    No data found.  Updated Vital Signs BP 136/80   Pulse 95   Temp 98.2 F (36.8 C) (Oral)   Resp 16   SpO2 99%    Physical Exam Constitutional:      General: She is not in acute distress.    Appearance: She is well-developed.  HENT:     Head: Normocephalic and atraumatic.     Right Ear: Tympanic membrane, ear canal and external ear normal.     Left Ear: Tympanic membrane, ear canal and external ear normal.     Nose: Mucosal edema and rhinorrhea present.     Mouth/Throat:     Pharynx: Uvula midline.     Tonsils: No tonsillar exudate. Swelling: 0 on the right. 0 on the left.  Eyes:     Conjunctiva/sclera: Conjunctivae  normal.     Pupils: Pupils  are equal, round, and reactive to light.  Cardiovascular:     Rate and Rhythm: Normal rate and regular rhythm.     Heart sounds: Normal heart sounds.  Pulmonary:     Effort: Pulmonary effort is normal.     Breath sounds: Normal breath sounds.  Skin:    General: Skin is warm and dry.  Neurological:     Mental Status: She is alert and oriented to person, place, and time.      UC Treatments / Results  Labs (all labs ordered are listed, but only abnormal results are displayed) Labs Reviewed - No data to display  EKG None  Radiology No results found.  Procedures Procedures (including critical care time)  Medications Ordered in UC Medications - No data to display  Initial Impression / Assessment and Plan / UC Course  I have reviewed the triage vital signs and the nursing notes.  Pertinent labs & imaging results that were available during my care of the patient were reviewed by me and considered in my medical decision making (see chart for details).     2 weeks of symptoms, some symptoms have improved while some have worsened. Will treat sinusitis with augmentin. If symptoms worsen or do not improve in the next week to return to be seen or to follow up with PCP.  Patient verbalized understanding and agreeable to plan.   Final Clinical Impressions(s) / UC Diagnoses   Final diagnoses:  Acute maxillary sinusitis, recurrence not specified     Discharge Instructions     Push fluids to ensure adequate hydration and keep secretions thin.  Throat lozenges, gargles, chloraseptic spray, warm teas, popsicles etc to help with throat pain.   Complete course of antibiotics.  Daily nasal spray.  May need to follow up with ENT if persistent symptoms.    ED Prescriptions    Medication Sig Dispense Auth. Provider   amoxicillin-clavulanate (AUGMENTIN) 875-125 MG tablet Take 1 tablet by mouth every 12 (twelve) hours for 10 days. 20 tablet Augusto Gamble B, NP   triamcinolone  (NASACORT) 55 MCG/ACT AERO nasal inhaler Place 2 sprays into the nose daily. 1 Inhaler Lizbeth Feijoo B, NP   ondansetron (ZOFRAN) 4 MG tablet Take 1 tablet (4 mg total) by mouth every 8 (eight) hours as needed for nausea or vomiting. 10 tablet Zigmund Gottron, NP     Controlled Substance Prescriptions [place] Controlled Substance Registry consulted? Not Applicable   Zigmund Gottron, NP 01/27/19 1820

## 2019-01-27 NOTE — Discharge Instructions (Signed)
Push fluids to ensure adequate hydration and keep secretions thin.  Throat lozenges, gargles, chloraseptic spray, warm teas, popsicles etc to help with throat pain.   Complete course of antibiotics.  Daily nasal spray.  May need to follow up with ENT if persistent symptoms.

## 2019-02-13 IMAGING — US US THYROID
1 series · 13 of 25 positions shown · non-contrast
Comparison: None.

CLINICAL DATA: Thyroid fullness

EXAM:
THYROID ULTRASOUND
TECHNIQUE: Ultrasound examination of the thyroid gland and adjacent soft
tissues was performed.

[Series 1: us thyroid · 13 of 71 slices shown]
[im 1/71]
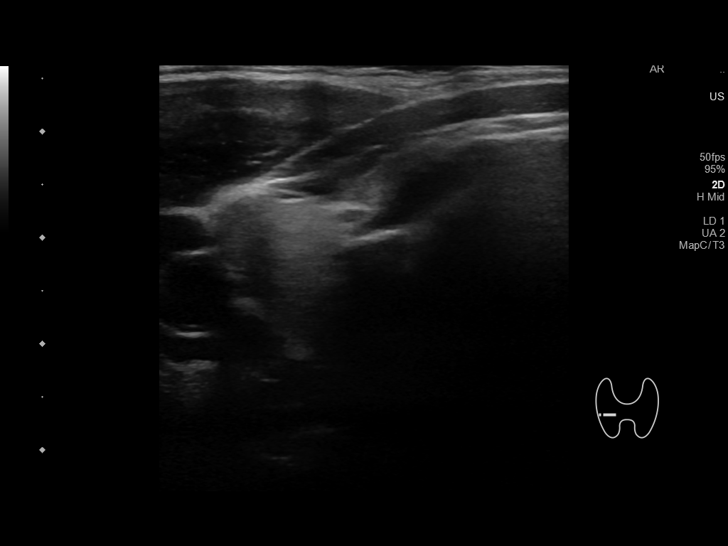
[im 6/71]
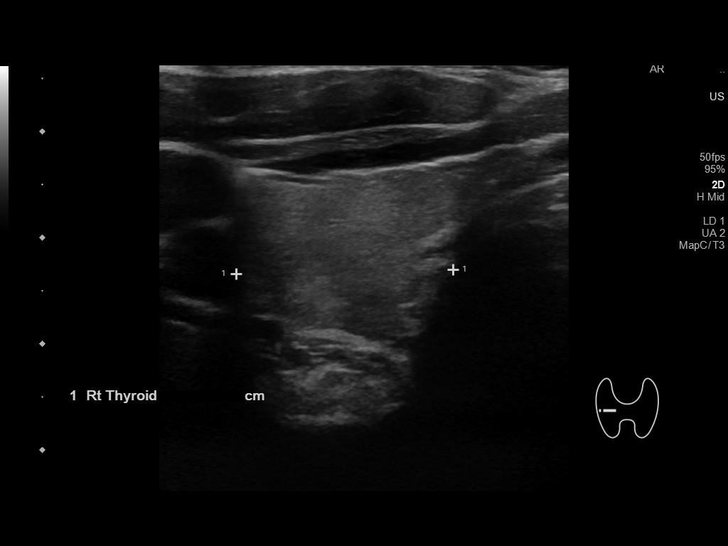
[im 12/71]
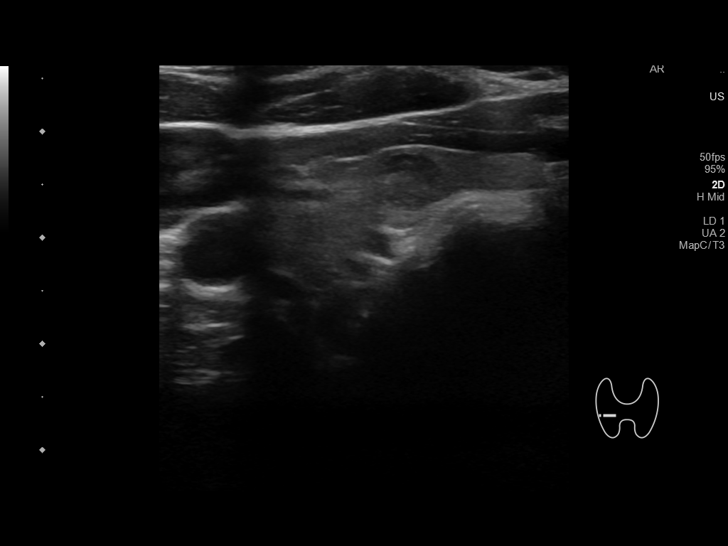
[im 18/71]
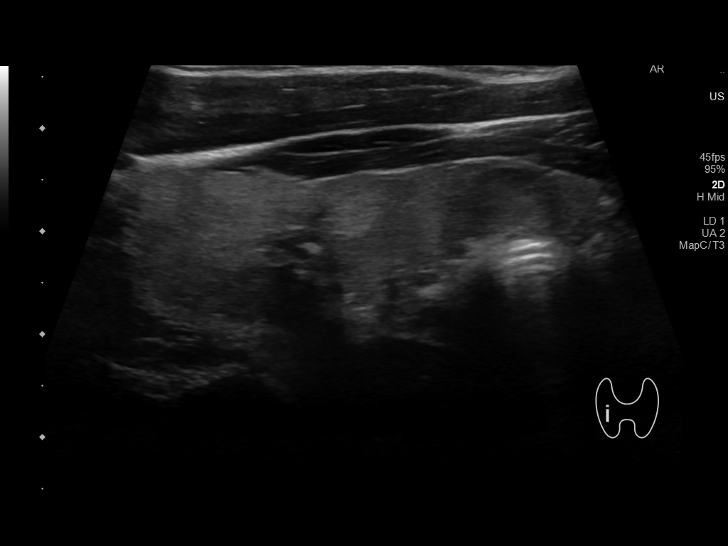
[im 24/71]
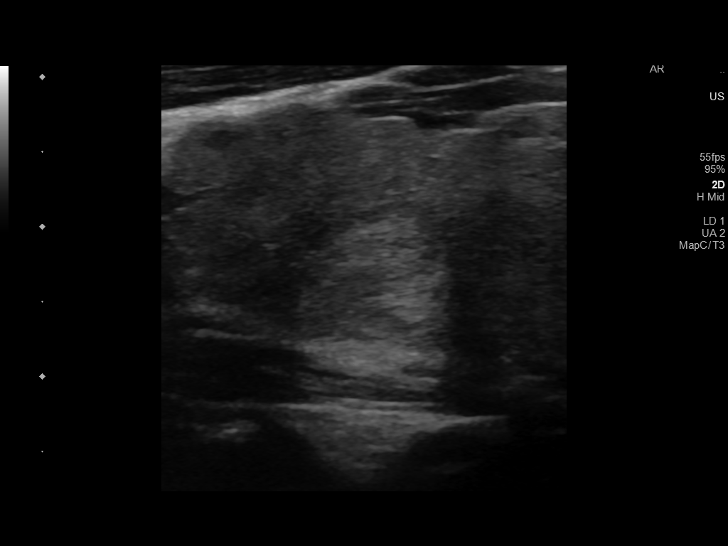
[im 30/71]
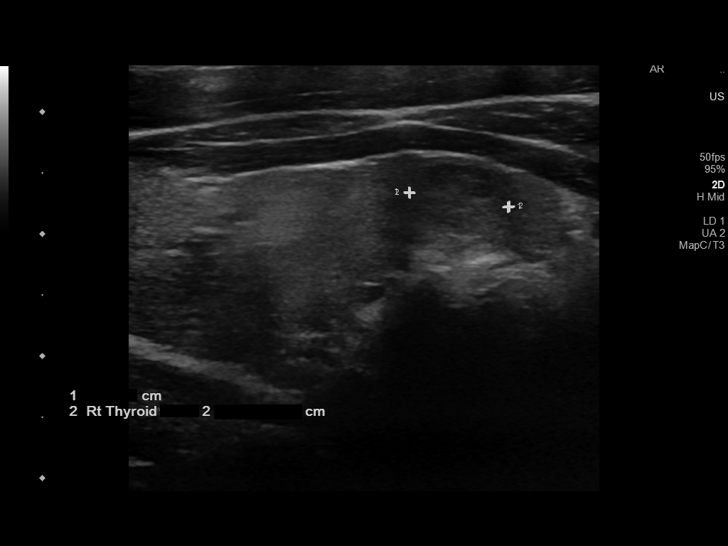
[im 36/71]
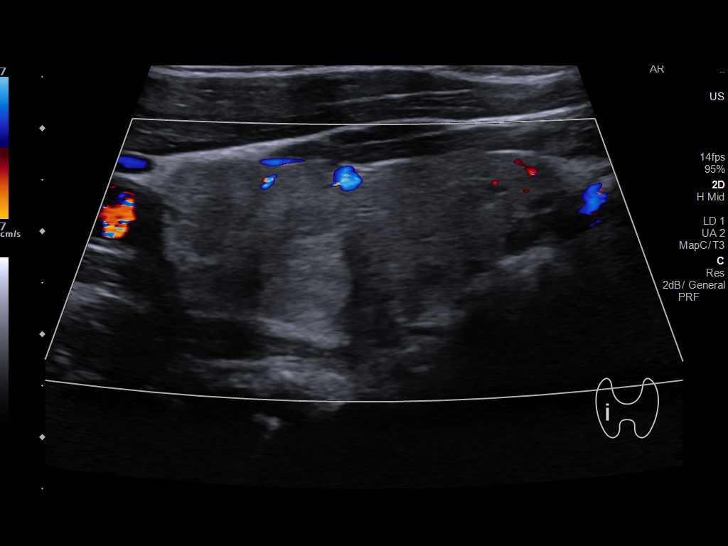
[im 41/71]
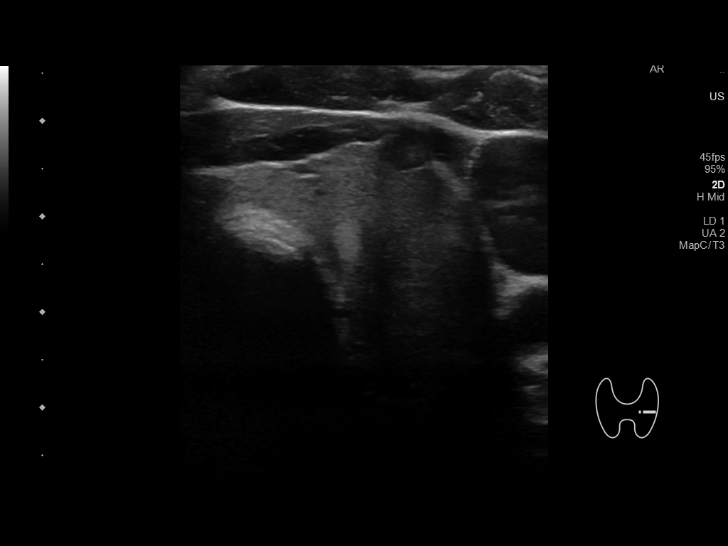
[im 47/71]
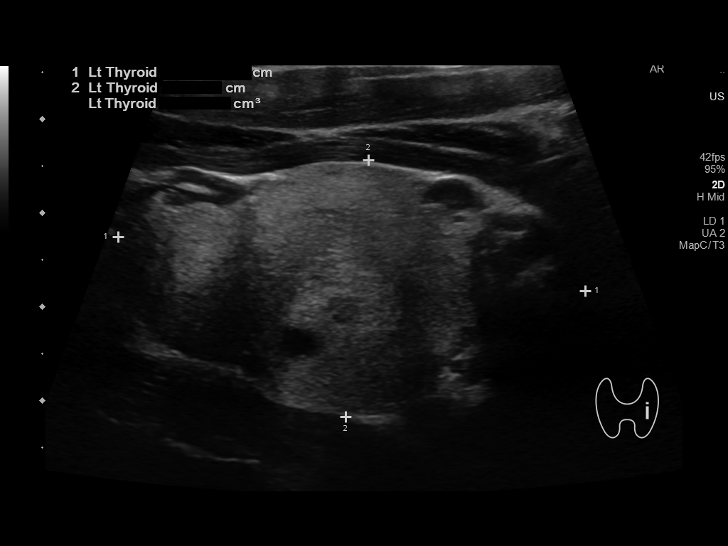
[im 53/71]
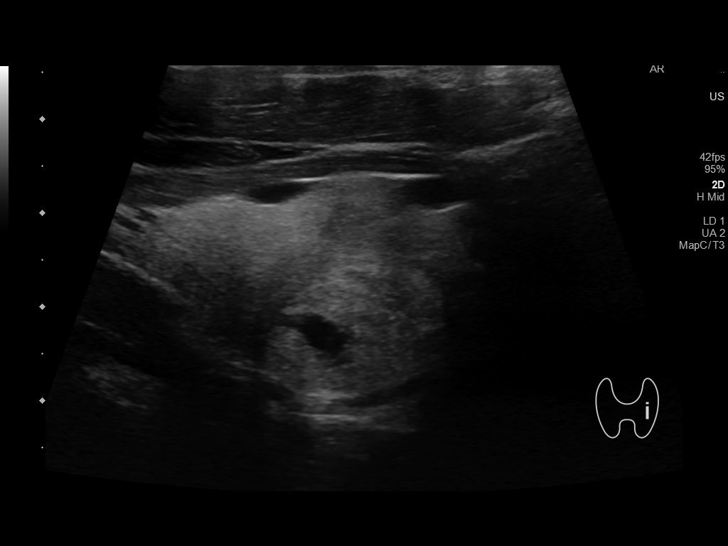
[im 59/71]
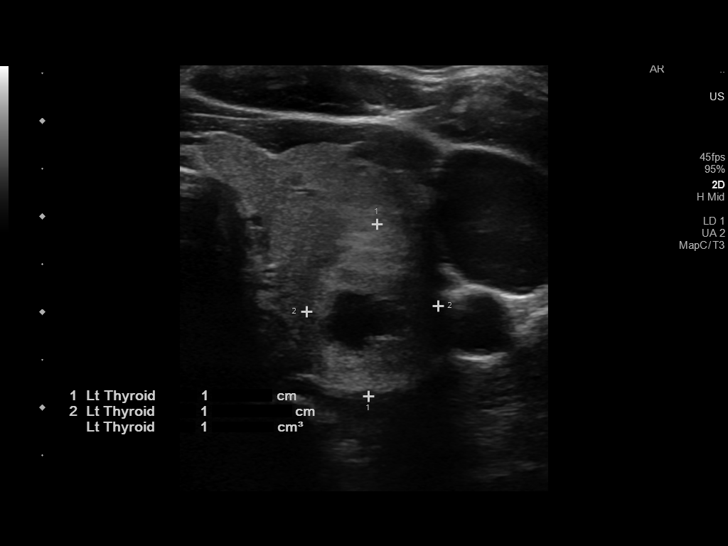
[im 65/71]
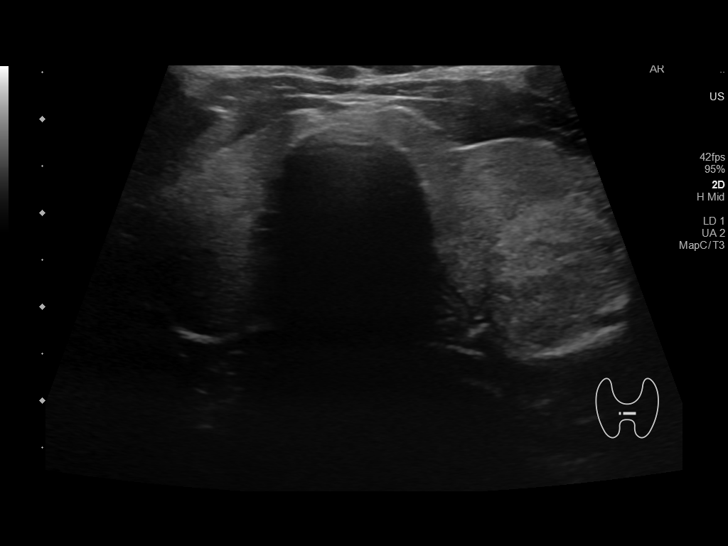
[im 71/71]
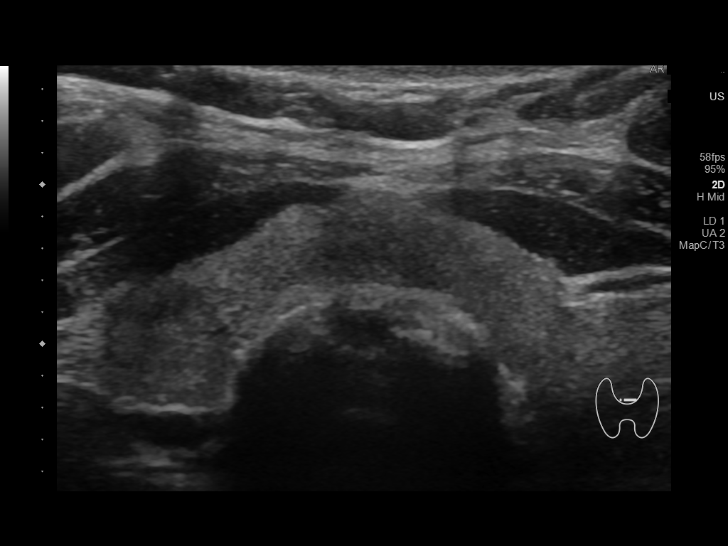

[13 of 25 positions shown; findings below may reference images not displayed]

FINDINGS: Parenchymal Echotexture: Mildly heterogenous

Isthmus: 6 mm

Right lobe: 4.4 x 1.7 x 2.0 cm

Left lobe: 5.0 x 2.8 x 2.0 cm

_________________________________________________________

Estimated total number of nodules >/= 1 cm: 2

Number of spongiform nodules >/=  2 cm not described below (TR1): 0

Number of mixed cystic and solid nodules >/= 1.5 cm not described
below (TR2): 0

_________________________________________________________

Nodule # 1:

Location: Right; Mid

Maximum size: 1.1 cm; Other 2 dimensions: 1.0 x 1.0 cm

Composition: solid/almost completely solid (2)

Echogenicity: hyperechoic (1)

Shape: not taller-than-wide (0)

Margins: ill-defined (0)

Echogenic foci: none (0)

ACR TI-RADS total points: 3.

ACR TI-RADS risk category: TR3 (3 points).

ACR TI-RADS recommendations:

Given size (<1.4 cm) and appearance, this nodule does NOT meet
TI-RADS criteria for biopsy or dedicated follow-up.

_________________________________________________________

Nodule # 3:

Location: Left; Mid

Maximum size: 1.8 cm; Other 2 dimensions: 1.8 x 1.4 cm

Composition: mixed cystic and solid (1)

Echogenicity: hyperechoic (1)

Shape: taller-than-wide (3)

Margins: ill-defined (0)

Echogenic foci: none (0)

ACR TI-RADS total points: 4.

ACR TI-RADS risk category: TR4 (4-6 points).

ACR TI-RADS recommendations:

**Given size (>/= 1.5 cm) and appearance, fine needle aspiration of
this moderately suspicious nodule should be considered based on
TI-RADS criteria.

_________________________________________________________

There is an additional subcentimeter right thyroid midpole
hypoechoic nodule adjacent to the isthmus measuring 8 mm in greatest
dimension. This would not meet criteria for any biopsy or follow-up.

No adenopathy.
IMPRESSION: 1.8 cm left mid thyroid TR 4 nodule meets criteria for biopsy as
above.

1.1 cm right mid thyroid TR 3 nodule does not meet criteria for any
biopsy or follow-up.

The above is in keeping with the ACR TI-RADS recommendations - [HOSPITAL] 5471;[DATE].

## 2019-02-20 ENCOUNTER — Other Ambulatory Visit: Payer: Self-pay | Admitting: Adult Health

## 2019-03-12 ENCOUNTER — Other Ambulatory Visit: Payer: Self-pay

## 2019-03-12 ENCOUNTER — Other Ambulatory Visit: Payer: Self-pay | Admitting: Rheumatology

## 2019-03-12 DIAGNOSIS — Z79899 Other long term (current) drug therapy: Secondary | ICD-10-CM

## 2019-03-12 NOTE — Telephone Encounter (Signed)
Last Visit: 11/21/2018 Next Visit: message sent to the front desk to schedule.  Labs: 11/21/2018 WNL   Attempted to contact patient and left message on machine to advise patient she is due to update labs. Advised patient she will have to go to a main [facility] or [facility].   Okay to refill 30 day supply, per Dr. Estanislado Pandy.

## 2019-03-17 ENCOUNTER — Other Ambulatory Visit: Payer: Self-pay | Admitting: Adult Health

## 2019-03-26 ENCOUNTER — Other Ambulatory Visit: Payer: Self-pay | Admitting: Rheumatology

## 2019-03-26 ENCOUNTER — Other Ambulatory Visit: Payer: Self-pay

## 2019-03-26 MED ORDER — METHOTREXATE 2.5 MG PO TABS: ORAL_TABLET | ORAL | 0 refills | Status: DC

## 2019-03-26 NOTE — Telephone Encounter (Signed)
Last Visit: 11/21/2018 Next Visit: 05/09/2019 Labs: 12/31/2019CBC and CMP WNL  Patient is aware she is due for labs, orders were released.   Okay to refill 30 day supply, per Dr. Estanislado Pandy.

## 2019-04-25 NOTE — Progress Notes (Deleted)
Office Visit Note  Patient: [name]             Date of Birth: [birth year]           MRN: [id number]             PCP: Patient, No Pcp Per Referring: No ref. provider found Visit Date: 05/09/2019 Occupation: @GUAROCC @  Subjective:  No chief complaint on file.   Methotrexate 7 tablets every 7 days and folic acid 1 mg 2 tablets daily.  Most recent CBC/CMP within normal limits on 11/21/2018.  She is overdue for labs.  CBC/CMP ordered for today and will monitor every 3 months.  Standing orders are in place.  Recommend annual flu, Prevnar 13, and Pneumovax 23 vaccines as indicated.  History of Present Illness: [name] is a 48 y.o. female ***   Activities of Daily Living:  Patient reports morning stiffness for *** {minute/hour:19697}.   Patient {ACTIONS;DENIES/REPORTS:21021675::"Denies"} nocturnal pain.  Difficulty dressing/grooming: {ACTIONS;DENIES/REPORTS:21021675::"Denies"} Difficulty climbing stairs: {ACTIONS;DENIES/REPORTS:21021675::"Denies"} Difficulty getting out of chair: {ACTIONS;DENIES/REPORTS:21021675::"Denies"} Difficulty using hands for taps, buttons, cutlery, and/or writing: {ACTIONS;DENIES/REPORTS:21021675::"Denies"}  No Rheumatology ROS completed.   PMFS History:  Patient Active Problem List   Diagnosis Date Noted  . Multiple thyroid nodules 12/13/2018  . IUD (intrauterine device) in place 12/13/2018  . Screening for colorectal cancer 12/13/2018  . Encounter for gynecological examination with Papanicolaou smear of cervix 12/13/2018  . Hair loss 12/13/2018  . Encounter for IUD insertion 01/26/2018  . 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 Well woman exam with routine gynecological exam 07/14/2017  . Multiple acquired skin tags 07/14/2017  . Rheumatoid arthritis with rheumatoid factor of multiple sites without organ or systems involvement ([place]) 11/09/2016  . High risk medication use 11/09/2016  . Non compliance w medication regimen 11/09/2016  .  Constipation 06/16/2016  . Obesity 05/16/2015    Past Medical History:  Diagnosis Date  . Arthritis   . BV (bacterial vaginosis)   . Constipation 06/16/2016  . HSV (herpes simplex virus) infection   . IUD (intrauterine device) in place 03/20/2014   IUD inserted 01/23/13  . Obesity   . Osteoarthritis   . RA (rheumatoid arthritis) ([place])   . Trichomonas     Family History  Problem Relation Age of Onset  . Diabetes Mother   . COPD Mother   . Arthritis Mother   . Hypertension Mother   . Heart disease Mother   . Asthma Son   . Cancer Maternal Grandmother        lung  . Heart disease Paternal Grandmother        CHF   Past Surgical History:  Procedure Laterality Date  . APPENDECTOMY    . CHOLECYSTECTOMY    . HAND SURGERY    . OVARIAN CYST REMOVAL N/A 08/17/2007  . TONSILLECTOMY     Social History   Social History Narrative   Works at a [employer].   Has children.   Does not smoke.   Eats meat fruits and vegetables.   Wears her seatbelt.    There is no immunization history on file for this patient.   Objective: Vital Signs: There were no vitals taken for this visit.   Physical Exam   Musculoskeletal Exam: ***  CDAI Exam: CDAI Score: Not documented Patient Global Assessment: Not documented; Provider Global Assessment: Not documented Swollen: Not documented; Tender: Not documented Joint Exam   Not documented   There is currently no information documented on the homunculus.  Go to the Rheumatology activity and complete the homunculus joint exam.  Investigation: No additional findings.  Imaging: No results found.  Recent Labs: Lab Results  Component Value Date   WBC 8.4 11/21/2018   HGB 14.2 11/21/2018   PLT 297 11/21/2018   NA 136 11/21/2018   K 4.1 11/21/2018   CL 101 11/21/2018   CO2 28 11/21/2018   GLUCOSE 78 11/21/2018   BUN 9 11/21/2018   CREATININE 0.71 11/21/2018   BILITOT 0.5 11/21/2018   ALKPHOS 75 06/17/2017   AST 19 11/21/2018   ALT  24 11/21/2018   PROT 7.4 11/21/2018   ALBUMIN 4.5 06/17/2017   CALCIUM 9.3 11/21/2018   GFRAA 118 11/21/2018    Speciality Comments: No specialty comments available.  Procedures:  No procedures performed Allergies: Contrast media [iodinated diagnostic agents]   Assessment / Plan:     Visit Diagnoses: Rheumatoid arthritis with rheumatoid factor of multiple sites without organ or systems involvement (HCC)  High risk medication use  Chronic SI joint pain  Non compliance w medication regimen   Orders: No orders of the defined types were placed in this encounter.  No orders of the defined types were placed in this encounter.   Face-to-face time spent with patient was *** minutes. Greater than 50% of time was spent in counseling and coordination of care.  Follow-Up Instructions: No follow-ups on file.   Ofilia Neas, PA-C  Note - This record has been created using Dragon software.  Chart creation errors have been sought, but may not always  have been located. Such creation errors do not reflect on  the standard of medical care.

## 2019-05-09 ENCOUNTER — Ambulatory Visit: Payer: Self-pay | Admitting: Physician Assistant

## 2019-05-15 ENCOUNTER — Other Ambulatory Visit: Payer: Self-pay | Admitting: Rheumatology

## 2019-05-15 NOTE — Telephone Encounter (Signed)
Last Visit: 11/21/2018 Next Visit: 06/20/2019  Okay to refill per Dr. Estanislado Pandy

## 2019-06-07 NOTE — Progress Notes (Signed)
Office Visit Note  Patient: [name]             Date of Birth: December 23, 1970           MRN: [id number]             PCP: Patient, No Pcp Per Referring: No ref. provider found Visit Date: 06/20/2019 Occupation: @GUAROCC @  Subjective:  Medication monitoring   History of Present Illness: [name] is a 48 y.o. female with history of seropositive rheumatoid arthritis.  She is taking Methotrexate 7 tablets po once weekly and folic acid 2 mg po daily.  She reports 1 month ago she missed 2 doses of MTX, and she started having pain in both wrist joints but no joint swelling.  She states the wrist pain resolved once she restarted on MTX. She has intermittent left shoulder joint pain.  She occasionally takes diclofenac.  She denies any other joint pain or joint swelling.  She denies any morning stiffness.     Activities of Daily Living:  Patient reports morning stiffness for 0 none.   Patient Denies nocturnal pain.  Difficulty dressing/grooming: Denies Difficulty climbing stairs: Denies Difficulty getting out of chair: Denies Difficulty using hands for taps, buttons, cutlery, and/or writing: Denies  Review of Systems  Constitutional: Positive for fatigue.  HENT: Negative for mouth dryness.   Eyes: Negative for dryness.  Respiratory: Negative for shortness of breath.   Cardiovascular: Positive for swelling in legs/feet.  Gastrointestinal: Positive for constipation.  Endocrine: Negative for excessive thirst.  Genitourinary: Negative for difficulty urinating.  Musculoskeletal: Negative for arthralgias and joint pain.  Skin: Negative for rash.  Allergic/Immunologic: Negative for susceptible to infections.  Neurological: Positive for numbness.  Hematological: Negative for bruising/bleeding tendency.  Psychiatric/Behavioral: Positive for sleep disturbance.    PMFS History:  Patient Active Problem List   Diagnosis Date Noted  . Multiple thyroid nodules 12/13/2018  . IUD  (intrauterine device) in place 12/13/2018  . Screening for colorectal cancer 12/13/2018  . Encounter for gynecological examination with Papanicolaou smear of cervix 12/13/2018  . Hair loss 12/13/2018  . Encounter for IUD insertion 01/26/2018  . 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 Well woman exam with routine gynecological exam 07/14/2017  . Multiple acquired skin tags 07/14/2017  . Rheumatoid arthritis with rheumatoid factor of multiple sites without organ or systems involvement ([place]) 11/09/2016  . High risk medication use 11/09/2016  . Non compliance w medication regimen 11/09/2016  . Constipation 06/16/2016  . Obesity 05/16/2015    Past Medical History:  Diagnosis Date  . Arthritis   . BV (bacterial vaginosis)   . Constipation 06/16/2016  . HSV (herpes simplex virus) infection   . IUD (intrauterine device) in place 03/20/2014   IUD inserted 01/23/13  . Obesity   . Osteoarthritis   . RA (rheumatoid arthritis) ([place] [address])   . Trichomonas     Family History  Problem Relation Age of Onset  . Diabetes Mother   . COPD Mother   . Arthritis Mother   . Hypertension Mother   . Heart disease Mother   . Asthma Son   . Cancer Maternal Grandmother        lung  . Heart disease Paternal Grandmother        CHF   Past Surgical History:  Procedure Laterality Date  . APPENDECTOMY    . CHOLECYSTECTOMY    . HAND SURGERY    . OVARIAN CYST REMOVAL N/A 08/17/2007  . TONSILLECTOMY  Social History   Social History Narrative   Works at a [employer].   Has children.   Does not smoke.   Eats meat fruits and vegetables.   Wears her seatbelt.    There is no immunization history on file for this patient.   Objective: Vital Signs: BP 133/81 (BP Location: Left Arm, Patient Position: Sitting, Cuff Size: Normal)   Pulse 83   Resp 14   Ht 5' 1.5" (1.562 m)   Wt 226 lb 9.6 oz (102.8 kg)   BMI 42.12 kg/m    Physical Exam Vitals signs and nursing note reviewed.  Constitutional:       Appearance: She is well-developed.  HENT:     Head: Normocephalic and atraumatic.  Eyes:     Conjunctiva/sclera: Conjunctivae normal.  Neck:     Musculoskeletal: Normal range of motion.  Cardiovascular:     Rate and Rhythm: Normal rate and regular rhythm.     Heart sounds: Normal heart sounds.  Pulmonary:     Effort: Pulmonary effort is normal.     Breath sounds: Normal breath sounds.  Abdominal:     General: Bowel sounds are normal.     Palpations: Abdomen is soft.  Lymphadenopathy:     Cervical: No cervical adenopathy.  Skin:    General: Skin is warm and dry.     Capillary Refill: Capillary refill takes less than 2 seconds.  Neurological:     Mental Status: She is alert and oriented to person, place, and time.  Psychiatric:        Behavior: Behavior normal.      Musculoskeletal Exam: C-spine, thoracic spine, lumbar spine good range of motion.  No midline spinal tenderness.  No SI joint tenderness.  Shoulder joints, elbows, wrist joints, MCPs, PIPs and DIPs good range of motion no synovitis.  She has complete fist formation bilaterally.  Hip joints, knee joints, ankle joints, MTPs, PIPs and DIPs good range of motion no synovitis.  No warmth or effusion bilateral knee joints.  No tenderness or swelling of ankle joints.  No tenderness over trochanteric bursa bilaterally.  CDAI Exam: CDAI Score: 0.2  Patient Global: 1 mm; Provider Global: 1 mm Swollen: 0 ; Tender: 0  Joint Exam   No joint exam has been documented for this visit   There is currently no information documented on the homunculus. Go to the Rheumatology activity and complete the homunculus joint exam.  Investigation: No additional findings.  Imaging: No results found.  Recent Labs: Lab Results  Component Value Date   WBC 8.4 11/21/2018   HGB 14.2 11/21/2018   PLT 297 11/21/2018   NA 136 11/21/2018   K 4.1 11/21/2018   CL 101 11/21/2018   CO2 28 11/21/2018   GLUCOSE 78 11/21/2018   BUN 9 11/21/2018    CREATININE 0.71 11/21/2018   BILITOT 0.5 11/21/2018   ALKPHOS 75 06/17/2017   AST 19 11/21/2018   ALT 24 11/21/2018   PROT 7.4 11/21/2018   ALBUMIN 4.5 06/17/2017   CALCIUM 9.3 11/21/2018   GFRAA 118 11/21/2018    Speciality Comments: No specialty comments available.  Procedures:  No procedures performed Allergies: Contrast media [iodinated diagnostic agents]   Assessment / Plan:     Visit Diagnoses: Rheumatoid arthritis with rheumatoid factor of multiple sites without organ or systems involvement (HCC) -  +RF, +CCP: She has no synovitis on exam.  She has not had any recent rheumatoid arthritis flares.  She is clinically doing well  on methotrexate 7 tablets by mouth once weekly and folic acid 2 mg by mouth daily.  She reports that 1 month ago she missed 2 doses of methotrexate and developed increased tenderness in bilateral wrist joints.  She states at that time she did not have joint swelling.  She states once resuming methotrexate her discomfort resolved.  She has no joint pain or joint swelling at this time.  She has no morning stiffness.  She will continue taking methotrexate 7 tablets by mouth once weekly and folic acid 2 mg by mouth daily.  Refill methotrexate was sent to the pharmacy today.  We will check a CBC and CMP today.  She was advised to notify us if she develops increased joint pain or joint swelling.  She will follow-up in the office in 5 months  High risk medication use - Methotrexate 7 tablets by mouth once weekly and folic acid 2 mg by mouth daily.  CBC and CMP will be drawn today to monitor for drug toxicity.  She will return in October and every 3 months for lab work.  She was advised to hold methotrexate if she develops any signs or symptoms of an infection and to resume once the infection has completely cleared.  We discussed the importance of social distancing and following the standard precautions recommended by the CDC.- Plan: CBC with Differential/Platelet, COMPLETE  METABOLIC PANEL WITH GFR  Chronic SI joint pain - She has no SI joint tenderness on exam today.  Non compliance w medication regimen   Orders: Orders Placed This Encounter  Procedures  . CBC with Differential/Platelet  . COMPLETE METABOLIC PANEL WITH GFR   Meds ordered this encounter  Medications  . methotrexate (RHEUMATREX) 2.5 MG tablet    Sig: Take 7 tablets by mouth once weekly. Caution:Chemotherapy. Protect from light.    Dispense:  84 tablet    Refill:  0      Follow-Up Instructions: Return in about 5 months (around 11/20/2019) for Rheumatoid arthritis.   Ofilia Neas, PA-C  Note - This record has been created using Dragon software.  Chart creation errors have been sought, but may not always  have been located. Such creation errors do not reflect on  the standard of medical care.

## 2019-06-20 ENCOUNTER — Encounter: Payer: Self-pay | Admitting: Physician Assistant

## 2019-06-20 ENCOUNTER — Ambulatory Visit (INDEPENDENT_AMBULATORY_CARE_PROVIDER_SITE_OTHER): Payer: 59 | Admitting: Physician Assistant

## 2019-06-20 ENCOUNTER — Other Ambulatory Visit: Payer: Self-pay

## 2019-06-20 VITALS — BP 133/81 | HR 83 | Resp 14 | Ht 61.5 in | Wt 226.6 lb

## 2019-06-20 DIAGNOSIS — M533 Sacrococcygeal disorders, not elsewhere classified: Secondary | ICD-10-CM

## 2019-06-20 DIAGNOSIS — G8929 Other chronic pain: Secondary | ICD-10-CM

## 2019-06-20 DIAGNOSIS — M0579 Rheumatoid arthritis with rheumatoid factor of multiple sites without organ or systems involvement: Secondary | ICD-10-CM

## 2019-06-20 DIAGNOSIS — Z79899 Other long term (current) drug therapy: Secondary | ICD-10-CM | POA: Diagnosis not present

## 2019-06-20 MED ORDER — METHOTREXATE 2.5 MG PO TABS: ORAL_TABLET | ORAL | 0 refills | Status: DC

## 2019-06-20 NOTE — Patient Instructions (Signed)
Standing Labs We placed an order today for your standing lab work.    Please come back and get your standing labs in October and every 3 months   We have open lab daily Monday through Thursday from 8:30-12:30 PM and 1:30-4:30 PM and Friday from 8:30-12:30 PM and 1:30 -4:00 PM at the office of Dr. Shaili Deveshwar.   You may experience shorter wait times on Monday and Friday afternoons. The office is located at [address], [address], [place], [place] [address] No appointment is necessary.   Labs are drawn by [facility].  You may receive a bill from [facility] for your lab work.  If you wish to have your labs drawn at another location, please call the office 24 hours in advance to send orders.  If you have any questions regarding directions or hours of operation,  please call [phone].   Just as a reminder please drink plenty of water prior to coming for your lab work. Thanks!   

## 2019-06-21 LAB — CBC WITH DIFFERENTIAL/PLATELET
Absolute Monocytes: 808 cells/uL (ref 200–950)
Basophils Absolute: 95 cells/uL (ref 0–200)
Basophils Relative: 1 %
Eosinophils Absolute: 276 cells/uL (ref 15–500)
Eosinophils Relative: 2.9 %
HCT: 41.8 % (ref 35.0–45.0)
Hemoglobin: 13.9 g/dL (ref 11.7–15.5)
Lymphs Abs: 2290 cells/uL (ref 850–3900)
MCH: 31.1 pg (ref 27.0–33.0)
MCHC: 33.3 g/dL (ref 32.0–36.0)
MCV: 93.5 fL (ref 80.0–100.0)
MPV: 10.5 fL (ref 7.5–12.5)
Monocytes Relative: 8.5 %
Neutro Abs: 6033 cells/uL (ref 1500–7800)
Neutrophils Relative %: 63.5 %
Platelets: 306 10*3/uL (ref 140–400)
RBC: 4.47 10*6/uL (ref 3.80–5.10)
RDW: 13 % (ref 11.0–15.0)
Total Lymphocyte: 24.1 %
WBC: 9.5 10*3/uL (ref 3.8–10.8)

## 2019-06-21 LAB — COMPLETE METABOLIC PANEL WITH GFR
AG Ratio: 1.7 (calc) (ref 1.0–2.5)
ALT: 15 U/L (ref 6–29)
AST: 13 U/L (ref 10–35)
Albumin: 4.3 g/dL (ref 3.6–5.1)
Alkaline phosphatase (APISO): 71 U/L (ref 31–125)
BUN: 9 mg/dL (ref 7–25)
CO2: 29 mmol/L (ref 20–32)
Calcium: 9.6 mg/dL (ref 8.6–10.2)
Chloride: 101 mmol/L (ref 98–110)
Creat: 0.66 mg/dL (ref 0.50–1.10)
GFR, Est African American: 121 mL/min/{1.73_m2} (ref >=60)
GFR, Est Non African American: 104 mL/min/{1.73_m2} (ref >=60)
Globulin: 2.6 g/dL (calc) (ref 1.9–3.7)
Glucose, Bld: 86 mg/dL (ref 65–99)
Potassium: 4.3 mmol/L (ref 3.5–5.3)
Sodium: 136 mmol/L (ref 135–146)
Total Bilirubin: 0.4 mg/dL (ref 0.2–1.2)
Total Protein: 6.9 g/dL (ref 6.1–8.1)

## 2019-06-21 NOTE — Progress Notes (Signed)
Labs are WNL.

## 2019-07-11 ENCOUNTER — Other Ambulatory Visit: Payer: Self-pay | Admitting: Adult Health

## 2019-09-26 ENCOUNTER — Other Ambulatory Visit: Payer: Self-pay | Admitting: Physician Assistant

## 2019-09-26 NOTE — Telephone Encounter (Addendum)
Last Visit: 06/20/19  Next visit: 11/21/19 Labs: 06/20/19 WNL   Patient advised she is due for labs. Patient states she will update this week.   Okay to refill 30 day supply per Dr. Estanislado Pandy

## 2019-09-27 ENCOUNTER — Other Ambulatory Visit: Payer: Self-pay | Admitting: *Deleted

## 2019-09-27 DIAGNOSIS — Z79899 Other long term (current) drug therapy: Secondary | ICD-10-CM

## 2019-10-02 ENCOUNTER — Other Ambulatory Visit: Payer: Self-pay

## 2019-10-02 DIAGNOSIS — Z20822 Contact with and (suspected) exposure to covid-19: Secondary | ICD-10-CM

## 2019-10-03 ENCOUNTER — Telehealth: Payer: Self-pay

## 2019-10-03 LAB — NOVEL CORONAVIRUS, NAA: SARS-CoV-2, NAA: NOT DETECTED

## 2019-10-03 NOTE — Telephone Encounter (Signed)
Patient called in requesting MyChart setup assistance and COVID19 lab results - DOB/Address verified - Negative results given. Assisted with MyChart setup, no further questions.

## 2019-11-19 ENCOUNTER — Telehealth: Payer: Self-pay | Admitting: Rheumatology

## 2019-11-19 ENCOUNTER — Other Ambulatory Visit: Payer: Self-pay

## 2019-11-19 DIAGNOSIS — Z79899 Other long term (current) drug therapy: Secondary | ICD-10-CM

## 2019-11-19 NOTE — Telephone Encounter (Signed)
Lab orders released for [facility].  

## 2019-11-19 NOTE — Telephone Encounter (Signed)
Patient called requesting labwork orders be sent to [facility] in [place].  Patient states she will try to go this afternoon after her dentist appointment or tomorrow.

## 2019-11-21 ENCOUNTER — Ambulatory Visit: Payer: 59 | Admitting: Physician Assistant

## 2019-11-21 LAB — COMPLETE METABOLIC PANEL WITH GFR
AG Ratio: 1.4 (calc) (ref 1.0–2.5)
ALT: 19 U/L (ref 6–29)
AST: 16 U/L (ref 10–35)
Albumin: 4.3 g/dL (ref 3.6–5.1)
Alkaline phosphatase (APISO): 76 U/L (ref 31–125)
BUN: 10 mg/dL (ref 7–25)
CO2: 28 mmol/L (ref 20–32)
Calcium: 9.1 mg/dL (ref 8.6–10.2)
Chloride: 101 mmol/L (ref 98–110)
Creat: 0.77 mg/dL (ref 0.50–1.10)
GFR, Est African American: 106 mL/min/{1.73_m2} (ref >=60)
GFR, Est Non African American: 91 mL/min/{1.73_m2} (ref >=60)
Globulin: 3 g/dL (calc) (ref 1.9–3.7)
Glucose, Bld: 76 mg/dL (ref 65–139)
Potassium: 4.1 mmol/L (ref 3.5–5.3)
Sodium: 136 mmol/L (ref 135–146)
Total Bilirubin: 0.4 mg/dL (ref 0.2–1.2)
Total Protein: 7.3 g/dL (ref 6.1–8.1)

## 2019-11-21 LAB — CBC WITH DIFFERENTIAL/PLATELET
Absolute Monocytes: 846 cells/uL (ref 200–950)
Basophils Absolute: 73 cells/uL (ref 0–200)
Basophils Relative: 0.8 %
Eosinophils Absolute: 246 cells/uL (ref 15–500)
Eosinophils Relative: 2.7 %
HCT: 40.8 % (ref 35.0–45.0)
Hemoglobin: 13.8 g/dL (ref 11.7–15.5)
Lymphs Abs: 2038 cells/uL (ref 850–3900)
MCH: 31.5 pg (ref 27.0–33.0)
MCHC: 33.8 g/dL (ref 32.0–36.0)
MCV: 93.2 fL (ref 80.0–100.0)
MPV: 10.2 fL (ref 7.5–12.5)
Monocytes Relative: 9.3 %
Neutro Abs: 5897 cells/uL (ref 1500–7800)
Neutrophils Relative %: 64.8 %
Platelets: 293 10*3/uL (ref 140–400)
RBC: 4.38 10*6/uL (ref 3.80–5.10)
RDW: 12.6 % (ref 11.0–15.0)
Total Lymphocyte: 22.4 %
WBC: 9.1 10*3/uL (ref 3.8–10.8)

## 2019-11-21 NOTE — Progress Notes (Signed)
CBC and CMP are within normal limits.

## 2019-11-28 ENCOUNTER — Other Ambulatory Visit: Payer: Self-pay

## 2019-11-28 ENCOUNTER — Ambulatory Visit (HOSPITAL_COMMUNITY)
Admission: EM | Admit: 2019-11-28 | Discharge: 2019-11-28 | Disposition: A | Discharge status: Home / Self Care | Payer: No Typology Code available for payment source | Attending: Family Medicine | Admitting: Family Medicine

## 2019-11-28 ENCOUNTER — Encounter (HOSPITAL_COMMUNITY): Payer: Self-pay

## 2019-11-28 DIAGNOSIS — R42 Dizziness and giddiness: Secondary | ICD-10-CM

## 2019-11-28 DIAGNOSIS — J0100 Acute maxillary sinusitis, unspecified: Secondary | ICD-10-CM

## 2019-11-28 MED ORDER — AMOXICILLIN-POT CLAVULANATE 875-125 MG PO TABS: 1.0000 | ORAL_TABLET | Freq: Two times a day (BID) | ORAL | 0 refills | Status: DC

## 2019-11-28 MED ORDER — MECLIZINE HCL 25 MG PO TABS: 25.0000 mg | ORAL_TABLET | Freq: Three times a day (TID) | ORAL | 0 refills | Status: DC | PRN

## 2019-11-28 MED ORDER — PSEUDOEPH-BROMPHEN-DM 30-2-10 MG/5ML PO SYRP: 5.0000 mL | ORAL_SOLUTION | Freq: Four times a day (QID) | ORAL | 0 refills | Status: DC | PRN

## 2019-11-28 NOTE — ED Triage Notes (Signed)
Pt states she has a sinus infection for  2 weeks or more. Pt states she has been dizzy as well.

## 2019-11-29 NOTE — ED Provider Notes (Signed)
[place]   [id number] 11/28/19 Arrival Time: [facility]:  1. Dizziness   2. Acute non-recurrent maxillary sinusitis     Will tx for sinusitis. Declines COVID testing. Normal neurologic exam. No suspicion for ICH or SAH. No indication for neurodiagnostic imaging at this time. Discussed.  Begin: Meds ordered this encounter  Medications  . amoxicillin-clavulanate (AUGMENTIN) 875-125 MG tablet    Sig: Take 1 tablet by mouth every 12 (twelve) hours.    Dispense:  20 tablet    Refill:  0  . meclizine (ANTIVERT) 25 MG tablet    Sig: Take 1 tablet (25 mg total) by mouth 3 (three) times daily as needed for dizziness.    Dispense:  20 tablet    Refill:  0  . brompheniramine-pseudoephedrine-DM 30-2-10 MG/5ML syrup    Sig: Take 5 mLs by mouth 4 (four) times daily as needed.    Dispense:  120 mL    Refill:  0    Reviewed expectations re: course of current medical issues. Questions answered. Outlined signs and symptoms indicating need for more acute intervention. Patient verbalized understanding. After Visit Summary given.   SUBJECTIVE:  [name] is a 49 y.o. female who reports gradual onset of intermittent feelings of dizziness described as lightheadedness. Current symptoms first noted 2 w ago and have waxed and waned. Mil d congestion. Over the past several days with increasing and significant sinus pressure/pain. Afebrile. Reports h/o sinus problems and this feels like a sinus infection. No resp symptoms. Normal PO intake without n/v/d. Normal appetite. No specific aggravating or alleviating factors reported. No associated SOB, CP, or palpatations reported. Recent travel: none. Reports normal bowel/bladder habits. Therapies tried thus far: none reported.  Social History   Substance and Sexual Activity  Alcohol Use Not Currently   Social History   Tobacco Use  Smoking Status Never Smoker  Smokeless Tobacco Never Used   Denies illegal drug  use.  ROS: As per HPI. All other systems negative.    OBJECTIVE:  Vitals:   11/28/19 1950 11/28/19 1954  BP:  137/85  Pulse:  88  Resp:  18  Temp:  98.1 F (36.7 C)  TempSrc:  Oral  SpO2:  98%  Weight: 104.3 kg     General appearance: alert; no distress Eyes: PERRLA; EOMI; conjunctiva normal HENT: normocephalic; atraumatic; bilateral maxillary sinus pain reported with palpation Neck: supple with FROM Lungs: clear to auscultation bilaterally Heart: regular rate and rhythm Extremities: no edema; symmetrical with no gross deformities Skin: warm and dry Neurologic: normal gait; DTR's normal and symmetric; CN 2-12 grossly intact Psychological: alert and cooperative; normal mood and affect  Labs reviewed today: Results for orders placed or performed in visit on 11/19/19  COMPLETE METABOLIC PANEL WITH GFR  Result Value Ref Range   Glucose, Bld 76 65 - 139 mg/dL   BUN 10 7 - 25 mg/dL   Creat 0.77 0.50 - 1.10 mg/dL   GFR, Est Non African American 91 > OR = 60 mL/min/1.2m2   GFR, Est African American 106 > OR = 60 mL/min/1.55m2   BUN/Creatinine Ratio NOT APPLICABLE 6 - 22 (calc)   Sodium 136 135 - 146 mmol/L   Potassium 4.1 3.5 - 5.3 mmol/L   Chloride 101 98 - 110 mmol/L   CO2 28 20 - 32 mmol/L   Calcium 9.1 8.6 - 10.2 mg/dL   Total Protein 7.3 6.1 - 8.1 g/dL   Albumin 4.3 3.6 - 5.1 g/dL  Globulin 3.0 1.9 - 3.7 g/dL (calc)   AG Ratio 1.4 1.0 - 2.5 (calc)   Total Bilirubin 0.4 0.2 - 1.2 mg/dL   Alkaline phosphatase (APISO) 76 31 - 125 U/L   AST 16 10 - 35 U/L   ALT 19 6 - 29 U/L  CBC with Differential/Platelet  Result Value Ref Range   WBC 9.1 3.8 - 10.8 Thousand/uL   RBC 4.38 3.80 - 5.10 Million/uL   Hemoglobin 13.8 11.7 - 15.5 g/dL   HCT 40.8 35.0 - 45.0 %   MCV 93.2 80.0 - 100.0 fL   MCH 31.5 27.0 - 33.0 pg   MCHC 33.8 32.0 - 36.0 g/dL   RDW 12.6 11.0 - 15.0 %   Platelets 293 140 - 400 Thousand/uL   MPV 10.2 7.5 - 12.5 fL   Neutro Abs 5,897 1,500 - 7,800  cells/uL   Lymphs Abs 2,038 850 - 3,900 cells/uL   Absolute Monocytes 846 200 - 950 cells/uL   Eosinophils Absolute 246 15 - 500 cells/uL   Basophils Absolute 73 0 - 200 cells/uL   Neutrophils Relative % 64.8 %   Total Lymphocyte 22.4 %   Monocytes Relative 9.3 %   Eosinophils Relative 2.7 %   Basophils Relative 0.8 %     Allergies  Allergen Reactions  . Contrast Media [Iodinated Diagnostic Agents] Swelling    Swelling of the eyes    Past Medical History:  Diagnosis Date  . Arthritis   . BV (bacterial vaginosis)   . Constipation 06/16/2016  . HSV (herpes simplex virus) infection   . IUD (intrauterine device) in place 03/20/2014   IUD inserted 01/23/13  . Obesity   . Osteoarthritis   . RA (rheumatoid arthritis) ([place])   . Trichomonas    Social History   Socioeconomic History  . Marital status: Single    Spouse name: Not on file  . Number of children: Not on file  . Years of education: Not on file  . Highest education level: Not on file  Occupational History  . Not on file  Tobacco Use  . Smoking status: Never Smoker  . Smokeless tobacco: Never Used  Substance and Sexual Activity  . Alcohol use: Not Currently  . Drug use: No  . Sexual activity: Not on file  Other Topics Concern  . Not on file  Social History Narrative   Works at a Health [employer].   Has children.   Does not smoke.   Eats meat fruits and vegetables.   Wears her seatbelt.   Social Determinants of Health   Financial Resource Strain:   . Difficulty of Paying Living Expenses: Not on file  Food Insecurity:   . Worried About Charity fundraiser in the Last Year: Not on file  . Ran Out of Food in the Last Year: Not on file  Transportation Needs:   . Lack of Transportation (Medical): Not on file  . Lack of Transportation (Non-Medical): Not on file  Physical Activity:   . Days of Exercise per Week: Not on file  . Minutes of Exercise per Session: Not on file  Stress:   . Feeling of Stress : Not on  file  Social Connections:   . Frequency of Communication with Friends and Family: Not on file  . Frequency of Social Gatherings with Friends and Family: Not on file  . Attends Religious Services: Not on file  . Active Member of Clubs or Organizations: Not on file  . Attends Club  or Organization Meetings: Not on file  . Marital Status: Not on file  Intimate Partner Violence:   . Fear of Current or Ex-Partner: Not on file  . Emotionally Abused: Not on file  . Physically Abused: Not on file  . Sexually Abused: Not on file   Family History  Problem Relation Age of Onset  . Diabetes Mother   . COPD Mother   . Arthritis Mother   . Hypertension Mother   . Heart disease Mother   . Asthma Son   . Cancer Maternal Grandmother        lung  . Heart disease Paternal Grandmother        CHF   Past Surgical History:  Procedure Laterality Date  . APPENDECTOMY    . CHOLECYSTECTOMY    . HAND SURGERY    . OVARIAN CYST REMOVAL N/A 08/17/2007  . Evonnie Dawes, MD 11/29/19 [phone]

## 2019-11-30 ENCOUNTER — Ambulatory Visit: Payer: 59 | Admitting: Rheumatology

## 2019-12-12 ENCOUNTER — Other Ambulatory Visit: Payer: Self-pay

## 2019-12-12 ENCOUNTER — Ambulatory Visit: Payer: No Typology Code available for payment source | Attending: Internal Medicine

## 2019-12-12 DIAGNOSIS — Z20822 Contact with and (suspected) exposure to covid-19: Secondary | ICD-10-CM

## 2019-12-13 LAB — NOVEL CORONAVIRUS, NAA: SARS-CoV-2, NAA: NOT DETECTED

## 2019-12-31 NOTE — Progress Notes (Signed)
Office Visit Note  Patient: [name]             Date of Birth: 06/01/1971           MRN: [id number]             PCP: Patient, No Pcp Per Referring: No ref. provider found Visit Date: 01/02/2020 Occupation: @GUAROCC @  Subjective:  Left 5th finger pain   History of Present Illness: [name] is a 49 y.o. female with history of seropositive rheumatoid arthritis.  Patient is taking methotrexate 7 tablets by mouth once weekly and folic acid 2 mg by mouth daily.  She denies any recent rheumatoid arthritis flares.  She states that she is having pain and inflammation in the left fourth DIP joint which has been going on for several months.  She is also noticed some crepitus in the left great toe.  She denies any pain in her left great toe at this time.  She continues to experience morning stiffness in her lower back and occasional SI joint pain bilaterally.  She denies any other joint pain or joint swelling at this time.   Activities of Daily Living:  Patient reports morning stiffness for 5-10 minutes.   Patient Denies nocturnal pain.  Difficulty dressing/grooming: Denies Difficulty climbing stairs: Denies Difficulty getting out of chair: Denies Difficulty using hands for taps, buttons, cutlery, and/or writing: Denies  Review of Systems  Constitutional: Negative for fatigue.  HENT: Negative for mouth sores, mouth dryness and nose dryness.   Eyes: Negative for itching and dryness.  Respiratory: Negative for shortness of breath, wheezing and difficulty breathing.   Cardiovascular: Negative for chest pain and palpitations.  Gastrointestinal: Negative for blood in stool, constipation and diarrhea.  Endocrine: Negative for increased urination.  Genitourinary: Negative for difficulty urinating and painful urination.  Musculoskeletal: Positive for arthralgias, joint pain, joint swelling and morning stiffness.  Skin: Negative for rash.  Allergic/Immunologic: Negative for  susceptible to infections.  Neurological: Negative for dizziness, numbness, headaches, memory loss and weakness.  Hematological: Negative for bruising/bleeding tendency.  Psychiatric/Behavioral: Negative for confusion.    PMFS History:  Patient Active Problem List   Diagnosis Date Noted  . Multiple thyroid nodules 12/13/2018  . IUD (intrauterine device) in place 12/13/2018  . Screening for colorectal cancer 12/13/2018  . Encounter for gynecological examination with Papanicolaou smear of cervix 12/13/2018  . Hair loss 12/13/2018  . Encounter for IUD insertion 01/26/2018  . 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 Well woman exam with routine gynecological exam 07/14/2017  . Multiple acquired skin tags 07/14/2017  . Rheumatoid arthritis with rheumatoid factor of multiple sites without organ or systems involvement ([place]) 11/09/2016  . High risk medication use 11/09/2016  . Non compliance w medication regimen 11/09/2016  . Constipation 06/16/2016  . Obesity 05/16/2015    Past Medical History:  Diagnosis Date  . Arthritis   . BV (bacterial vaginosis)   . Constipation 06/16/2016  . HSV (herpes simplex virus) infection   . IUD (intrauterine device) in place 03/20/2014   IUD inserted 01/23/13  . Obesity   . Osteoarthritis   . RA (rheumatoid arthritis) ([place])   . Trichomonas     Family History  Problem Relation Age of Onset  . Diabetes Mother   . COPD Mother   . Arthritis Mother   . Hypertension Mother   . Heart disease Mother   . Asthma Son   . Cancer Maternal Grandmother  lung  . Heart disease Paternal Grandmother        CHF   Past Surgical History:  Procedure Laterality Date  . APPENDECTOMY    . CHOLECYSTECTOMY    . HAND SURGERY    . OVARIAN CYST REMOVAL N/A 08/17/2007  . TONSILLECTOMY     Social History   Social History Narrative   Works at a [employer].   Has children.   Does not smoke.   Eats meat fruits and vegetables.   Wears her seatbelt.    There is  no immunization history on file for this patient.   Objective: Vital Signs: BP (!) 141/86 (BP Location: Left Arm, Patient Position: Sitting, Cuff Size: Large)   Pulse 81   Resp 14   Ht 5\' 1"  (1.549 m)   Wt 238 lb (108 kg)   BMI 44.97 kg/m    Physical Exam Vitals and nursing note reviewed.  Constitutional:      Appearance: She is well-developed.  HENT:     Head: Normocephalic and atraumatic.  Eyes:     Conjunctiva/sclera: Conjunctivae normal.  Cardiovascular:     Rate and Rhythm: Normal rate and regular rhythm.     Heart sounds: Normal heart sounds.  Pulmonary:     Effort: Pulmonary effort is normal.     Breath sounds: Normal breath sounds.  Abdominal:     General: Bowel sounds are normal.     Palpations: Abdomen is soft.  Musculoskeletal:     Cervical back: Normal range of motion.  Lymphadenopathy:     Cervical: No cervical adenopathy.  Skin:    General: Skin is warm and dry.     Capillary Refill: Capillary refill takes less than 2 seconds.  Neurological:     Mental Status: She is alert and oriented to person, place, and time.  Psychiatric:        Behavior: Behavior normal.      Musculoskeletal Exam: C-spine, thoracic spine, spine good range of motion.  No midline spinal tenderness.  She has tenderness of left SI joint.  Shoulder pain, elbows, wrist joints, MCPs, PIPs, and DIPs good ROM with no synovitis.  She has tenderness and inflammation of the left  4th DIP joint.  Synovial thickening of bilateral 1st and 4th DIP joints.  Hip joints, knee joints and ankle joints of MTPs and PIPs and DIPs good range of motion no synovitis.  No warmth or effusion of bilateral knee joints.  No tenderness or swelling of ankle joints.  No tenderness of MTP or PIP joints in her feet.  CDAI Exam: CDAI Score: 0.2  Patient Global: 1 mm; Provider Global: 1 mm Swollen: 1 ; Tender: 1  Joint Exam 01/02/2020      Right  Left  DIP 5     Swollen Tender     Investigation: No additional  findings.  Imaging: No results found.  Recent Labs: Lab Results  Component Value Date   WBC 9.1 11/20/2019   HGB 13.8 11/20/2019   PLT 293 11/20/2019   NA 136 11/20/2019   K 4.1 11/20/2019   CL 101 11/20/2019   CO2 28 11/20/2019   GLUCOSE 76 11/20/2019   BUN 10 11/20/2019   CREATININE 0.77 11/20/2019   BILITOT 0.4 11/20/2019   ALKPHOS 75 06/17/2017   AST 16 11/20/2019   ALT 19 11/20/2019   PROT 7.3 11/20/2019   ALBUMIN 4.5 06/17/2017   CALCIUM 9.1 11/20/2019   GFRAA 106 11/20/2019    Speciality Comments: No  specialty comments available.  Procedures:  No procedures performed Allergies: Contrast media [iodinated diagnostic agents]   Assessment / Plan:     Visit Diagnoses: Rheumatoid arthritis with rheumatoid factor of multiple sites without organ or systems involvement (HCC) - +RF, +CCP: She has no synovitis on exam.  She has not had any recent rheumatoid arthritis flares.  She is clinically doing well on methotrexate 7 tablets by mouth once weekly and folic acid 2 mg by mouth daily.  She is currently having tenderness and inflammation in the left fourth DIP joint.  We discussed using Voltaren gel topically as needed for pain relief.  She has no other joint pain or joint swelling at this time.  She will continue taking methotrexate and folic acid as prescribed.  She does not need any refills at this time.  She was advised to notify us if she develops increased joint pain or joint swelling.  She will follow-up in the office in 5 months.  High risk medication use - Methotrexate 7 tablets by mouth once weekly and folic acid 2 mg by mouth daily.  CBC and CMP are within normal limits on 11/20/2019.  She will return for lab work in March and every 3 months.  Standing orders are in place.  We discussed the importance of holding methotrexate if she develops any signs or symptoms of an infection and to resume once the infection has completely cleared.   Chronic SI joint pain: She has  tenderness over the left SI joint on exam.  She experiences stiffness and discomfort first thing in the morning.  We discussed the importance of performing stretching exercises on a regular basis.  Orders: No orders of the defined types were placed in this encounter.  Meds ordered this encounter  Medications  . diclofenac Sodium (VOLTAREN) 1 % GEL    Sig: Apply 2-4 grams to affected joint 4 times daily as needed.    Dispense:  400 g    Refill:  2      Follow-Up Instructions: Return in about 5 months (around 05/31/2020) for Rheumatoid arthritis.   Ofilia Neas, PA-C  Note - This record has been created using Dragon software.  Chart creation errors have been sought, but may not always  have been located. Such creation errors do not reflect on  the standard of medical care.

## 2020-01-02 ENCOUNTER — Ambulatory Visit (INDEPENDENT_AMBULATORY_CARE_PROVIDER_SITE_OTHER): Payer: No Typology Code available for payment source | Admitting: Physician Assistant

## 2020-01-02 ENCOUNTER — Encounter: Payer: Self-pay | Admitting: Physician Assistant

## 2020-01-02 ENCOUNTER — Other Ambulatory Visit: Payer: Self-pay

## 2020-01-02 VITALS — BP 141/86 | HR 81 | Resp 14 | Ht 61.0 in | Wt 238.0 lb

## 2020-01-02 DIAGNOSIS — Z79899 Other long term (current) drug therapy: Secondary | ICD-10-CM | POA: Diagnosis not present

## 2020-01-02 DIAGNOSIS — M0579 Rheumatoid arthritis with rheumatoid factor of multiple sites without organ or systems involvement: Secondary | ICD-10-CM

## 2020-01-02 DIAGNOSIS — M533 Sacrococcygeal disorders, not elsewhere classified: Secondary | ICD-10-CM

## 2020-01-02 DIAGNOSIS — G8929 Other chronic pain: Secondary | ICD-10-CM

## 2020-01-02 MED ORDER — DICLOFENAC SODIUM 1 % EX GEL: CUTANEOUS | 2 refills | Status: DC

## 2020-01-02 NOTE — Patient Instructions (Signed)
Standing Labs We placed an order today for your standing lab work.    Please come back and get your standing labs at end of March and every 3 months    We have open lab daily Monday through Thursday from 8:30-12:30 PM and 1:30-4:30 PM and Friday from 8:30-12:30 PM and 1:30-4:00 PM at the office of Dr. Bo Merino.   You may experience shorter wait times on Monday and Friday afternoons. The office is located at [address] [place], [place], [place] [address] No appointment is necessary.   Labs are drawn by [facility].  You may receive a bill from [facility] for your lab work.  If you wish to have your labs drawn at another location, please call the office 24 hours in advance to send orders.  If you have any questions regarding directions or hours of operation,  please call [phone].   Just as a reminder please drink plenty of water prior to coming for your lab work. Thanks!

## 2020-01-03 ENCOUNTER — Ambulatory Visit: Payer: No Typology Code available for payment source | Admitting: Physician Assistant

## 2020-01-21 ENCOUNTER — Other Ambulatory Visit: Payer: Self-pay

## 2020-01-21 ENCOUNTER — Telehealth: Payer: Self-pay | Admitting: Rheumatology

## 2020-01-21 ENCOUNTER — Ambulatory Visit (INDEPENDENT_AMBULATORY_CARE_PROVIDER_SITE_OTHER): Payer: 59 | Admitting: Rheumatology

## 2020-01-21 ENCOUNTER — Ambulatory Visit: Payer: Self-pay

## 2020-01-21 ENCOUNTER — Encounter: Payer: Self-pay | Admitting: Rheumatology

## 2020-01-21 VITALS — BP 141/92 | HR 85 | Resp 14 | Ht 62.0 in | Wt 237.0 lb

## 2020-01-21 DIAGNOSIS — Z79899 Other long term (current) drug therapy: Secondary | ICD-10-CM | POA: Diagnosis not present

## 2020-01-21 DIAGNOSIS — M533 Sacrococcygeal disorders, not elsewhere classified: Secondary | ICD-10-CM | POA: Diagnosis not present

## 2020-01-21 DIAGNOSIS — G8929 Other chronic pain: Secondary | ICD-10-CM | POA: Diagnosis not present

## 2020-01-21 DIAGNOSIS — M0579 Rheumatoid arthritis with rheumatoid factor of multiple sites without organ or systems involvement: Secondary | ICD-10-CM

## 2020-01-21 DIAGNOSIS — M25511 Pain in right shoulder: Secondary | ICD-10-CM

## 2020-01-21 MED ORDER — LIDOCAINE HCL 1 % IJ SOLN
1.5000 mL | INTRAMUSCULAR | Status: AC | PRN
  Administered 2020-01-21: 1.5 mL

## 2020-01-21 MED ORDER — TRIAMCINOLONE ACETONIDE 40 MG/ML IJ SUSP
40.0000 mg | INTRAMUSCULAR | Status: AC | PRN
  Administered 2020-01-21: 40 mg via INTRA_ARTICULAR

## 2020-01-21 NOTE — Progress Notes (Signed)
Office Visit Note  Patient: [name]             Date of Birth: 02-20-1971           MRN: [id number]             PCP: Patient, No Pcp Per Referring: No ref. provider found Visit Date: 01/21/2020 Occupation: @GUAROCC @  Subjective:  Right shoulder pain  History of Present Illness: [name] is a 49 y.o. female with history of seropositive rheumatoid arthritis.  She is taking methotrexate 7 tablets by mouth once weekly and folic acid 2 mg by mouth daily.  She denies missing any doses recently. She denies any recent infections.   She presents today with right shoulder joint pain, which started yesterday afternoon.  She denies any overuse or overhead activities.  Denies injuries.  She states the pain was severe in the night.  Her discomfort is most severe with ROM.  She tried using voltaren gel topically and tried taking oral diclofenac as well. She has pain in the left 5th digit but denies any joint swelling. She has no other joint pain or joint swelling.     Activities of Daily Living:  Patient reports morning stiffness for 0 minutes.   Patient Reports nocturnal pain.  Difficulty dressing/grooming: Reports Difficulty climbing stairs: Denies Difficulty getting out of chair: Denies Difficulty using hands for taps, buttons, cutlery, and/or writing: Denies  Review of Systems  Constitutional: Negative for fatigue.  HENT: Negative for mouth sores, mouth dryness and nose dryness.   Eyes: Negative for pain, itching, visual disturbance and dryness.  Respiratory: Negative for cough, hemoptysis, shortness of breath and difficulty breathing.   Cardiovascular: Negative for chest pain, palpitations, hypertension and swelling in legs/feet.  Gastrointestinal: Negative for blood in stool, constipation and diarrhea.  Endocrine: Negative for increased urination.  Genitourinary: Negative for difficulty urinating and painful urination.  Musculoskeletal: Positive for arthralgias, joint  pain and joint swelling. Negative for myalgias, muscle weakness, morning stiffness, muscle tenderness and myalgias.  Skin: Negative for color change, pallor, rash, hair loss, nodules/bumps, skin tightness, ulcers and sensitivity to sunlight.  Allergic/Immunologic: Negative for susceptible to infections.  Neurological: Negative for dizziness, numbness, headaches, memory loss and weakness.  Hematological: Negative for bruising/bleeding tendency and swollen glands.  Psychiatric/Behavioral: Negative for depressed mood, confusion and sleep disturbance. The patient is not nervous/anxious.     PMFS History:  Patient Active Problem List   Diagnosis Date Noted  . Multiple thyroid nodules 12/13/2018  . IUD (intrauterine device) in place 12/13/2018  . Screening for colorectal cancer 12/13/2018  . Encounter for gynecological examination with Papanicolaou smear of cervix 12/13/2018  . Hair loss 12/13/2018  . Encounter for IUD insertion 01/26/2018  . 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 Well woman exam with routine gynecological exam 07/14/2017  . Multiple acquired skin tags 07/14/2017  . Rheumatoid arthritis with rheumatoid factor of multiple sites without organ or systems involvement ([place]) 11/09/2016  . High risk medication use 11/09/2016  . Non compliance w medication regimen 11/09/2016  . Constipation 06/16/2016  . Obesity 05/16/2015    Past Medical History:  Diagnosis Date  . Arthritis   . BV (bacterial vaginosis)   . Constipation 06/16/2016  . HSV (herpes simplex virus) infection   . IUD (intrauterine device) in place 03/20/2014   IUD inserted 01/23/13  . Obesity   . Osteoarthritis   . RA (rheumatoid arthritis) ([place])   . Trichomonas     Family  History  Problem Relation Age of Onset  . Diabetes Mother   . COPD Mother   . Arthritis Mother   . Hypertension Mother   . Heart disease Mother   . Asthma Son   . Cancer Maternal Grandmother        lung  . Heart disease Paternal Grandmother         CHF   Past Surgical History:  Procedure Laterality Date  . APPENDECTOMY    . CHOLECYSTECTOMY    . HAND SURGERY    . OVARIAN CYST REMOVAL N/A 08/17/2007  . TONSILLECTOMY     Social History   Social History Narrative   Works at a [employer].   Has children.   Does not smoke.   Eats meat fruits and vegetables.   Wears her seatbelt.    There is no immunization history on file for this patient.   Objective: Vital Signs: BP (!) 141/92 (BP Location: Left Arm, Patient Position: Sitting, Cuff Size: Normal)   Pulse 85   Resp 14   Ht 5\' 2"  (1.575 m)   Wt 237 lb (107.5 kg)   BMI 43.35 kg/m    Physical Exam Vitals and nursing note reviewed.  Constitutional:      Appearance: She is well-developed.  HENT:     Head: Normocephalic and atraumatic.  Eyes:     Conjunctiva/sclera: Conjunctivae normal.  Cardiovascular:     Rate and Rhythm: Normal rate and regular rhythm.     Heart sounds: Normal heart sounds.  Pulmonary:     Effort: Pulmonary effort is normal.     Breath sounds: Normal breath sounds.  Abdominal:     General: Bowel sounds are normal.     Palpations: Abdomen is soft.  Musculoskeletal:     Cervical back: Normal range of motion.  Lymphadenopathy:     Cervical: No cervical adenopathy.  Skin:    General: Skin is warm and dry.     Capillary Refill: Capillary refill takes less than 2 seconds.  Neurological:     Mental Status: She is alert and oriented to person, place, and time.  Psychiatric:        Behavior: Behavior normal.      Musculoskeletal Exam: C-spine, thoracic spine, and lumbar spine good ROM.  Right shoulder abduction to 60 degrees.  Painful and limited internal rotation of the right shoulder joint.  Left shoulder has full ROM.  Elbow joints, wrist joints, MCPs, PIPs, and DIPs good ROM with no synovitis.  Hip joints, knee joints, ankle joints, MTPs, PIPs, and DIPs good ROM with no synovitis.  No warmth or effusion of knee joints.  No tenderness or  swelling of ankle joints.   CDAI Exam: CDAI Score: 1.2  Patient Global: 1 mm; Provider Global: 1 mm Swollen: 0 ; Tender: 1  Joint Exam 01/21/2020      Right  Left  Glenohumeral   Tender        Investigation: No additional findings.  Imaging: XR Shoulder Right  Result Date: 01/21/2020 No glenohumeral or acromioclavicular joint space narrowing was noted.  No chondrocalcinosis was noted. Impression: Unremarkable x-ray of the shoulder joint.   Recent Labs: Lab Results  Component Value Date   WBC 9.1 11/20/2019   HGB 13.8 11/20/2019   PLT 293 11/20/2019   NA 136 11/20/2019   K 4.1 11/20/2019   CL 101 11/20/2019   CO2 28 11/20/2019   GLUCOSE 76 11/20/2019   BUN 10 11/20/2019   CREATININE 0.77  11/20/2019   BILITOT 0.4 11/20/2019   ALKPHOS 75 06/17/2017   AST 16 11/20/2019   ALT 19 11/20/2019   PROT 7.3 11/20/2019   ALBUMIN 4.5 06/17/2017   CALCIUM 9.1 11/20/2019   GFRAA 106 11/20/2019    Speciality Comments: No specialty comments available.  Procedures:  Large Joint Inj: R glenohumeral on 01/21/2020 9:26 AM Indications: pain Details: 27 G 1.5 in needle, posterior approach  Arthrogram: No  Medications: 1.5 mL lidocaine 1 %; 40 mg triamcinolone acetonide 40 MG/ML Aspirate: 0 mL Outcome: tolerated well, no immediate complications Procedure, treatment alternatives, risks and benefits explained, specific risks discussed. Consent was given by the patient. Immediately prior to procedure a time out was called to verify the correct patient, procedure, equipment, support staff and site/side marked as required. Patient was prepped and draped in the usual sterile fashion.     Allergies: Contrast media [iodinated diagnostic agents]   Assessment / Plan:     Visit Diagnoses: Rheumatoid arthritis with rheumatoid factor of multiple sites without organ or systems involvement ([place]) - +RF, +CCP: She presents today with severe pain and difficulty with active ROM of the right shoulder  joint, which started yesterday afternoon.  She has not been performing any overuse or overhead activities. X-rays of the right shoulder were obtained today, and the patient requested a right shoulder joint cortisone injection. She has no other joint pain or joint swelling at this time. She is overall clinically doing well on Methotrexate 7 tablets po once weekly and folic acid 2 mg po daily.  She will continue taking MTX as prescribed.  She was advised to notify us if she has increased joint pain or joint swelling.  She will follow up in 5 months.   High risk medication use - Methotrexate 7 tablets by mouth once weekly and folic acid 2 mg by mouth daily. CBC and CMP are within normal limits on 11/20/2019.  She will be due to update lab work at the end of March and every 3 months to monitor for drug toxicity.  Standing orders are in place.  She has not had any recent infections.  She was advised to hold methotrexate if she develops any signs or symptoms of infection and to resume once the infection is completely cleared.  She does not plan on receiving the COVID-19 vaccination.  Chronic SI joint pain: She has no SI joint tenderness at this time.   Chronic right shoulder pain - She presents today with right shoulder pain which started yesterday afternoon.  The pain is progressively been getting worse and she experienced nocturnal pain last night.  She is having painful and limited range of motion of the right shoulder on exam.  She has not performed any of her head or overuse activities recently.  No injury is reported.  X-rays of the right shoulder were obtained today and were unremarkable.  Different treatment options were discussed.  She requested a right shoulder joint cortisone injection.  She tolerated the procedure well.  The procedure note was completed above.  She was given a handout shoulder joint exercises to perform.  Plan: XR Shoulder Right  Orders: Orders Placed This Encounter  Procedures  .  Large Joint Inj  . XR Shoulder Right   No orders of the defined types were placed in this encounter.   Face-to-face time spent with patient was 30 minutes. Greater than 50% of time was spent in counseling and coordination of care.  Follow-Up Instructions: Return in about 3  months (around 04/22/2020) for Rheumatoid arthritis.   Ofilia Neas, PA-C   I examined and evaluated the patient with Hazel Sams PA.  Patient has been experiencing pain and discomfort in her right shoulder joint since yesterday.  She is having difficulty lifting her arm.  There is no history of injury.  X-ray obtained today was unremarkable.  After different treatment options were discussed her right shoulder joint was injected with cortisone.  A handout on shoulder joint exercises was given.  She was also given a work excuse as she cannot return to work today.  Postinjection precautions were discussed.  The plan of care was discussed as noted above.  Bo Merino, MD Note - This record has been created using Editor, commissioning.  Chart creation errors have been sought, but may not always  have been located. Such creation errors do not reflect on  the standard of medical care.

## 2020-01-21 NOTE — Telephone Encounter (Signed)
Patient left a voicemail stating she is having "severe pain" in her shoulder.  Patient was scheduled for 8:15 appointment.

## 2020-01-21 NOTE — Patient Instructions (Signed)
Shoulder Exercises Ask your health care provider which exercises are safe for you. Do exercises exactly as told by your health care provider and adjust them as directed. It is normal to feel mild stretching, pulling, tightness, or discomfort as you do these exercises. Stop right away if you feel sudden pain or your pain gets worse. Do not begin these exercises until told by your health care provider. Stretching exercises External rotation and abduction This exercise is sometimes called corner stretch. This exercise rotates your arm outward (external rotation) and moves your arm out from your body (abduction). 1. Stand in a doorway with one of your feet slightly in front of the other. This is called a staggered stance. If you cannot reach your forearms to the door frame, stand facing a corner of a room. 2. Choose one of the following positions as told by your health care provider: ? Place your hands and forearms on the door frame above your head. ? Place your hands and forearms on the door frame at the height of your head. ? Place your hands on the door frame at the height of your elbows. 3. Slowly move your weight onto your front foot until you feel a stretch across your chest and in the front of your shoulders. Keep your head and chest upright and keep your abdominal muscles tight. 4. Hold for __________ seconds. 5. To release the stretch, shift your weight to your back foot. Repeat __________ times. Complete this exercise __________ times a day. Extension, standing 1. Stand and hold a broomstick, a cane, or a similar object behind your back. ? Your hands should be a little wider than shoulder width apart. ? Your palms should face away from your back. 2. Keeping your elbows straight and your shoulder muscles relaxed, move the stick away from your body until you feel a stretch in your shoulders (extension). ? Avoid shrugging your shoulders while you move the stick. Keep your shoulder blades tucked  down toward the middle of your back. 3. Hold for __________ seconds. 4. Slowly return to the starting position. Repeat __________ times. Complete this exercise __________ times a day. Range-of-motion exercises Pendulum  1. Stand near a wall or a surface that you can hold onto for balance. 2. Bend at the waist and let your left / right arm hang straight down. Use your other arm to support you. Keep your back straight and do not lock your knees. 3. Relax your left / right arm and shoulder muscles, and move your hips and your trunk so your left / right arm swings freely. Your arm should swing because of the motion of your body, not because you are using your arm or shoulder muscles. 4. Keep moving your hips and trunk so your arm swings in the following directions, as told by your health care provider: ? Side to side. ? Forward and backward. ? In clockwise and counterclockwise circles. 5. Continue each motion for __________ seconds, or for as long as told by your health care provider. 6. Slowly return to the starting position. Repeat __________ times. Complete this exercise __________ times a day. Shoulder flexion, standing  1. Stand and hold a broomstick, a cane, or a similar object. Place your hands a little more than shoulder width apart on the object. Your left / right hand should be palm up, and your other hand should be palm down. 2. Keep your elbow straight and your shoulder muscles relaxed. Push the stick up with your healthy arm to   raise your left / right arm in front of your body, and then over your head until you feel a stretch in your shoulder (flexion). ? Avoid shrugging your shoulder while you raise your arm. Keep your shoulder blade tucked down toward the middle of your back. 3. Hold for __________ seconds. 4. Slowly return to the starting position. Repeat __________ times. Complete this exercise __________ times a day. Shoulder abduction, standing 1. Stand and hold a broomstick,  a cane, or a similar object. Place your hands a little more than shoulder width apart on the object. Your left / right hand should be palm up, and your other hand should be palm down. 2. Keep your elbow straight and your shoulder muscles relaxed. Push the object across your body toward your left / right side. Raise your left / right arm to the side of your body (abduction) until you feel a stretch in your shoulder. ? Do not raise your arm above shoulder height unless your health care provider tells you to do that. ? If directed, raise your arm over your head. ? Avoid shrugging your shoulder while you raise your arm. Keep your shoulder blade tucked down toward the middle of your back. 3. Hold for __________ seconds. 4. Slowly return to the starting position. Repeat __________ times. Complete this exercise __________ times a day. Internal rotation  1. Place your left / right hand behind your back, palm up. 2. Use your other hand to dangle an exercise band, a towel, or a similar object over your shoulder. Grasp the band with your left / right hand so you are holding on to both ends. 3. Gently pull up on the band until you feel a stretch in the front of your left / right shoulder. The movement of your arm toward the center of your body is called internal rotation. ? Avoid shrugging your shoulder while you raise your arm. Keep your shoulder blade tucked down toward the middle of your back. 4. Hold for __________ seconds. 5. Release the stretch by letting go of the band and lowering your hands. Repeat __________ times. Complete this exercise __________ times a day. Strengthening exercises External rotation  1. Sit in a stable chair without armrests. 2. Secure an exercise band to a stable object at elbow height on your left / right side. 3. Place a soft object, such as a folded towel or a small pillow, between your left / right upper arm and your body to move your elbow about 4 inches (10 cm) away  from your side. 4. Hold the end of the exercise band so it is tight and there is no slack. 5. Keeping your elbow pressed against the soft object, slowly move your forearm out, away from your abdomen (external rotation). Keep your body steady so only your forearm moves. 6. Hold for __________ seconds. 7. Slowly return to the starting position. Repeat __________ times. Complete this exercise __________ times a day. Shoulder abduction  1. Sit in a stable chair without armrests, or stand up. 2. Hold a __________ weight in your left / right hand, or hold an exercise band with both hands. 3. Start with your arms straight down and your left / right palm facing in, toward your body. 4. Slowly lift your left / right hand out to your side (abduction). Do not lift your hand above shoulder height unless your health care provider tells you that this is safe. ? Keep your arms straight. ? Avoid shrugging your shoulder while you   do this movement. Keep your shoulder blade tucked down toward the middle of your back. 5. Hold for __________ seconds. 6. Slowly lower your arm, and return to the starting position. Repeat __________ times. Complete this exercise __________ times a day. Shoulder extension 1. Sit in a stable chair without armrests, or stand up. 2. Secure an exercise band to a stable object in front of you so it is at shoulder height. 3. Hold one end of the exercise band in each hand. Your palms should face each other. 4. Straighten your elbows and lift your hands up to shoulder height. 5. Step back, away from the secured end of the exercise band, until the band is tight and there is no slack. 6. Squeeze your shoulder blades together as you pull your hands down to the sides of your thighs (extension). Stop when your hands are straight down by your sides. Do not let your hands go behind your body. 7. Hold for __________ seconds. 8. Slowly return to the starting position. Repeat __________ times.  Complete this exercise __________ times a day. Shoulder row 1. Sit in a stable chair without armrests, or stand up. 2. Secure an exercise band to a stable object in front of you so it is at waist height. 3. Hold one end of the exercise band in each hand. Position your palms so that your thumbs are facing the ceiling (neutral position). 4. Bend each of your elbows to a 90-degree angle (right angle) and keep your upper arms at your sides. 5. Step back until the band is tight and there is no slack. 6. Slowly pull your elbows back behind you. 7. Hold for __________ seconds. 8. Slowly return to the starting position. Repeat __________ times. Complete this exercise __________ times a day. Shoulder press-ups  1. Sit in a stable chair that has armrests. Sit upright, with your feet flat on the floor. 2. Put your hands on the armrests so your elbows are bent and your fingers are pointing forward. Your hands should be about even with the sides of your body. 3. Push down on the armrests and use your arms to lift yourself off the chair. Straighten your elbows and lift yourself up as much as you comfortably can. ? Move your shoulder blades down, and avoid letting your shoulders move up toward your ears. ? Keep your feet on the ground. As you get stronger, your feet should support less of your body weight as you lift yourself up. 4. Hold for __________ seconds. 5. Slowly lower yourself back into the chair. Repeat __________ times. Complete this exercise __________ times a day. Wall push-ups  1. Stand so you are facing a stable wall. Your feet should be about one arm-length away from the wall. 2. Lean forward and place your palms on the wall at shoulder height. 3. Keep your feet flat on the floor as you bend your elbows and lean forward toward the wall. 4. Hold for __________ seconds. 5. Straighten your elbows to push yourself back to the starting position. Repeat __________ times. Complete this exercise  __________ times a day. This information is not intended to replace advice given to you by your health care provider. Make sure you discuss any questions you have with your health care provider. Document Revised: 03/02/2019 Document Reviewed: 12/08/2018 Else[employer] Patient Education  2020 [employer]  

## 2020-01-22 ENCOUNTER — Encounter: Payer: Self-pay | Admitting: *Deleted

## 2020-01-22 ENCOUNTER — Telehealth: Payer: Self-pay | Admitting: Rheumatology

## 2020-01-22 NOTE — Telephone Encounter (Signed)
Patient called requesting a mild pain pill to help with her shoulder pain.  Patient states she was up til 4:00 am and had to take 3-4 Alleve before she was able to get any pain relief.  Patient states if Dr. Estanislado Pandy approves the medication, please send prescription to:  [facility] at [address] in [place].

## 2020-01-22 NOTE — Telephone Encounter (Signed)
Patient advised letter is ready for pick up.

## 2020-01-22 NOTE — Telephone Encounter (Signed)
I returned patient's call and discussed that she had cortisone injection yesterday it will take at least 72 hours to see some response.  She states she is unable to work due to shoulder joint discomfort.  Please give her a work excuse for today and tomorrow.  If she has persistent discomfort we will schedule MRI of her shoulder joint to evaluate this further.  She will notify us.

## 2020-01-22 NOTE — Telephone Encounter (Signed)
Patient states she was unable to go to work today as she is still having pain in her right arm. Patient states she was given an injection on 01/21/20 and has not gotten any relief. Patient states she did not get any sleep last night. Patient states it was throbbing and hurting every time she moved it. Patient states she took Aleve this morning and got minimal relief. Patient states she has minimal range of motion. Please advise.

## 2020-02-18 ENCOUNTER — Telehealth: Payer: Self-pay | Admitting: Adult Health

## 2020-02-18 NOTE — Telephone Encounter (Signed)
Pt had Mirena IUD placed in 2019 but states that she is having a bleeding. She is wanting to speak with a nurse in regards.

## 2020-02-18 NOTE — Telephone Encounter (Signed)
Pt has the Mirena IUD and is having some bleeding. Pt has not been checking her strings. I advised it would be a good idea to get her in to check IUD placement. Pt voiced understanding and call was transferred to [facility]. [place]

## 2020-02-24 ENCOUNTER — Other Ambulatory Visit: Payer: Self-pay | Admitting: Rheumatology

## 2020-02-25 ENCOUNTER — Other Ambulatory Visit (HOSPITAL_COMMUNITY)
Admission: RE | Admit: 2020-02-25 | Discharge: 2020-02-25 | Disposition: A | Discharge status: Home / Self Care | Payer: 59 | Source: Ambulatory Visit | Attending: Obstetrics & Gynecology | Admitting: Obstetrics & Gynecology

## 2020-02-25 ENCOUNTER — Encounter: Payer: Self-pay | Admitting: Women's Health

## 2020-02-25 ENCOUNTER — Other Ambulatory Visit: Payer: Self-pay

## 2020-02-25 ENCOUNTER — Ambulatory Visit: Payer: 59 | Admitting: Women's Health

## 2020-02-25 VITALS — BP 133/83 | HR 82 | Ht 62.0 in | Wt 231.6 lb

## 2020-02-25 DIAGNOSIS — N921 Excessive and frequent menstruation with irregular cycle: Secondary | ICD-10-CM | POA: Diagnosis not present

## 2020-02-25 DIAGNOSIS — Z975 Presence of (intrauterine) contraceptive device: Secondary | ICD-10-CM

## 2020-02-25 MED ORDER — MEGESTROL ACETATE 40 MG PO TABS: ORAL_TABLET | ORAL | 1 refills | Status: DC

## 2020-02-25 NOTE — Telephone Encounter (Signed)
Ok to refill 30-day supply

## 2020-02-25 NOTE — Progress Notes (Addendum)
   GYN VISIT Patient name: [name] MRN [id number]  Date of birth: 1971/05/01 Chief Complaint:   Follow-up (Check IUD Position, having bleeding)  History of Present Illness:   [name] is a 49 y.o. [id number] African American female being seen today for report of bleeding w/ IUD.  Mirena IUD placed 01/26/18. Has only had spotting 3-4x/yr since insertion. Had spotting in Feb, then bled every day in March, just enough to wear a pad (instead of pantyliner), has lightened some, only sees blood when she wipes now. No pain. Denies abnormal discharge, itching/odor/irritation. No sex in about 31yr.   Depression screen [facility] 2/9 12/13/2018 07/18/2018 07/14/2017  Decreased Interest 0 0 0  Down, Depressed, Hopeless 0 0 0  PHQ - 2 Score 0 0 0    No LMP recorded. (Menstrual status: IUD). The current method of family planning is abstinence and IUD.  Last pap 12/13/18. Results were:  normal Review of Systems:   Pertinent items are noted in HPI Denies fever/chills, dizziness, headaches, visual disturbances, fatigue, shortness of breath, chest pain, abdominal pain, vomiting, abnormal vaginal discharge/itching/odor/irritation, problems with periods, bowel movements, urination, or intercourse unless otherwise stated above.  Pertinent History Reviewed:  Reviewed past medical,surgical, social, obstetrical and family history.  Reviewed problem list, medications and allergies. Physical Assessment:   Vitals:   02/25/20 1628  BP: 133/83  Pulse: 82  Weight: 231 lb 9.6 oz (105.1 kg)  Height: 5\' 2"  (1.575 m)  Body mass index is 42.36 kg/m.       Physical Examination:   General appearance: alert, well appearing, and in no distress  Mental status: alert, oriented to person, place, and time  Skin: warm & dry   Cardiovascular: normal heart rate noted  Respiratory: normal respiratory effort, no distress  Abdomen: soft, non-tender   Pelvic: VULVA: normal appearing vulva with no masses, tenderness or  lesions, VAGINA: normal appearing vagina with normal color and discharge, no lesions, light brown non-odorous d/c CERVIX: normal appearing cervix without discharge or lesions, IUD strings visible  Extremities: no edema  Informal TA u/s: IUD in correct fundal placement Chaperone: Amanda Rash    No results found for this or any previous visit (from the past 24 hour(s)).  Assessment & Plan:  1) BTB on IUD> send CV swab for gc/ct/trich, nonodorous d/c so wet prep not done for BV. Rx megace, let me know if doesn't help  Meds:  Meds ordered this encounter  Medications  . megestrol (MEGACE) 40 MG tablet    Sig: 3x5d, 2x5d, then 1 daily to help control vaginal bleeding. Stop taking 1 week after bleeding stops.    Dispense:  45 tablet    Refill:  1    Order Specific Question:   Supervising Provider    Answer:   Elonda Husky, LUTHER H [[id number]]    No orders of the defined types were placed in this encounter.   Return in about 1 year (around 02/24/2021) for Physical.  [place], [facility] 02/25/2020 4:56 PM

## 2020-02-25 NOTE — Telephone Encounter (Addendum)
Last Visit: 01/21/20 Next Visit: 05/01/20 Labs: 11/20/19 WNL  Attempted to contact the patient and left message for patient to advise she is due to update labs.   Okay to refill 30 day supply MTX?

## 2020-02-26 ENCOUNTER — Telehealth: Payer: Self-pay | Admitting: *Deleted

## 2020-02-26 NOTE — Telephone Encounter (Signed)
I called patient, patient is unable to have labs performed due to attendance at work. She is waiting for [employer] to complete FMLA paper work so that she can have her labs performed. She stated she cannot go between the hours of 7 am to 5 pm when the lab is open or during her lunch.

## 2020-02-27 LAB — CERVICOVAGINAL ANCILLARY ONLY
Chlamydia: NEGATIVE
Comment: NEGATIVE
Comment: NEGATIVE
Comment: NORMAL
Neisseria Gonorrhea: NEGATIVE
Trichomonas: NEGATIVE

## 2020-03-04 ENCOUNTER — Other Ambulatory Visit: Payer: Self-pay | Admitting: Women's Health

## 2020-03-17 ENCOUNTER — Other Ambulatory Visit: Payer: Self-pay | Admitting: Rheumatology

## 2020-03-17 NOTE — Telephone Encounter (Signed)
Last Visit: 01/21/2020 Next Visit: 05/01/2020 Labs: 11/20/2019 CBC and CMP are within normal limits  Advised patient she is due to update labs, patient verbalized understanding and states she just got the FMLA paperwork back and will be getting labs done soon. She does not need a refill right now.

## 2020-03-21 ENCOUNTER — Other Ambulatory Visit: Payer: Self-pay | Admitting: Women's Health

## 2020-04-17 NOTE — Progress Notes (Signed)
Office Visit Note  Patient: [name]             Date of Birth: [birth year]           MRN: [id number]             PCP: Patient, No Pcp Per Referring: No ref. provider found Visit Date: 05/01/2020 Occupation: @GUAROCC @  Subjective:  Medication monitoring   History of Present Illness: [name] is a 49 y.o. female with history of seropositive rheumatoid arthritis.  Patient is taking methotrexate 7 tablets by mouth once weekly and folic acid 2 mg by mouth daily.  She is tolerating methotrexate without any side effects.  She has not had any recent infections.  She is apprehensive to receive the COVID-19 vaccination.  She denies any recent rheumatoid arthritis flares.  She had a right shoulder cortisone injection performed on 01/21/2020 which resolved her discomfort.  She states that about 1 week after her right shoulder injections she started having discomfort in the left shoulder.  She states that she took naproxen for 2 days which resolved her discomfort.  She denies any joint pain or joint swelling at this time.  Activities of Daily Living:  Patient reports morning stiffness for5  minutes.   Patient Denies nocturnal pain.  Difficulty dressing/grooming: Denies Difficulty climbing stairs: Denies Difficulty getting out of chair: Denies Difficulty using hands for taps, buttons, cutlery, and/or writing: Denies  Review of Systems  Constitutional: Positive for fatigue.  HENT: Negative for mouth sores, mouth dryness and nose dryness.   Eyes: Negative for pain, visual disturbance and dryness.  Respiratory: Negative for cough, hemoptysis, shortness of breath and difficulty breathing.   Cardiovascular: Negative for chest pain, palpitations, hypertension and swelling in legs/feet.  Gastrointestinal: Positive for constipation. Negative for blood in stool and diarrhea.  Endocrine: Negative for increased urination.  Genitourinary: Negative for painful urination.  Musculoskeletal:  Positive for morning stiffness. Negative for arthralgias, joint pain, joint swelling, myalgias, muscle weakness, muscle tenderness and myalgias.  Skin: Negative for color change, pallor, rash, hair loss, nodules/bumps, skin tightness, ulcers and sensitivity to sunlight.  Allergic/Immunologic: Negative for susceptible to infections.  Neurological: Negative for dizziness, numbness, headaches and weakness.  Hematological: Negative for swollen glands.  Psychiatric/Behavioral: Positive for sleep disturbance. Negative for depressed mood. The patient is not nervous/anxious.     PMFS History:  Patient Active Problem List   Diagnosis Date Noted  . Multiple thyroid nodules 12/13/2018  . Hair loss 12/13/2018  . Encounter for IUD insertion 01/26/2018  . 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 Multiple acquired skin tags 07/14/2017  . Rheumatoid arthritis with rheumatoid factor of multiple sites without organ or systems involvement ([place]) 11/09/2016  . High risk medication use 11/09/2016  . Non compliance w medication regimen 11/09/2016  . Constipation 06/16/2016  . Obesity 05/16/2015    Past Medical History:  Diagnosis Date  . Arthritis   . BV (bacterial vaginosis)   . Constipation 06/16/2016  . HSV (herpes simplex virus) infection   . IUD (intrauterine device) in place 03/20/2014   IUD inserted 01/23/13  . Obesity   . Osteoarthritis   . RA (rheumatoid arthritis) ([place])   . Trichomonas     Family History  Problem Relation Age of Onset  . Diabetes Mother   . COPD Mother   . Arthritis Mother   . Hypertension Mother   . Heart disease Mother   . Asthma Son   . Cancer Maternal Grandmother  lung  . Heart disease Paternal Grandmother        CHF   Past Surgical History:  Procedure Laterality Date  . APPENDECTOMY    . CHOLECYSTECTOMY    . HAND SURGERY    . OVARIAN CYST REMOVAL N/A 08/17/2007  . TONSILLECTOMY     Social History   Social History Narrative   Works at a [employer].    Has children.   Does not smoke.   Eats meat fruits and vegetables.   Wears her seatbelt.    There is no immunization history on file for this patient.   Objective: Vital Signs: Wt 232 lb (105.2 kg)   BMI 42.43 kg/m    Physical Exam Vitals and nursing note reviewed.  Constitutional:      Appearance: She is well-developed.  HENT:     Head: Normocephalic and atraumatic.  Eyes:     Conjunctiva/sclera: Conjunctivae normal.  Pulmonary:     Effort: Pulmonary effort is normal.  Abdominal:     General: Bowel sounds are normal.     Palpations: Abdomen is soft.  Musculoskeletal:     Cervical back: Normal range of motion.  Lymphadenopathy:     Cervical: No cervical adenopathy.  Skin:    General: Skin is warm and dry.     Capillary Refill: Capillary refill takes less than 2 seconds.  Neurological:     Mental Status: She is alert and oriented to person, place, and time.  Psychiatric:        Behavior: Behavior normal.      Musculoskeletal Exam: C-spine, thoracic spine, lumbar spine good range of motion.  Shoulder joints, elbow joints, wrist joints, MCPs, PIPs, DIPs good range of motion with no synovitis.  Tenderness and inflammation in the left fourth DIP. Flexion contracture of left 4th DIP joint.  She has complete fist formation bilaterally.  Hip joints have good range of motion with no discomfort.  No tenderness over trochanteric bursa bilaterally.  Knee joints have good range of motion with no warmth or effusion.  Ankle joints have good range of motion with no tenderness or inflammation.  No tenderness of MTP joints.  CDAI Exam: CDAI Score: 0.4  Patient Global: 2 mm; Provider Global: 2 mm Swollen: 1 ; Tender: 1  Joint Exam 05/01/2020      Right  Left  DIP 5     Swollen Tender   There is currently no information documented on the homunculus. Go to the Rheumatology activity and complete the homunculus joint exam.  Investigation: No additional findings.  Imaging: No results  found.  Recent Labs: Lab Results  Component Value Date   WBC 9.3 04/18/2020   HGB 14.5 04/18/2020   PLT 288 04/18/2020   NA 136 04/18/2020   K 4.1 04/18/2020   CL 101 04/18/2020   CO2 25 04/18/2020   GLUCOSE 95 04/18/2020   BUN 10 04/18/2020   CREATININE 0.77 04/18/2020   BILITOT 0.4 04/18/2020   ALKPHOS 75 06/17/2017   AST 16 04/18/2020   ALT 18 04/18/2020   PROT 7.1 04/18/2020   ALBUMIN 4.5 06/17/2017   CALCIUM 9.4 04/18/2020   GFRAA 106 04/18/2020    Speciality Comments: No specialty comments available.  Procedures:  No procedures performed Allergies: Contrast media [iodinated diagnostic agents]   Assessment / Plan:     Visit Diagnoses: Rheumatoid arthritis with rheumatoid factor of multiple sites without organ or systems involvement (H[facility]) -  +RF, +CCP: She has no synovitis on exam today.  She has not had any recent rheumatoid arthritis flares.  She had a right shoulder joint cortisone injection performed on 01/21/2020 which resolved her discomfort.  About 1 week later she experienced increased discomfort in the left shoulder and took naproxen for 2 days which resolved her discomfort.  She is not experiencing any joint pain or inflammation at this time.  A mild flexion contracture and tenderness of the left fourth DIP was noted on exam today.  She declined scheduling an ultrasound-guided cortisone injection at this time.  Overall she is clinically doing well on methotrexate 7 tablets by mouth once weekly and folic acid 2 mg by mouth daily.  She will continue on the current treatment regimen.  She was advised to notify us if she develops increased joint pain or joint swelling.  She will follow-up in the office in 5 months  High risk medication use - Methotrexate 7 tablets by mouth once weekly and folic acid 2 mg by mouth daily.  CBC and CMP within normal limits on 04/18/2020.  She will be due to update lab work in August and every 3 months to monitor for drug toxicity.  We discussed  the importance of holding methotrexate if she develops any signs or symptoms of an infection and to resume once the infection has completely cleared.  She was encouraged to receive the COVID-19 vaccination but she is apprehensive at this time.  Chronic SI joint pain: She is not experiencing any SI joint pain at this time.  Chronic right shoulder pain - Resolved.  She has good range of motion of the right shoulder joint on examination.  No tenderness or effusion was noted.  She had a cortisone injection performed on 01/21/2020 which resolved her discomfort.    Orders: No orders of the defined types were placed in this encounter.  Meds ordered this encounter  Medications  . folic acid (FOLVITE) 1 MG tablet    Sig: Take 2 tablets (2 mg total) by mouth daily.    Dispense:  180 tablet    Refill:  3     Follow-Up Instructions: Return in about 5 months (around 10/01/2020) for Rheumatoid arthritis.   Ofilia Neas, PA-C  Note - This record has been created using Dragon software.  Chart creation errors have been sought, but may not always  have been located. Such creation errors do not reflect on  the standard of medical care.

## 2020-04-18 ENCOUNTER — Other Ambulatory Visit: Payer: Self-pay | Admitting: *Deleted

## 2020-04-18 ENCOUNTER — Encounter: Payer: Self-pay | Admitting: *Deleted

## 2020-04-18 DIAGNOSIS — Z79899 Other long term (current) drug therapy: Secondary | ICD-10-CM

## 2020-04-19 LAB — COMPLETE METABOLIC PANEL WITH GFR
AG Ratio: 1.6 (calc) (ref 1.0–2.5)
ALT: 18 U/L (ref 6–29)
AST: 16 U/L (ref 10–35)
Albumin: 4.4 g/dL (ref 3.6–5.1)
Alkaline phosphatase (APISO): 74 U/L (ref 31–125)
BUN: 10 mg/dL (ref 7–25)
CO2: 25 mmol/L (ref 20–32)
Calcium: 9.4 mg/dL (ref 8.6–10.2)
Chloride: 101 mmol/L (ref 98–110)
Creat: 0.77 mg/dL (ref 0.50–1.10)
GFR, Est African American: 106 mL/min/{1.73_m2} (ref >=60)
GFR, Est Non African American: 91 mL/min/{1.73_m2} (ref >=60)
Globulin: 2.7 g/dL (calc) (ref 1.9–3.7)
Glucose, Bld: 95 mg/dL (ref 65–99)
Potassium: 4.1 mmol/L (ref 3.5–5.3)
Sodium: 136 mmol/L (ref 135–146)
Total Bilirubin: 0.4 mg/dL (ref 0.2–1.2)
Total Protein: 7.1 g/dL (ref 6.1–8.1)

## 2020-04-19 LAB — CBC WITH DIFFERENTIAL/PLATELET
Absolute Monocytes: 716 cells/uL (ref 200–950)
Basophils Absolute: 93 cells/uL (ref 0–200)
Basophils Relative: 1 %
Eosinophils Absolute: 130 cells/uL (ref 15–500)
Eosinophils Relative: 1.4 %
HCT: 43.2 % (ref 35.0–45.0)
Hemoglobin: 14.5 g/dL (ref 11.7–15.5)
Lymphs Abs: 1962 cells/uL (ref 850–3900)
MCH: 31.7 pg (ref 27.0–33.0)
MCHC: 33.6 g/dL (ref 32.0–36.0)
MCV: 94.3 fL (ref 80.0–100.0)
MPV: 10.1 fL (ref 7.5–12.5)
Monocytes Relative: 7.7 %
Neutro Abs: 6398 cells/uL (ref 1500–7800)
Neutrophils Relative %: 68.8 %
Platelets: 288 10*3/uL (ref 140–400)
RBC: 4.58 10*6/uL (ref 3.80–5.10)
RDW: 13 % (ref 11.0–15.0)
Total Lymphocyte: 21.1 %
WBC: 9.3 10*3/uL (ref 3.8–10.8)

## 2020-04-20 NOTE — Progress Notes (Signed)
CBC and CMP are within normal limits.

## 2020-04-23 ENCOUNTER — Other Ambulatory Visit: Payer: Self-pay | Admitting: Rheumatology

## 2020-04-23 NOTE — Telephone Encounter (Signed)
Last Visit: 01/21/2020 Next Visit: 05/01/2020 Labs: 04/18/2020 WNL  Current Dose per office note on 01/21/2020: Methotrexate 7 tablets by mouth once weekly   Okay to refill per Dr. Estanislado Pandy

## 2020-05-01 ENCOUNTER — Encounter: Payer: Self-pay | Admitting: Physician Assistant

## 2020-05-01 ENCOUNTER — Other Ambulatory Visit: Payer: Self-pay

## 2020-05-01 ENCOUNTER — Ambulatory Visit: Payer: 59 | Admitting: Physician Assistant

## 2020-05-01 VITALS — BP 141/91 | HR 81 | Resp 16 | Ht 62.0 in | Wt 232.0 lb

## 2020-05-01 DIAGNOSIS — Z79899 Other long term (current) drug therapy: Secondary | ICD-10-CM

## 2020-05-01 DIAGNOSIS — M533 Sacrococcygeal disorders, not elsewhere classified: Secondary | ICD-10-CM | POA: Diagnosis not present

## 2020-05-01 DIAGNOSIS — M0579 Rheumatoid arthritis with rheumatoid factor of multiple sites without organ or systems involvement: Secondary | ICD-10-CM | POA: Diagnosis not present

## 2020-05-01 DIAGNOSIS — M25511 Pain in right shoulder: Secondary | ICD-10-CM | POA: Diagnosis not present

## 2020-05-01 DIAGNOSIS — G8929 Other chronic pain: Secondary | ICD-10-CM

## 2020-05-01 MED ORDER — FOLIC ACID 1 MG PO TABS: 2.0000 mg | ORAL_TABLET | Freq: Every day | ORAL | 3 refills | Status: DC

## 2020-05-01 NOTE — Patient Instructions (Signed)
Standing Labs We placed an order today for your standing lab work.    Please come back and get your standing labs in August and every 3 months   We have open lab daily Monday through Thursday from 8:30-12:30 PM and 1:30-4:30 PM and Friday from 8:30-12:30 PM and 1:30-4:00 PM at the office of Dr. Shaili Deveshwar.   You may experience shorter wait times on Monday and Friday afternoons. The office is located at [address], [address], [place],  [address] No appointment is necessary.   Labs are drawn by [facility].  You may receive a bill from [facility] for your lab work.  If you wish to have your labs drawn at another location, please call the office 24 hours in advance to send orders.  If you have any questions regarding directions or hours of operation,  please call [phone].   Just as a reminder please drink plenty of water prior to coming for your lab work. Thanks!   

## 2020-05-28 ENCOUNTER — Ambulatory Visit: Payer: No Typology Code available for payment source | Admitting: Physician Assistant

## 2020-07-09 ENCOUNTER — Ambulatory Visit: Payer: No Typology Code available for payment source | Admitting: Rheumatology

## 2020-07-09 ENCOUNTER — Other Ambulatory Visit: Payer: Self-pay

## 2020-07-09 ENCOUNTER — Encounter: Payer: Self-pay | Admitting: Rheumatology

## 2020-07-09 ENCOUNTER — Ambulatory Visit (INDEPENDENT_AMBULATORY_CARE_PROVIDER_SITE_OTHER): Payer: No Typology Code available for payment source

## 2020-07-09 VITALS — BP 140/92 | HR 87 | Resp 15 | Ht 62.0 in | Wt 238.2 lb

## 2020-07-09 DIAGNOSIS — M533 Sacrococcygeal disorders, not elsewhere classified: Secondary | ICD-10-CM | POA: Diagnosis not present

## 2020-07-09 DIAGNOSIS — G8929 Other chronic pain: Secondary | ICD-10-CM

## 2020-07-09 DIAGNOSIS — M0579 Rheumatoid arthritis with rheumatoid factor of multiple sites without organ or systems involvement: Secondary | ICD-10-CM | POA: Diagnosis not present

## 2020-07-09 DIAGNOSIS — M25511 Pain in right shoulder: Secondary | ICD-10-CM

## 2020-07-09 DIAGNOSIS — Z79899 Other long term (current) drug therapy: Secondary | ICD-10-CM | POA: Diagnosis not present

## 2020-07-09 DIAGNOSIS — M25512 Pain in left shoulder: Secondary | ICD-10-CM

## 2020-07-09 MED ORDER — TRIAMCINOLONE ACETONIDE 40 MG/ML IJ SUSP
40.0000 mg | INTRAMUSCULAR | Status: AC | PRN
  Administered 2020-07-09: 40 mg via INTRA_ARTICULAR

## 2020-07-09 MED ORDER — LIDOCAINE HCL 1 % IJ SOLN
1.5000 mL | INTRAMUSCULAR | Status: AC | PRN
  Administered 2020-07-09: 1.5 mL

## 2020-07-09 NOTE — Patient Instructions (Addendum)
Standing Labs We placed an order today for your standing lab work.   Please have your standing labs drawn in November and every 3 months   If possible, please have your labs drawn 2 weeks prior to your appointment so that the provider can discuss your results at your appointment.  We have open lab daily Monday through Thursday from 8:30-12:30 PM and 1:30-4:30 PM and Friday from 8:30-12:30 PM and 1:30-4:00 PM at the office of Dr. Bo Merino, [facility].   Please be advised, patients with office appointments requiring lab work will take precedents over walk-in lab work.  If possible, please come for your lab work on Monday and Friday afternoons, as you may experience shorter wait times. The office is located at [address] [place], [place], [place] [address] No appointment is necessary.   Labs are drawn by [facility]. Please bring your co-pay at the time of your lab draw.  You may receive a bill from [facility] for your lab work.  If you wish to have your labs drawn at another location, please call the office 24 hours in advance to send orders.  If you have any questions regarding directions or hours of operation,  please call [phone].   As a reminder, please drink plenty of water prior to coming for your lab work. Thanks!  COVID-19 vaccine recommendations:   COVID-19 vaccine is recommended for everyone (unless you are allergic to a vaccine component), even if you are on a medication that suppresses your immune system.   If you are on Methotrexate, Cellcept (mycophenolate), Rinvoq, Morrie Sheldon, and Olumiant- hold the medication for 1 week after each vaccine. Hold Methotrexate for 2 weeks after the single dose COVID-19 vaccine.   If you are on Orencia subcutaneous injection - hold medication one week prior to and one week after the first COVID-19 vaccine dose (only).   If you are on Orencia IV infusions- time vaccination administration so that the first COVID-19  vaccination will occur four weeks after the infusion and postpone the subsequent infusion by one week.   If you are on Cyclophosphamide or Rituxan infusions please contact your doctor prior to receiving the COVID-19 vaccine.   Do not take Tylenol or ant anti-inflammatory medications (NSAIDs) 24 hours prior to the COVID-19 vaccination.   There is no direct evidence about the efficacy of the COVID-19 vaccine in individuals who are on medications that suppress the immune system.   Even if you are fully vaccinated, and you are on any medications that suppress your immune system, please continue to wear a mask, maintain at least six feet social distance and practice hand hygiene.   If you develop a COVID-19 infection, please contact your PCP or our office to determine if you need antibody infusion.  The booster vaccine is now available for immunocompromised patients. It is advised that if you had Pfizer vaccine you should get [employer] booster.  If you had a Moderna vaccine then you should get a Moderna booster. Johnson and Wynetta Emery does not have a booster vaccine at this time.  Please see the following web sites for updated information.   https://www.rheumatology.org/Portals/0/Files/COVID-19-Vaccination-Patient-Resources.pdf  https://www.rheumatology.org/About-Us/Newsroom/Press-Releases/ID/1159     Shoulder Exercises Ask your health care provider which exercises are safe for you. Do exercises exactly as told by your health care provider and adjust them as directed. It is normal to feel mild stretching, pulling, tightness, or discomfort as you do these exercises. Stop right away if you feel sudden pain or your pain gets  worse. Do not begin these exercises until told by your health care provider. Stretching exercises External rotation and abduction This exercise is sometimes called corner stretch. This exercise rotates your arm outward (external rotation) and moves your arm out from your body  (abduction). 1. Stand in a doorway with one of your feet slightly in front of the other. This is called a staggered stance. If you cannot reach your forearms to the door frame, stand facing a corner of a room. 2. Choose one of the following positions as told by your health care provider: ? Place your hands and forearms on the door frame above your head. ? Place your hands and forearms on the door frame at the height of your head. ? Place your hands on the door frame at the height of your elbows. 3. Slowly move your weight onto your front foot until you feel a stretch across your chest and in the front of your shoulders. Keep your head and chest upright and keep your abdominal muscles tight. 4. Hold for __________ seconds. 5. To release the stretch, shift your weight to your back foot. Repeat __________ times. Complete this exercise __________ times a day. Extension, standing 1. Stand and hold a broomstick, a cane, or a similar object behind your back. ? Your hands should be a little wider than shoulder width apart. ? Your palms should face away from your back. 2. Keeping your elbows straight and your shoulder muscles relaxed, move the stick away from your body until you feel a stretch in your shoulders (extension). ? Avoid shrugging your shoulders while you move the stick. Keep your shoulder blades tucked down toward the middle of your back. 3. Hold for __________ seconds. 4. Slowly return to the starting position. Repeat __________ times. Complete this exercise __________ times a day. Range-of-motion exercises Pendulum  1. Stand near a wall or a surface that you can hold onto for balance. 2. Bend at the waist and let your left / right arm hang straight down. Use your other arm to support you. Keep your back straight and do not lock your knees. 3. Relax your left / right arm and shoulder muscles, and move your hips and your trunk so your left / right arm swings freely. Your arm should swing  because of the motion of your body, not because you are using your arm or shoulder muscles. 4. Keep moving your hips and trunk so your arm swings in the following directions, as told by your health care provider: ? Side to side. ? Forward and backward. ? In clockwise and counterclockwise circles. 5. Continue each motion for __________ seconds, or for as long as told by your health care provider. 6. Slowly return to the starting position. Repeat __________ times. Complete this exercise __________ times a day. Shoulder flexion, standing  1. Stand and hold a broomstick, a cane, or a similar object. Place your hands a little more than shoulder width apart on the object. Your left / right hand should be palm up, and your other hand should be palm down. 2. Keep your elbow straight and your shoulder muscles relaxed. Push the stick up with your healthy arm to raise your left / right arm in front of your body, and then over your head until you feel a stretch in your shoulder (flexion). ? Avoid shrugging your shoulder while you raise your arm. Keep your shoulder blade tucked down toward the middle of your back. 3. Hold for __________ seconds. 4. Slowly return  to the starting position. Repeat __________ times. Complete this exercise __________ times a day. Shoulder abduction, standing 1. Stand and hold a broomstick, a cane, or a similar object. Place your hands a little more than shoulder width apart on the object. Your left / right hand should be palm up, and your other hand should be palm down. 2. Keep your elbow straight and your shoulder muscles relaxed. Push the object across your body toward your left / right side. Raise your left / right arm to the side of your body (abduction) until you feel a stretch in your shoulder. ? Do not raise your arm above shoulder height unless your health care provider tells you to do that. ? If directed, raise your arm over your head. ? Avoid shrugging your shoulder  while you raise your arm. Keep your shoulder blade tucked down toward the middle of your back. 3. Hold for __________ seconds. 4. Slowly return to the starting position. Repeat __________ times. Complete this exercise __________ times a day. Internal rotation  1. Place your left / right hand behind your back, palm up. 2. Use your other hand to dangle an exercise band, a towel, or a similar object over your shoulder. Grasp the band with your left / right hand so you are holding on to both ends. 3. Gently pull up on the band until you feel a stretch in the front of your left / right shoulder. The movement of your arm toward the center of your body is called internal rotation. ? Avoid shrugging your shoulder while you raise your arm. Keep your shoulder blade tucked down toward the middle of your back. 4. Hold for __________ seconds. 5. Release the stretch by letting go of the band and lowering your hands. Repeat __________ times. Complete this exercise __________ times a day. Strengthening exercises External rotation  1. Sit in a stable chair without armrests. 2. Secure an exercise band to a stable object at elbow height on your left / right side. 3. Place a soft object, such as a folded towel or a small pillow, between your left / right upper arm and your body to move your elbow about 4 inches (10 cm) away from your side. 4. Hold the end of the exercise band so it is tight and there is no slack. 5. Keeping your elbow pressed against the soft object, slowly move your forearm out, away from your abdomen (external rotation). Keep your body steady so only your forearm moves. 6. Hold for __________ seconds. 7. Slowly return to the starting position. Repeat __________ times. Complete this exercise __________ times a day. Shoulder abduction  1. Sit in a stable chair without armrests, or stand up. 2. Hold a __________ weight in your left / right hand, or hold an exercise band with both  hands. 3. Start with your arms straight down and your left / right palm facing in, toward your body. 4. Slowly lift your left / right hand out to your side (abduction). Do not lift your hand above shoulder height unless your health care provider tells you that this is safe. ? Keep your arms straight. ? Avoid shrugging your shoulder while you do this movement. Keep your shoulder blade tucked down toward the middle of your back. 5. Hold for __________ seconds. 6. Slowly lower your arm, and return to the starting position. Repeat __________ times. Complete this exercise __________ times a day. Shoulder extension 1. Sit in a stable chair without armrests, or stand up. 2.  Secure an exercise band to a stable object in front of you so it is at shoulder height. 3. Hold one end of the exercise band in each hand. Your palms should face each other. 4. Straighten your elbows and lift your hands up to shoulder height. 5. Step back, away from the secured end of the exercise band, until the band is tight and there is no slack. 6. Squeeze your shoulder blades together as you pull your hands down to the sides of your thighs (extension). Stop when your hands are straight down by your sides. Do not let your hands go behind your body. 7. Hold for __________ seconds. 8. Slowly return to the starting position. Repeat __________ times. Complete this exercise __________ times a day. Shoulder row 1. Sit in a stable chair without armrests, or stand up. 2. Secure an exercise band to a stable object in front of you so it is at waist height. 3. Hold one end of the exercise band in each hand. Position your palms so that your thumbs are facing the ceiling (neutral position). 4. Bend each of your elbows to a 90-degree angle (right angle) and keep your upper arms at your sides. 5. Step back until the band is tight and there is no slack. 6. Slowly pull your elbows back behind you. 7. Hold for __________ seconds. 8. Slowly  return to the starting position. Repeat __________ times. Complete this exercise __________ times a day. Shoulder press-ups  1. Sit in a stable chair that has armrests. Sit upright, with your feet flat on the floor. 2. Put your hands on the armrests so your elbows are bent and your fingers are pointing forward. Your hands should be about even with the sides of your body. 3. Push down on the armrests and use your arms to lift yourself off the chair. Straighten your elbows and lift yourself up as much as you comfortably can. ? Move your shoulder blades down, and avoid letting your shoulders move up toward your ears. ? Keep your feet on the ground. As you get stronger, your feet should support less of your body weight as you lift yourself up. 4. Hold for __________ seconds. 5. Slowly lower yourself back into the chair. Repeat __________ times. Complete this exercise __________ times a day. Wall push-ups  1. Stand so you are facing a stable wall. Your feet should be about one arm-length away from the wall. 2. Lean forward and place your palms on the wall at shoulder height. 3. Keep your feet flat on the floor as you bend your elbows and lean forward toward the wall. 4. Hold for __________ seconds. 5. Straighten your elbows to push yourself back to the starting position. Repeat __________ times. Complete this exercise __________ times a day. This information is not intended to replace advice given to you by your health care provider. Make sure you discuss any questions you have with your health care provider. Document Revised: 03/02/2019 Document Reviewed: 12/08/2018 Elsevier Patient Education  [place].

## 2020-07-09 NOTE — Progress Notes (Signed)
Office Visit Note  Patient: [name]             Date of Birth: Aug 24, 1971           MRN: [id number]             PCP: Patient, No Pcp Per Referring: No ref. provider found Visit Date: 07/09/2020 Occupation: @GUAROCC @  Subjective:  Left shoulder joint pain   History of Present Illness: [name] is a 49 y.o. female with history of seropositive rheumatoid arthritis.  She is taking methotrexate 7 tabs mouth once weekly and folic acid 2 mg by mouth daily.  She has not missed any doses of methotrexate recently.  She has not had any recent infections.  She has received the first Covid vaccine. She presents today with left shoulder joint pain which started 1 week ago.  She denies any injury or overuse overhead activities.  She has been experiencing intermittent nocturnal pain.  She has limited and painful range of motion.  She states that her right shoulder joint pain resolved after having a cortisone injection on 01/21/2020.  She denies any other joint pain or joint swelling at this time.   Activities of Daily Living:  Patient reports morning stiffness for 15  minutes.   Patient Denies nocturnal pain.  Difficulty dressing/grooming: Denies Difficulty climbing stairs: Denies Difficulty getting out of chair: Denies Difficulty using hands for taps, buttons, cutlery, and/or writing: Denies  Review of Systems  Constitutional: Negative for fatigue.  HENT: Negative for mouth sores, mouth dryness and nose dryness.   Eyes: Negative for pain, itching, visual disturbance and dryness.  Respiratory: Negative for cough, hemoptysis, shortness of breath and difficulty breathing.   Cardiovascular: Negative for chest pain, palpitations, hypertension and swelling in legs/feet.  Gastrointestinal: Negative for blood in stool, constipation and diarrhea.  Endocrine: Negative for increased urination.  Genitourinary: Negative for difficulty urinating and painful urination.  Musculoskeletal:  Positive for arthralgias, joint pain, joint swelling and morning stiffness. Negative for myalgias, muscle weakness, muscle tenderness and myalgias.  Skin: Negative for color change, pallor, rash, hair loss, nodules/bumps, redness, skin tightness, ulcers and sensitivity to sunlight.  Allergic/Immunologic: Negative for susceptible to infections.  Neurological: Positive for light-headedness. Negative for dizziness, numbness, headaches, memory loss and weakness.  Hematological: Negative for bruising/bleeding tendency and swollen glands.  Psychiatric/Behavioral: Negative for depressed mood, confusion and sleep disturbance. The patient is not nervous/anxious.     PMFS History:  Patient Active Problem List   Diagnosis Date Noted  . Multiple thyroid nodules 12/13/2018  . Hair loss 12/13/2018  . Encounter for IUD insertion 01/26/2018  . 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 Multiple acquired skin tags 07/14/2017  . Rheumatoid arthritis with rheumatoid factor of multiple sites without organ or systems involvement ([place]) 11/09/2016  . High risk medication use 11/09/2016  . Non compliance w medication regimen 11/09/2016  . Constipation 06/16/2016  . Obesity 05/16/2015    Past Medical History:  Diagnosis Date  . Arthritis   . BV (bacterial vaginosis)   . Constipation 06/16/2016  . HSV (herpes simplex virus) infection   . IUD (intrauterine device) in place 03/20/2014   IUD inserted 01/23/13  . Obesity   . Osteoarthritis   . RA (rheumatoid arthritis) ([place])   . Trichomonas     Family History  Problem Relation Age of Onset  . Diabetes Mother   . COPD Mother   . Arthritis Mother   . Hypertension Mother   .  Heart disease Mother   . Asthma Son   . Cancer Maternal Grandmother        lung  . Heart disease Paternal Grandmother        CHF   Past Surgical History:  Procedure Laterality Date  . APPENDECTOMY    . CHOLECYSTECTOMY    . HAND SURGERY    . OVARIAN CYST REMOVAL N/A 08/17/2007  .  TONSILLECTOMY     Social History   Social History Narrative   Works at a [employer].   Has children.   Does not smoke.   Eats meat fruits and vegetables.   Wears her seatbelt.   Immunization History  Administered Date(s) Administered  . PFIZER SARS-COV-2 Vaccination 07/02/2020     Objective: Vital Signs: BP (!) 140/92 (BP Location: Right Arm, Patient Position: Sitting, Cuff Size: Normal)   Pulse 87   Resp 15   Ht 5\' 2"  (1.575 m)   Wt 238 lb 3.2 oz (108 kg)   BMI 43.57 kg/m    Physical Exam Vitals and nursing note reviewed.  Constitutional:      Appearance: She is well-developed.  HENT:     Head: Normocephalic and atraumatic.  Eyes:     Conjunctiva/sclera: Conjunctivae normal.  Pulmonary:     Effort: Pulmonary effort is normal.  Abdominal:     General: Bowel sounds are normal.     Palpations: Abdomen is soft.  Musculoskeletal:     Cervical back: Normal range of motion.  Lymphadenopathy:     Cervical: No cervical adenopathy.  Skin:    General: Skin is warm and dry.     Capillary Refill: Capillary refill takes less than 2 seconds.  Neurological:     Mental Status: She is alert and oriented to person, place, and time.  Psychiatric:        Behavior: Behavior normal.      Musculoskeletal Exam: C-spine, thoracic spine, and lumbar spine have good range of motion.  Right shoulder has full range of motion with no discomfort.  Left shoulder has painful and extremely limited active range of motion.  Passive forward flexion to about 90 degrees.  Elbow joints, wrist joints, MCPs, PIPs, and DIPs have good range of motion with no synovitis.  She has complete fist formation bilaterally.  Hip joints, knee joints, and ankle joints have good range of motion with no discomfort.  No warmth or effusion of knee joints noted.  No tenderness or swelling of ankle joints.  CDAI Exam: CDAI Score: 1.4  Patient Global: 2 mm; Provider Global: 2 mm Swollen: 0 ; Tender: 1  Joint Exam  07/09/2020      Right  Left  Glenohumeral      Tender     Investigation: No additional findings.  Imaging: XR Shoulder Left  Result Date: 07/09/2020 Inferior subluxation of humerus was noted.  No acromioclavicular joint space narrowing was noted.  No chondrocalcinosis was noted. Impression: This raises concern of rotator cuff tear.  No glenohumeral or acromioclavicular arthritis was noted.   Recent Labs: Lab Results  Component Value Date   WBC 9.3 04/18/2020   HGB 14.5 04/18/2020   PLT 288 04/18/2020   NA 136 04/18/2020   K 4.1 04/18/2020   CL 101 04/18/2020   CO2 25 04/18/2020   GLUCOSE 95 04/18/2020   BUN 10 04/18/2020   CREATININE 0.77 04/18/2020   BILITOT 0.4 04/18/2020   ALKPHOS 75 06/17/2017   AST 16 04/18/2020   ALT 18 04/18/2020  PROT 7.1 04/18/2020   ALBUMIN 4.5 06/17/2017   CALCIUM 9.4 04/18/2020   GFRAA 106 04/18/2020    Speciality Comments: No specialty comments available.  Procedures:  Large Joint Inj: L glenohumeral on 07/09/2020 12:11 PM Indications: pain Details: 27 G 1.5 in needle, anterior approach  Arthrogram: No  Medications: 1.5 mL lidocaine 1 %; 40 mg triamcinolone acetonide 40 MG/ML Aspirate: 0 mL Outcome: tolerated well, no immediate complications Procedure, treatment alternatives, risks and benefits explained, specific risks discussed. Consent was given by the patient. Immediately prior to procedure a time out was called to verify the correct patient, procedure, equipment, support staff and site/side marked as required. Patient was prepped and draped in the usual sterile fashion.     Allergies: Contrast media [iodinated diagnostic agents]   Assessment / Plan:     Visit Diagnoses: Rheumatoid arthritis with rheumatoid factor of multiple sites without organ or systems involvement ([place]) - +RF, +CCP: She has no synovitis on examination.  She presents today with left shoulder joint pain which started 1 week ago.  She has painful and limited  active and passive range of motion on exam.  She has not had any injury or overuse activities recently.  X-rays of the left shoulder joint were obtained today and the left shoulder was injected with cortisone.  She is not experiencing any other joint pain or inflammation at this time.  Overall she is clinically been doing well on methotrexate 7 tablets by mouth once weekly and folic acid 2 mg by mouth daily.  She'll continue on the current treatment regimen.  She was advised to notify us of her left shoulder joint pain persists or worsens.  She will follow-up in the office in 5 months.  High risk medication use - Methotrexate 7 tablets by mouth once weekly and folic acid 2 mg by mouth daily.  CBC and CMP were within normal limits on 04/18/2020.  She is due to update lab work today.  Orders for CBC and CMP were released.  She will update lab work in November and every 3 months to monitor for drug toxicity.- Plan: COMPLETE METABOLIC PANEL WITH GFR, CBC with Differential/Platelet She has received the 1st COVID-19 vaccination and is waiting to receive her 2nd.  She does not plan on receiving a booster vaccine in the future.  We discussed the importance of holding methotrexate 1 week after each vaccine dose.  We also discussed the importance of holding methotrexate if she develop signs or symptoms of an infection.  If she develops a COVID-19 infection she was advised to notify us or her PCP in order to receive the antibody infusion.  She was encouraged to continue to wear her mask, social distance, and practice good hand hygiene.  Chronic SI joint pain: She experiences intermittent SI joint pain and stiffness 1st thing in the morning.  She has no tenderness today.  Chronic right shoulder pain - She had a cortisone injection performed on 01/21/2020 which resolved her discomfort.  Chronic left shoulder pain -She presents today with left shoulder joint pain which started 1 week ago.  She has not had any injuries,  falls, or overuse activities.  She has limited and painful active and passive range of motion.  She has been experiencing nocturnal pain intermittently.  X-rays of the left shoulder joint were obtained today.  The left shoulder joint was injected with cortisone.  The procedure note was completed above.  Aftercare was discussed.  She was advised to notify us of  her left shoulder joint pain persists or worsens and we'll proceed with scheduling a MRI.  She was given a handout of shoulder joint exercises to perform.  Plan: XR Shoulder Left  Orders: Orders Placed This Encounter  Procedures  . Large Joint Inj: L glenohumeral  . XR Shoulder Left  . COMPLETE METABOLIC PANEL WITH GFR  . CBC with Differential/Platelet   No orders of the defined types were placed in this encounter.    Follow-Up Instructions: Return in about 5 months (around 12/09/2020) for Rheumatoid arthritis.   Acquanetta Sit, PA-C  I examined and evaluated the patient with Kendra Sams PA.  She has been experiencing a lot of pain and discomfort in her left shoulder joint.  She had limited range of motion of her shoulder joint with forward flexion and abduction.  After informed consent was obtained the shoulder joint was injected with cortisone as described above.  Her x-ray raises concern for possible rotator cuff tear.  If her symptoms do not improve over the next week I may consider obtaining MRI of the shoulder joint.  The plan of care was discussed as noted above.  Bo Merino, MD  Note - This record has been created using Editor, commissioning.  Chart creation errors have been sought, but may not always  have been located. Such creation errors do not reflect on  the standard of medical care.

## 2020-07-10 LAB — CBC WITH DIFFERENTIAL/PLATELET
Absolute Monocytes: 720 cells/uL (ref 200–950)
Basophils Absolute: 58 cells/uL (ref 0–200)
Basophils Relative: 0.6 %
Eosinophils Absolute: 182 cells/uL (ref 15–500)
Eosinophils Relative: 1.9 %
HCT: 41.4 % (ref 35.0–45.0)
Hemoglobin: 13.7 g/dL (ref 11.7–15.5)
Lymphs Abs: 1776 cells/uL (ref 850–3900)
MCH: 31.5 pg (ref 27.0–33.0)
MCHC: 33.1 g/dL (ref 32.0–36.0)
MCV: 95.2 fL (ref 80.0–100.0)
MPV: 9.9 fL (ref 7.5–12.5)
Monocytes Relative: 7.5 %
Neutro Abs: 6864 cells/uL (ref 1500–7800)
Neutrophils Relative %: 71.5 %
Platelets: 302 10*3/uL (ref 140–400)
RBC: 4.35 10*6/uL (ref 3.80–5.10)
RDW: 13.7 % (ref 11.0–15.0)
Total Lymphocyte: 18.5 %
WBC: 9.6 10*3/uL (ref 3.8–10.8)

## 2020-07-10 LAB — COMPLETE METABOLIC PANEL WITH GFR
AG Ratio: 1.5 (calc) (ref 1.0–2.5)
ALT: 36 U/L — ABNORMAL HIGH (ref 6–29)
AST: 26 U/L (ref 10–35)
Albumin: 4.3 g/dL (ref 3.6–5.1)
Alkaline phosphatase (APISO): 79 U/L (ref 31–125)
BUN: 11 mg/dL (ref 7–25)
CO2: 30 mmol/L (ref 20–32)
Calcium: 9.1 mg/dL (ref 8.6–10.2)
Chloride: 102 mmol/L (ref 98–110)
Creat: 0.74 mg/dL (ref 0.50–1.10)
GFR, Est African American: 110 mL/min/{1.73_m2} (ref >=60)
GFR, Est Non African American: 95 mL/min/{1.73_m2} (ref >=60)
Globulin: 2.8 g/dL (calc) (ref 1.9–3.7)
Glucose, Bld: 90 mg/dL (ref 65–99)
Potassium: 4.3 mmol/L (ref 3.5–5.3)
Sodium: 136 mmol/L (ref 135–146)
Total Bilirubin: 0.6 mg/dL (ref 0.2–1.2)
Total Protein: 7.1 g/dL (ref 6.1–8.1)

## 2020-07-10 NOTE — Progress Notes (Signed)
ALT is mildly elevated.  CBC is normal.  Please advise patient to avoid NSAIDs or alcohol use.

## 2020-07-13 ENCOUNTER — Other Ambulatory Visit: Payer: Self-pay | Admitting: Rheumatology

## 2020-07-13 DIAGNOSIS — Z79899 Other long term (current) drug therapy: Secondary | ICD-10-CM

## 2020-07-14 ENCOUNTER — Telehealth: Payer: Self-pay | Admitting: *Deleted

## 2020-07-14 NOTE — Addendum Note (Signed)
Addended by: Carole Binning on: 07/14/2020 01:23 PM   Modules accepted: Orders

## 2020-07-14 NOTE — Telephone Encounter (Signed)
Last Visit: 07/09/2020 Next Visit: 10/02/2020 Labs: 07/09/2020 ALT is mildly elevated. CBC is normal.   Current Dose per office note 07/09/2020: Methotrexate 7 tablets by mouth once weekly  DX:  Rheumatoid arthritis with rheumatoid factor of multiple sites without organ or systems involvement   Okay to refill MTX?

## 2020-07-14 NOTE — Telephone Encounter (Signed)
Patient should have repeat LFTs in a month.  Please ask her to avoid any NSAIDs or Tylenol.

## 2020-07-14 NOTE — Telephone Encounter (Signed)
Left message to advise patient she can take Tylenol only occasionally.

## 2020-07-14 NOTE — Telephone Encounter (Signed)
Patient advised should have repeat LFTs in a month.  Patient advised to avoid any NSAIDs or Tylenol. Patient would like to know what she can take for pain.

## 2020-07-14 NOTE — Telephone Encounter (Signed)
Spoke with patient who states she is feeling much better. Patient states she is able to lift her arm.

## 2020-07-14 NOTE — Telephone Encounter (Signed)
-----   Message from Shona Needles, RT sent at 07/09/2020 12:10 PM EDT ----- Regarding: FW: REVIEW X-RAY I called patient. Patient will call next week if no improvement. ----- Message ----- From: Eunice Blase, MD Sent: 07/09/2020  11:46 AM EDT To: Shona Needles, RT Subject: RE: REVIEW X-RAY                               Yes, could be torn.  But may want to wait until next week to see how the injection works.  If no better by Monday, then MRI.  Ronalee Belts   ----- Message ----- From: Shona Needles, RT Sent: 07/09/2020  10:42 AM EDT To: Eunice Blase, MD Subject: REVIEW X-RAY                                   Good morning Dr. Junius Roads, when you have a moment could you please look at the lshoulder x-rays obtained for this patient today? Dr. Estanislado Pandy is concerned about a rotator cuff tear. When she did her measurement, the measurement is more than 10 which she indicates this could be a sign of a tear. She did perform a steroid injection. Should she order an MRI? Thank you. I hope you have a wonderful day!

## 2020-07-14 NOTE — Telephone Encounter (Signed)
She can take Tylenol only occasionally.

## 2020-08-29 ENCOUNTER — Other Ambulatory Visit: Payer: Self-pay

## 2020-08-29 ENCOUNTER — Encounter: Payer: Self-pay | Admitting: *Deleted

## 2020-08-29 DIAGNOSIS — Z79899 Other long term (current) drug therapy: Secondary | ICD-10-CM

## 2020-08-30 LAB — AST: AST: 18 U/L (ref 10–35)

## 2020-08-30 LAB — ALT: ALT: 25 U/L (ref 6–29)

## 2020-08-30 NOTE — Progress Notes (Signed)
LFTs are normal now.

## 2020-09-19 NOTE — Progress Notes (Signed)
Office Visit Note  Patient: [name]             Date of Birth: July 16, 1971           MRN: [id number]             PCP: Patient, No Pcp Per Referring: No ref. provider found Visit Date: 10/02/2020 Occupation: @GUAROCC @  Subjective:  Medication monitoring   History of Present Illness: [name] is a 49 y.o. female with history of seropositive rheumatoid arthritis. She is taking methotrexate 7 tablets by mouth once weekly and folic acid 2 mg po daily.  She denies any rheumatoid arthritis flares.  She denies any joint pain or joint swelling at this time.  Her morning stiffness has been lasting about 30 minutes daily.   Patient reports that for the past 6 months+ she has been experiencing twitching of the left eye.  She states she was evaluated at urgent care because she was also experiencing sinus pressure and dizziness.  She was referred to ENT and had a CT which was unremarkable.  She is awaiting an appointment with neurology on 11/13/20.   Activities of Daily Living:  Patient reports morning stiffness for 30 minutes.   Patient Denies nocturnal pain.  Difficulty dressing/grooming: Denies Difficulty climbing stairs: Denies Difficulty getting out of chair: Denies Difficulty using hands for taps, buttons, cutlery, and/or writing: Denies  Review of Systems  Constitutional: Positive for fatigue.  HENT: Negative for mouth sores, mouth dryness and nose dryness.   Eyes: Positive for pain. Negative for visual disturbance and dryness.  Respiratory: Negative for cough, hemoptysis, shortness of breath and difficulty breathing.   Cardiovascular: Negative for chest pain, palpitations, hypertension and swelling in legs/feet.  Gastrointestinal: Negative for blood in stool, constipation and diarrhea.  Endocrine: Negative for increased urination.  Genitourinary: Negative for painful urination.  Musculoskeletal: Positive for morning stiffness. Negative for arthralgias, joint pain,  joint swelling, myalgias, muscle weakness, muscle tenderness and myalgias.  Skin: Negative for color change, pallor, rash, hair loss, nodules/bumps, skin tightness, ulcers and sensitivity to sunlight.  Allergic/Immunologic: Negative for susceptible to infections.  Neurological: Positive for dizziness and weakness. Negative for numbness and headaches.  Hematological: Negative for swollen glands.  Psychiatric/Behavioral: Positive for sleep disturbance. Negative for depressed mood. The patient is not nervous/anxious.     PMFS History:  Patient Active Problem List   Diagnosis Date Noted  . Multiple thyroid nodules 12/13/2018  . Hair loss 12/13/2018  . Encounter for IUD insertion 01/26/2018  . 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 Multiple acquired skin tags 07/14/2017  . Rheumatoid arthritis with rheumatoid factor of multiple sites without organ or systems involvement ([place]) 11/09/2016  . High risk medication use 11/09/2016  . Non compliance w medication regimen 11/09/2016  . Constipation 06/16/2016  . Obesity 05/16/2015    Past Medical History:  Diagnosis Date  . Arthritis   . BV (bacterial vaginosis)   . Constipation 06/16/2016  . HSV (herpes simplex virus) infection   . IUD (intrauterine device) in place 03/20/2014   IUD inserted 01/23/13  . Obesity   . Osteoarthritis   . RA (rheumatoid arthritis) ([place])   . Trichomonas     Family History  Problem Relation Age of Onset  . Diabetes Mother   . COPD Mother   . Arthritis Mother   . Hypertension Mother   . Heart disease Mother   . Asthma Son   . Cancer Maternal Grandmother  lung  . Heart disease Paternal Grandmother        CHF   Past Surgical History:  Procedure Laterality Date  . APPENDECTOMY    . CHOLECYSTECTOMY    . HAND SURGERY    . OVARIAN CYST REMOVAL N/A 08/17/2007  . TONSILLECTOMY     Social History   Social History Narrative   Works at a [employer].   Has children.   Does not smoke.   Eats meat fruits  and vegetables.   Wears her seatbelt.   Immunization History  Administered Date(s) Administered  . PFIZER SARS-COV-2 Vaccination 07/02/2020, 07/23/2020     Objective: Vital Signs: BP (!) 149/87 (BP Location: Left Arm, Patient Position: Sitting, Cuff Size: Small)   Pulse 98   Ht 5\' 2"  (1.575 m)   Wt 241 lb (109.3 kg)   BMI 44.08 kg/m    Physical Exam Vitals and nursing note reviewed.  Constitutional:      Appearance: She is well-developed.  HENT:     Head: Normocephalic and atraumatic.  Eyes:     Conjunctiva/sclera: Conjunctivae normal.  Pulmonary:     Effort: Pulmonary effort is normal.  Abdominal:     Palpations: Abdomen is soft.  Musculoskeletal:     Cervical back: Normal range of motion.  Skin:    General: Skin is warm and dry.     Capillary Refill: Capillary refill takes less than 2 seconds.  Neurological:     Mental Status: She is alert and oriented to person, place, and time.  Psychiatric:        Behavior: Behavior normal.      Musculoskeletal Exam: C-spine, thoracic spine, and lumbar spine good ROM.  No midline spinal tenderness.  No SI joint tenderness.  Shoulder joints, elbow joints, wrist joints, Mcps, pIPs, and DIPs good ROM with no synovitis.  Complete fist formation bilaterally.  Hip joints, knee joints, and ankle joints good ROM with no discomfort.  No warmth or effusion of knee joints.  No tenderness or swelling of ankle joints.   CDAI Exam: CDAI Score: 0.2  Patient Global: 1 mm; Provider Global: 1 mm Swollen: 0 ; Tender: 0  Joint Exam 10/02/2020   No joint exam has been documented for this visit   There is currently no information documented on the homunculus. Go to the Rheumatology activity and complete the homunculus joint exam.  Investigation: No additional findings.  Imaging: No results found.  Recent Labs: Lab Results  Component Value Date   WBC 9.6 07/09/2020   HGB 13.7 07/09/2020   PLT 302 07/09/2020   NA 136 07/09/2020   K 4.3  07/09/2020   CL 102 07/09/2020   CO2 30 07/09/2020   GLUCOSE 90 07/09/2020   BUN 11 07/09/2020   CREATININE 0.74 07/09/2020   BILITOT 0.6 07/09/2020   ALKPHOS 75 06/17/2017   AST 18 08/29/2020   ALT 25 08/29/2020   PROT 7.1 07/09/2020   ALBUMIN 4.5 06/17/2017   CALCIUM 9.1 07/09/2020   GFRAA 110 07/09/2020    Speciality Comments: No specialty comments available.  Procedures:  No procedures performed Allergies: Contrast media [iodinated diagnostic agents]   Assessment / Plan:     Visit Diagnoses: Rheumatoid arthritis with rheumatoid factor of multiple sites without organ or systems involvement ([facility]) - +RF, +CCP: She has no joint tenderness or synovitis on exam.  She has not had any recent rheumatoid arthritis flares.  She is clinically doing well on Methotrexate 7 tablets by mouth once weekly  and folic acid 2 mg po daily.  She is tolerating MTX without any side effects and has not missed any doses recently.  She is not experiencing any joint pain or inflammation at this time.  Morning stiffness has been lasting 30 minutes daily.  She has started to exercise on a regular basis.  She will continue taking MTX as prescribed.  She does not need a refill at this time.  She was advised to notify us if she develops increased joint pain or joint swelling.  She will follow up in 5 months.    High risk medication use - Methotrexate 7 tablets by mouth once weekly and folic acid 2 mg by mouth daily. CBC and CMP were updated on 07/09/20.  08/29/20 LFTs WNL. She is due to update CBC and CMP today.  Orders were released. Her next lab work will be due in February and every 3 months.  Standing orders for CBC and CMP are in place.  She has received both covid-19 vaccine doses.  She was advised to notify us if she develops the covid-19 infection in order to receive the monoclonal antibody infusion.  She voiced understanding.  She was advised to hold MTX if she develops signs or symptoms of an infection and to  resume once the infection has completely cleared.   - Plan: COMPLETE METABOLIC PANEL WITH GFR, CBC with Differential/Platelet  Chronic SI joint pain: She has no SI joint pain or tenderness to palpation on exam.    Chronic right shoulder pain - She had a cortisone injection performed on 01/21/2020 which resolved her discomfort.  She has good ROM with no discomfort at this time.   Chronic left shoulder pain: She has good ROM with no discomfort at this time.   Facial twitching: She has been experiencing left eye and facial twitching intermittently for the past 6 months.  She has been unable to identify a trigger.  Fasciculations were noted during the visit today.  Patient ports that she was experiencing sinus pressure, dizziness, and ear pain initially and was evaluated in urgent care.  She was referred to ENT who obtained a CT which was unremarkable according to the patient.  She is awaiting appointment with neurology on 11/13/2020.  The left-sided facial twitching has been bothersome for the patient so we will try to see if they have any urgent appointments available.  In the meantime we discussed avoiding caffeine use, practicing good sleep hygiene, and stress management.  Orders: Orders Placed This Encounter  Procedures  . COMPLETE METABOLIC PANEL WITH GFR  . CBC with Differential/Platelet   No orders of the defined types were placed in this encounter.     Follow-Up Instructions: Return in about 5 months (around 03/02/2021) for Rheumatoid arthritis.   Ofilia Neas, PA-C  Note - This record has been created using Dragon software.  Chart creation errors have been sought, but may not always  have been located. Such creation errors do not reflect on  the standard of medical care.

## 2020-10-02 ENCOUNTER — Encounter: Payer: Self-pay | Admitting: Physician Assistant

## 2020-10-02 ENCOUNTER — Ambulatory Visit (INDEPENDENT_AMBULATORY_CARE_PROVIDER_SITE_OTHER): Payer: No Typology Code available for payment source | Admitting: Physician Assistant

## 2020-10-02 ENCOUNTER — Other Ambulatory Visit: Payer: Self-pay

## 2020-10-02 VITALS — BP 149/87 | HR 98 | Ht 62.0 in | Wt 241.0 lb

## 2020-10-02 DIAGNOSIS — G514 Facial myokymia: Secondary | ICD-10-CM

## 2020-10-02 DIAGNOSIS — M25512 Pain in left shoulder: Secondary | ICD-10-CM

## 2020-10-02 DIAGNOSIS — Z79899 Other long term (current) drug therapy: Secondary | ICD-10-CM | POA: Diagnosis not present

## 2020-10-02 DIAGNOSIS — M0579 Rheumatoid arthritis with rheumatoid factor of multiple sites without organ or systems involvement: Secondary | ICD-10-CM

## 2020-10-02 DIAGNOSIS — M533 Sacrococcygeal disorders, not elsewhere classified: Secondary | ICD-10-CM | POA: Diagnosis not present

## 2020-10-02 DIAGNOSIS — G8929 Other chronic pain: Secondary | ICD-10-CM

## 2020-10-02 DIAGNOSIS — M25511 Pain in right shoulder: Secondary | ICD-10-CM

## 2020-10-02 NOTE — Patient Instructions (Signed)
Standing Labs We placed an order today for your standing lab work.   Please have your standing labs drawn in February and every 3 months    If possible, please have your labs drawn 2 weeks prior to your appointment so that the provider can discuss your results at your appointment.  We have open lab daily Monday through Thursday from 8:30-12:30 PM and 1:30-4:30 PM and Friday from 8:30-12:30 PM and 1:30-4:00 PM at the office of Dr. Shaili Deveshwar, [facility].   Please be advised, patients with office appointments requiring lab work will take precedents over walk-in lab work.  If possible, please come for your lab work on Monday and Friday afternoons, as you may experience shorter wait times. The office is located at [address], [address], [place], [place] [address] No appointment is necessary.   Labs are drawn by [facility]. Please bring your co-pay at the time of your lab draw.  You may receive a bill from [facility] for your lab work.  If you wish to have your labs drawn at another location, please call the office 24 hours in advance to send orders.  If you have any questions regarding directions or hours of operation,  please call [phone].   As a reminder, please drink plenty of water prior to coming for your lab work. Thanks!   

## 2020-10-02 NOTE — Progress Notes (Deleted)
Office Visit Note  Patient: [name]             Date of Birth: 1971/07/10           MRN: [id number]             PCP: Patient, No Pcp Per Referring: No ref. provider found Visit Date: 10/02/2020 Occupation: @GUAROCC @  Subjective:  No chief complaint on file.   History of Present Illness: [name] is a 48 y.o. female ***   Activities of Daily Living:  Patient reports morning stiffness for 30 minutes.   Patient Denies nocturnal pain.  Difficulty dressing/grooming: Denies Difficulty climbing stairs: Denies Difficulty getting out of chair: Denies Difficulty using hands for taps, buttons, cutlery, and/or writing: Denies  Review of Systems  Constitutional: Positive for fatigue.  HENT: Negative for mouth sores, mouth dryness and nose dryness.   Eyes: Positive for pain. Negative for itching, visual disturbance and dryness.  Respiratory: Negative for cough, hemoptysis, shortness of breath and difficulty breathing.   Cardiovascular: Negative for chest pain, palpitations and swelling in legs/feet.  Gastrointestinal: Negative for abdominal pain, blood in stool, constipation and diarrhea.  Endocrine: Negative for increased urination.  Genitourinary: Negative for painful urination.  Musculoskeletal: Positive for morning stiffness. Negative for arthralgias, joint pain, joint swelling, myalgias, muscle weakness, muscle tenderness and myalgias.  Skin: Negative for color change, rash and redness.  Allergic/Immunologic: Negative for susceptible to infections.  Neurological: Positive for dizziness and weakness. Negative for numbness, headaches and memory loss.  Hematological: Negative for swollen glands.  Psychiatric/Behavioral: Positive for sleep disturbance. Negative for confusion.    PMFS History:  Patient Active Problem List   Diagnosis Date Noted  . Multiple thyroid nodules 12/13/2018  . Hair loss 12/13/2018  . Encounter for IUD insertion 01/26/2018  . Chronic SI  joint pain 11/23/2017  . Multiple acquired skin tags 07/14/2017  . Rheumatoid arthritis with rheumatoid factor of multiple sites without organ or systems involvement ([place]) 11/09/2016  . High risk medication use 11/09/2016  . Non compliance w medication regimen 11/09/2016  . Constipation 06/16/2016  . Obesity 05/16/2015    Past Medical History:  Diagnosis Date  . Arthritis   . BV (bacterial vaginosis)   . Constipation 06/16/2016  . HSV (herpes simplex virus) infection   . IUD (intrauterine device) in place 03/20/2014   IUD inserted 01/23/13  . Obesity   . Osteoarthritis   . RA (rheumatoid arthritis) ([place])   . Trichomonas     Family History  Problem Relation Age of Onset  . Diabetes Mother   . COPD Mother   . Arthritis Mother   . Hypertension Mother   . Heart disease Mother   . Asthma Son   . Cancer Maternal Grandmother        lung  . Heart disease Paternal Grandmother        CHF   Past Surgical History:  Procedure Laterality Date  . APPENDECTOMY    . CHOLECYSTECTOMY    . HAND SURGERY    . OVARIAN CYST REMOVAL N/A 08/17/2007  . TONSILLECTOMY     Social History   Social History Narrative   Works at a [employer].   Has children.   Does not smoke.   Eats meat fruits and vegetables.   Wears her seatbelt.   Immunization History  Administered Date(s) Administered  . PFIZER SARS-COV-2 Vaccination 07/02/2020     Objective: Vital Signs: BP (!) 149/87 (BP Location: Left Arm, Patient  Position: Sitting, Cuff Size: Small)   Pulse 98   Ht 5\' 2"  (1.575 m)   Wt 241 lb (109.3 kg)   BMI 44.08 kg/m    Physical Exam   Musculoskeletal Exam: ***  CDAI Exam: CDAI Score: -- Patient Global: --; Provider Global: -- Swollen: --; Tender: -- Joint Exam 10/02/2020   No joint exam has been documented for this visit   There is currently no information documented on the homunculus. Go to the Rheumatology activity and complete the homunculus joint exam.  Investigation: No  additional findings.  Imaging: No results found.  Recent Labs: Lab Results  Component Value Date   WBC 9.6 07/09/2020   HGB 13.7 07/09/2020   PLT 302 07/09/2020   NA 136 07/09/2020   K 4.3 07/09/2020   CL 102 07/09/2020   CO2 30 07/09/2020   GLUCOSE 90 07/09/2020   BUN 11 07/09/2020   CREATININE 0.74 07/09/2020   BILITOT 0.6 07/09/2020   ALKPHOS 75 06/17/2017   AST 18 08/29/2020   ALT 25 08/29/2020   PROT 7.1 07/09/2020   ALBUMIN 4.5 06/17/2017   CALCIUM 9.1 07/09/2020   GFRAA 110 07/09/2020    Speciality Comments: No specialty comments available.  Procedures:  No procedures performed Allergies: Contrast media [iodinated diagnostic agents]   Assessment / Plan:     Visit Diagnoses: Rheumatoid arthritis with rheumatoid factor of multiple sites without organ or systems involvement (HCC) - +RF, +CCP:  High risk medication use - Methotrexate 7 tablets by mouth once weekly and folic acid 2 mg by mouth daily.   Chronic SI joint pain  Chronic right shoulder pain - She had a cortisone injection performed on 01/21/2020 which resolved her discomfort.  Chronic left shoulder pain  Orders: No orders of the defined types were placed in this encounter.  No orders of the defined types were placed in this encounter.   Face-to-face time spent with patient was *** minutes. Greater than 50% of time was spent in counseling and coordination of care.  Follow-Up Instructions: No follow-ups on file.   Rene Kocher, RT  Note - This record has been created using Editor, commissioning.  Chart creation errors have been sought, but may not always  have been located. Such creation errors do not reflect on  the standard of medical care.

## 2020-10-03 LAB — COMPLETE METABOLIC PANEL WITH GFR
AG Ratio: 1.8 (calc) (ref 1.0–2.5)
ALT: 28 U/L (ref 6–29)
AST: 22 U/L (ref 10–35)
Albumin: 4.3 g/dL (ref 3.6–5.1)
Alkaline phosphatase (APISO): 76 U/L (ref 31–125)
BUN: 11 mg/dL (ref 7–25)
CO2: 29 mmol/L (ref 20–32)
Calcium: 9.3 mg/dL (ref 8.6–10.2)
Chloride: 103 mmol/L (ref 98–110)
Creat: 0.77 mg/dL (ref 0.50–1.10)
GFR, Est African American: 105 mL/min/{1.73_m2} (ref >=60)
GFR, Est Non African American: 91 mL/min/{1.73_m2} (ref >=60)
Globulin: 2.4 g/dL (calc) (ref 1.9–3.7)
Glucose, Bld: 81 mg/dL (ref 65–139)
Potassium: 3.8 mmol/L (ref 3.5–5.3)
Sodium: 138 mmol/L (ref 135–146)
Total Bilirubin: 0.5 mg/dL (ref 0.2–1.2)
Total Protein: 6.7 g/dL (ref 6.1–8.1)

## 2020-10-03 LAB — CBC WITH DIFFERENTIAL/PLATELET
Absolute Monocytes: 747 cells/uL (ref 200–950)
Basophils Absolute: 61 cells/uL (ref 0–200)
Basophils Relative: 0.6 %
Eosinophils Absolute: 131 cells/uL (ref 15–500)
Eosinophils Relative: 1.3 %
HCT: 39.5 % (ref 35.0–45.0)
Hemoglobin: 13.2 g/dL (ref 11.7–15.5)
Lymphs Abs: 1677 cells/uL (ref 850–3900)
MCH: 31.3 pg (ref 27.0–33.0)
MCHC: 33.4 g/dL (ref 32.0–36.0)
MCV: 93.6 fL (ref 80.0–100.0)
MPV: 10.5 fL (ref 7.5–12.5)
Monocytes Relative: 7.4 %
Neutro Abs: 7484 cells/uL (ref 1500–7800)
Neutrophils Relative %: 74.1 %
Platelets: 298 10*3/uL (ref 140–400)
RBC: 4.22 10*6/uL (ref 3.80–5.10)
RDW: 12.9 % (ref 11.0–15.0)
Total Lymphocyte: 16.6 %
WBC: 10.1 10*3/uL (ref 3.8–10.8)

## 2020-10-03 NOTE — Progress Notes (Signed)
CBC and CMP WNL

## 2020-11-09 ENCOUNTER — Other Ambulatory Visit: Payer: Self-pay | Admitting: Rheumatology

## 2020-11-13 ENCOUNTER — Encounter: Payer: Self-pay | Admitting: Neurology

## 2020-11-13 ENCOUNTER — Ambulatory Visit: Payer: No Typology Code available for payment source | Admitting: Neurology

## 2020-11-13 VITALS — BP 145/79 | HR 75 | Ht 62.0 in | Wt 242.0 lb

## 2020-11-13 DIAGNOSIS — G43709 Chronic migraine without aura, not intractable, without status migrainosus: Secondary | ICD-10-CM

## 2020-11-13 DIAGNOSIS — R253 Fasciculation: Secondary | ICD-10-CM | POA: Insufficient documentation

## 2020-11-13 MED ORDER — ONDANSETRON 4 MG PO TBDP: 4.0000 mg | ORAL_TABLET | Freq: Three times a day (TID) | ORAL | 6 refills | Status: DC | PRN

## 2020-11-13 MED ORDER — SUMATRIPTAN SUCCINATE 50 MG PO TABS: 50.0000 mg | ORAL_TABLET | ORAL | 6 refills | Status: DC | PRN

## 2020-11-13 NOTE — Progress Notes (Signed)
Chief Complaint  Patient presents with  . New Patient (Initial Visit)    Reports feeling stuffy-headed with twitching in face and eyes. Symptoms have improved since she first made the appt. However, she is still having twitching in her left eye. She also feels her eyelid will close on its own at times.     HISTORICAL  [name] is a 49 year old female, seen in request by her primary care doctor Ebbie Latus for evaluation of facial pain, headaches, occasionally eyelid fasciculation, initial evaluation was on November 13, 2020  I reviewed and summarized the referring note.PMHx Rheumatoid arthritis, 14 years, is under good control  She reported more than 20 years history of intermittent " sinus headache", she described pain behind her eyes, at her face, pressure sensation, previously with prolonged severe pain, even was treated at urgent care,  She only has it intermittently every few weeks or even longer in between episodes  In October 2021, she suffered a prolonged episode, constant pressure behind her eyes, down to her face, light noise sensitivity, worsening by movement, dizziness, she just before dark quiet room resting  She has tried meclizine with limited help, Tylenol did not not help her much, after taking a course of antibiotic, her symptoms has much improved, she was also evaluated by ENT per patient, CT of the sinus showed no significant abnormalities.  Laboratory evaluations in 2021: normal CBC, Hg 13.2, CMP, creat 0.77  REVIEW OF SYSTEMS: Full 14 system review of systems performed and notable only for as above All other review of systems were negative.  ALLERGIES: Allergies  Allergen Reactions  . Contrast Media [Iodinated Diagnostic Agents] Swelling    Swelling of the eyes    HOME MEDICATIONS: Current Outpatient Medications  Medication Sig Dispense Refill  . Biotin w/ Vitamins C & E (HAIR/SKIN/NAILS PO) Take by mouth daily.    . Chromium Picolinate  (CHROMIUM PICOLATE PO) Take by mouth daily.    . Cinnamon 500 MG TABS Take by mouth daily.     . diclofenac Sodium (VOLTAREN) 1 % GEL Apply 2-4 grams to affected joint 4 times daily as needed. 400 g 2  . Flaxseed, Linseed, (FLAX SEED OIL) 1000 MG CAPS Take by mouth daily.     . folic acid (FOLVITE) 1 MG tablet Take 2 tablets (2 mg total) by mouth daily. 180 tablet 3  . levonorgestrel (MIRENA) 20 MCG/24HR IUD 1 each by Intrauterine route once.    Marland Kitchen MAGNESIUM PO Take 800 mg by mouth daily.    . meclizine (ANTIVERT) 25 MG tablet Take 1 tablet (25 mg total) by mouth 3 (three) times daily as needed for dizziness. 20 tablet 0  . methotrexate (RHEUMATREX) 2.5 MG tablet TAKE 7 TABLETS BY MOUTH ONE TIME PER WEEK 84 tablet 0  . Misc Natural Products (TART CHERRY ADVANCED PO) Take by mouth daily.    . Multiple Vitamins-Calcium (ONE-A-DAY WOMENS PO) Take by mouth daily.    . Omega-3 Fatty Acids (FISH OIL PO) Take 2,000 mg by mouth.     No current facility-administered medications for this visit.    PAST MEDICAL HISTORY: Past Medical History:  Diagnosis Date  . Arthritis   . BV (bacterial vaginosis)   . Constipation 06/16/2016  . HSV (herpes simplex virus) infection   . IUD (intrauterine device) in place 03/20/2014   IUD inserted 01/23/13  . Obesity   . Osteoarthritis   . RA (rheumatoid arthritis) ([place])   . Trichomonas   . Twitching  PAST SURGICAL HISTORY: Past Surgical History:  Procedure Laterality Date  . APPENDECTOMY    . CHOLECYSTECTOMY    . HAND SURGERY    . OVARIAN CYST REMOVAL N/A 08/17/2007  . TONSILLECTOMY      FAMILY HISTORY: Family History  Problem Relation Age of Onset  . Diabetes Mother   . COPD Mother   . Arthritis Mother   . Hypertension Mother   . Heart disease Mother   . Congestive Heart Failure Mother   . Hearing loss Father   . Asthma Son   . Cancer Maternal Grandmother        lung  . Heart disease Paternal Grandmother        CHF    SOCIAL  HISTORY: Social History   Socioeconomic History  . Marital status: Single    Spouse name: Not on file  . Number of children: 1  . Years of education: some college  . Highest education level: Not on file  Occupational History  . Occupation: Wellsite geologist  Tobacco Use  . Smoking status: Never Smoker  . Smokeless tobacco: Never Used  Vaping Use  . Vaping Use: Never used  Substance and Sexual Activity  . Alcohol use: Yes    Comment: 1 or 2 times per year  . Drug use: Never  . Sexual activity: Not on file  Other Topics Concern  . Not on file  Social History Narrative   Works at a [employer].   Has children.   Does not smoke.   Eats meat fruits and vegetables.   Wears her seatbelt.      Lives at home with her son.   Right-handed.   One cup caffeine, three times per week.   Social Determinants of Health   Financial Resource Strain: Not on file  Food Insecurity: Not on file  Transportation Needs: Not on file  Physical Activity: Not on file  Stress: Not on file  Social Connections: Not on file  Intimate Partner Violence: Not on file     PHYSICAL EXAM   Vitals:   11/13/20 0840  BP: (!) 145/79  Pulse: 75  Weight: 242 lb (109.8 kg)  Height: 5\' 2"  (1.575 m)   Not recorded     Body mass index is 44.26 kg/m.  PHYSICAL EXAMNIATION:  Gen: NAD, conversant, well nourised, well groomed                     Cardiovascular: Regular rate rhythm, no peripheral edema, warm, nontender. Eyes: Conjunctivae clear without exudates or hemorrhage Neck: Supple, no carotid bruits. Pulmonary: Clear to auscultation bilaterally   NEUROLOGICAL EXAM:  MENTAL STATUS: Speech:    Speech is normal; fluent and spontaneous with normal comprehension.  Cognition:     Orientation to time, place and person     Normal recent and remote memory     Normal Attention span and concentration     Normal Language, naming, repeating,spontaneous speech     Fund of knowledge   CRANIAL  NERVES: CN II: Visual fields are full to confrontation. Pupils are round equal and briskly reactive to light. CN III, IV, VI: extraocular movement are normal. No ptosis. CN V: Facial sensation is intact to light touch CN VII: Face is symmetric with normal eye closure  CN VIII: Hearing is normal to causal conversation. CN IX, X: Phonation is normal. CN XI: Head turning and shoulder shrug are intact  MOTOR: There is no pronator drift of out-stretched arms. Muscle  bulk and tone are normal. Muscle strength is normal.  REFLEXES: Reflexes are 2+ and symmetric at the biceps, triceps, knees, and ankles. Plantar responses are flexor.  SENSORY: Intact to light touch, pinprick and vibratory sensation are intact in fingers and toes.  COORDINATION: There is no trunk or limb dysmetria noted.  GAIT/STANCE: Posture is normal. Gait is steady with normal steps, base, arm swing, and turning. Heel and toe walking are normal. Tandem gait is normal.  Romberg is absent.   DIAGNOSTIC DATA (LABS, IMAGING, TESTING) - I reviewed patient records, labs, notes, testing and imaging myself where available.   ASSESSMENT AND PLAN  JORIE BETZ is a 49 y.o. female   Chronic migraine  Maxalt 50 mg as needed, may take Zofran as needed for nausea  Right eye muscle fasciculations  I was able to see 1 video clip of patient, has frequent right lower eyelid muscle fasciculations, if she complains more muscle fasciculations, may consider start gabapentin as preventive medications   Marcial Pacas, M.D. Ph.D.  [facility] [address], [place], [place] [address] Ph: [phone] Fax: [phone]  CC:  Jason Coop, [place],  [place] [address]  [place], [facility]

## 2021-01-13 NOTE — Progress Notes (Signed)
Office Visit Note  Patient: [name]             Date of Birth: 14-Jun-1971           MRN: [id number]             PCP: [place] Referring: [facility]* Visit Date: 01/14/2021 Occupation: @GUAROCC @  Subjective:  Other (Left shoulder pain- patient denies injury or fall )   History of Present Illness: [name] is a 50 y.o. female with history of rheumatoid arthritis.  She states that she came back from work and her left shoulder joint was hurting.  She states yesterday the pain was severe and she took 2 tablets of Naprosyn and ice to all day.  She states this morning the pain has eased off to some extent.  Yesterday, she continued left arm and today she is able to.  None of the other joints are painful.  She has not missed any doses of methotrexate.  She denies any history of injury.  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  She did not lift anything heavy.  Activities of Daily Living:  Patient reports morning stiffness for 5 minutes.   Patient Denies nocturnal pain.  Difficulty dressing/grooming: Denies Difficulty climbing stairs: Denies Difficulty getting out of chair: Denies Difficulty using hands for taps, buttons, cutlery, and/or writing: Denies  Review of Systems  Constitutional: Positive for fatigue.  HENT: Negative for mouth sores, mouth dryness and nose dryness.   Eyes: Negative for pain, itching and dryness.  Respiratory: Negative for shortness of breath and difficulty breathing.   Cardiovascular: Negative for chest pain and palpitations.  Gastrointestinal: Negative for blood in stool, constipation and diarrhea.  Endocrine: Negative for increased urination.  Genitourinary: Negative for difficulty urinating.  Musculoskeletal: Positive for arthralgias, joint pain, joint swelling and morning stiffness. Negative for myalgias, muscle tenderness and myalgias.  Skin: Negative for color change, rash and redness.   Allergic/Immunologic: Negative for susceptible to infections.  Neurological: Positive for numbness and headaches. Negative for dizziness, memory loss and weakness.  Hematological: Negative for bruising/bleeding tendency.  Psychiatric/Behavioral: Negative for confusion.    PMFS History:  Patient Active Problem List   Diagnosis Date Noted  . Chronic migraine w/o aura w/o status migrainosus, not intractable 11/13/2020  . Fasciculation 11/13/2020  . Multiple thyroid nodules 12/13/2018  . Hair loss 12/13/2018  . Encounter for IUD insertion 01/26/2018  . 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 Multiple acquired skin tags 07/14/2017  . Rheumatoid arthritis with rheumatoid factor of multiple sites without organ or systems involvement ([place]) 11/09/2016  . High risk medication use 11/09/2016  . Non compliance w medication regimen 11/09/2016  . Constipation 06/16/2016  . Obesity 05/16/2015    Past Medical History:  Diagnosis Date  . Arthritis   . BV (bacterial vaginosis)   . Constipation 06/16/2016  . HSV (herpes simplex virus) infection   . IUD (intrauterine device) in place 03/20/2014   IUD inserted 01/23/13  . Obesity   . Osteoarthritis   . RA (rheumatoid arthritis) ([place])   . Trichomonas   . Twitching     Family History  Problem Relation Age of Onset  . Diabetes Mother   . COPD Mother   . Arthritis Mother   . Hypertension Mother   . Heart disease Mother   . Congestive Heart Failure Mother   . Hearing loss Father   . Asthma Son   . Cancer  Maternal Grandmother        lung  . Heart disease Paternal Grandmother        CHF   Past Surgical History:  Procedure Laterality Date  . APPENDECTOMY    . CHOLECYSTECTOMY    . HAND SURGERY    . OVARIAN CYST REMOVAL N/A 08/17/2007  . TONSILLECTOMY     Social History   Social History Narrative   Works at a [employer].   Has children.   Does not smoke.   Eats meat fruits and vegetables.   Wears her seatbelt.      Lives at home with  her son.   Right-handed.   One cup caffeine, three times per week.   Immunization History  Administered Date(s) Administered  . PFIZER(Purple Top)SARS-COV-2 Vaccination 07/02/2020, 07/23/2020     Objective: Vital Signs: BP (!) 146/85 (BP Location: Right Arm, Patient Position: Sitting, Cuff Size: Normal)   Pulse 85   Resp 14   Ht 5\' 2"  (1.575 m)   Wt 243 lb 9.6 oz (110.5 kg)   BMI 44.56 kg/m    Physical Exam Vitals and nursing note reviewed.  Constitutional:      Appearance: She is well-developed and well-nourished.  HENT:     Head: Normocephalic and atraumatic.  Eyes:     Extraocular Movements: EOM normal.     Conjunctiva/sclera: Conjunctivae normal.  Cardiovascular:     Rate and Rhythm: Normal rate and regular rhythm.     Pulses: Intact distal pulses.     Heart sounds: Normal heart sounds.  Pulmonary:     Effort: Pulmonary effort is normal.     Breath sounds: Normal breath sounds.  Abdominal:     General: Bowel sounds are normal.     Palpations: Abdomen is soft.  Musculoskeletal:     Cervical back: Normal range of motion.  Lymphadenopathy:     Cervical: No cervical adenopathy.  Skin:    General: Skin is warm and dry.     Capillary Refill: Capillary refill takes less than 2 seconds.  Neurological:     Mental Status: She is alert and oriented to person, place, and time.  Psychiatric:        Mood and Affect: Mood and affect normal.        Behavior: Behavior normal.      Musculoskeletal Exam: C-spine was in good range of motion.  Right shoulder joint was in good range of motion.  She had discomfort with the external rotation and abduction of her right shoulder joint.  She had tenderness mostly over the anterior aspect of her shoulder.  Elbow joints, wrist joints, MCPs PIPs and DIPs with good range of motion with no synovitis.  Hip joints, knee joints, ankles, MTPs and PIPs with good range of motion with no synovitis.  CDAI Exam: CDAI Score: 1.2  Patient Global: 1  mm; Provider Global: 1 mm Swollen: 0 ; Tender: 1  Joint Exam 01/14/2021      Right  Left  Glenohumeral      Tender     Investigation: No additional findings.  Imaging: XR Foot 2 Views Left  Result Date: 01/14/2021 PIP and DIP narrowing was noted. No MTP, intertarsal, tibiotalar or subtalar joint space narrowing was noted. No erosive changes were noted. Posterior calcaneal spur was noted. Impression: These findings are consistent with osteoarthritis of the foot.  XR Foot 2 Views Right  Result Date: 01/14/2021 PIP and DIP narrowing was noted. No MTP, intertarsal, tibiotalar or subtalar  joint space narrowing was noted. No erosive changes were noted. Impression: These findings are consistent with osteoarthritis of the foot.  XR Hand 2 View Left  Result Date: 01/14/2021 Juxta-articular osteopenia was noted. No MCP, intercarpal or radiocarpal joint space narrowing was noted. CMC, PIP and DIP narrowing was noted. No erosive changes were noted. Impression: These findings are consistent with rheumatoid arthritis and osteoarthritis overlap.  XR Hand 2 View Right  Result Date: 01/14/2021 Juxta-articular osteopenia was noted. No MCP, intercarpal or radiocarpal joint space narrowing was noted. PIP and DIP narrowing was noted. No erosive changes were noted. Impression: These findings are consistent with rheumatoid arthritis and osteoarthritis overlap.   Recent Labs: Lab Results  Component Value Date   WBC 10.1 10/02/2020   HGB 13.2 10/02/2020   PLT 298 10/02/2020   NA 138 10/02/2020   K 3.8 10/02/2020   CL 103 10/02/2020   CO2 29 10/02/2020   GLUCOSE 81 10/02/2020   BUN 11 10/02/2020   CREATININE 0.77 10/02/2020   BILITOT 0.5 10/02/2020   ALKPHOS 75 06/17/2017   AST 22 10/02/2020   ALT 28 10/02/2020   PROT 6.7 10/02/2020   ALBUMIN 4.5 06/17/2017   CALCIUM 9.3 10/02/2020   GFRAA 105 10/02/2020    Speciality Comments: No specialty comments available.  Procedures:  No  procedures performed Allergies: Contrast media [iodinated diagnostic agents]   Assessment / Plan:     Visit Diagnoses: Rheumatoid arthritis with rheumatoid factor of multiple sites without organ or systems involvement ([place]) - +RF, +CCP:  -Patient had no synovitis on examination.  She has not complained of discomfort in her hands or feet.  I will obtain x-rays today to monitor for any radiographic progression.  Plan: XR Hand 2 View Right, XR Hand 2 View Left, XR Foot 2 Views Right, XR Foot 2 Views Left. X-rays showed osteoarthritic changes. Hand x-ray showed mild juxta-articular osteopenia. No erosive changes were noted.  High risk medication use - Methotrexate 7 tablets by mouth once weekly and folic acid 2 mg by mouth daily. - Plan: CBC with Differential/Platelet, COMPLETE METABOLIC PANEL WITH GFR today and then every 3 months to monitor for drug toxicity.  Acute pain of left shoulder-she had issues with off and on discomfort in her left shoulder joint.  The last flare was August 2021.  She states is responded to NSAIDs and steroids but did not have much relief from the cortisone injection.  Per her request I will give her a prednisone taper starting at 20 mg and taper by 5 mg every 2 days.  Side effects of the prednisone use were discussed. I have advised to continue with the shoulder joint exercises. If her symptoms do not improve I will refer her to physical therapy.  Chronic SI joint pain-improved.  Chronic right shoulder pain - She had a cortisone injection performed on 01/21/2020 which resolved her discomfort.  Facial twitching - was evaluated by Dr. Krista Blue on 11/13/2020  Educated about COVID-19 virus infection-she has had 2 initial COVID-19 vaccines.  She has been advised to get 3 doses 1 month apart and then 40 J 6 months later.  Use of mask, social distancing and hand hygiene was discussed.  BMI 40.0-44.9, adult (HCC)-association of heart disease with osteoarthritis was discussed.  Weight loss  diet and exercise was discussed.  Information was placed in the AVS.  Orders: Orders Placed This Encounter  Procedures  . XR Hand 2 View Right  . XR Hand 2 View Left  .  XR Foot 2 Views Right  . XR Foot 2 Views Left  . CBC with Differential/Platelet  . COMPLETE METABOLIC PANEL WITH GFR   Meds ordered this encounter  Medications  . predniSONE (DELTASONE) 5 MG tablet    Sig: Take 4 tabs po qd x 2 days, 3  tabs po qd x 2 days, 2  tabs po qd x 2 days, 1  tab po qd x 2 days    Dispense:  20 tablet    Refill:  0      Follow-Up Instructions: Return in about 5 months (around 06/13/2021) for Rheumatoid arthritis.   Bo Merino, MD  Note - This record has been created using Editor, commissioning.  Chart creation errors have been sought, but may not always  have been located. Such creation errors do not reflect on  the standard of medical care.

## 2021-01-14 ENCOUNTER — Ambulatory Visit: Payer: Self-pay

## 2021-01-14 ENCOUNTER — Ambulatory Visit: Payer: No Typology Code available for payment source | Admitting: Rheumatology

## 2021-01-14 ENCOUNTER — Encounter: Payer: Self-pay | Admitting: Rheumatology

## 2021-01-14 ENCOUNTER — Other Ambulatory Visit: Payer: Self-pay

## 2021-01-14 ENCOUNTER — Encounter: Payer: Self-pay | Admitting: *Deleted

## 2021-01-14 VITALS — BP 146/85 | HR 85 | Resp 14 | Ht 62.0 in | Wt 243.6 lb

## 2021-01-14 DIAGNOSIS — G514 Facial myokymia: Secondary | ICD-10-CM

## 2021-01-14 DIAGNOSIS — Z79899 Other long term (current) drug therapy: Secondary | ICD-10-CM | POA: Diagnosis not present

## 2021-01-14 DIAGNOSIS — M0579 Rheumatoid arthritis with rheumatoid factor of multiple sites without organ or systems involvement: Secondary | ICD-10-CM | POA: Diagnosis not present

## 2021-01-14 DIAGNOSIS — M79672 Pain in left foot: Secondary | ICD-10-CM

## 2021-01-14 DIAGNOSIS — M79641 Pain in right hand: Secondary | ICD-10-CM

## 2021-01-14 DIAGNOSIS — Z7189 Other specified counseling: Secondary | ICD-10-CM

## 2021-01-14 DIAGNOSIS — M79671 Pain in right foot: Secondary | ICD-10-CM | POA: Diagnosis not present

## 2021-01-14 DIAGNOSIS — M79642 Pain in left hand: Secondary | ICD-10-CM

## 2021-01-14 DIAGNOSIS — M533 Sacrococcygeal disorders, not elsewhere classified: Secondary | ICD-10-CM

## 2021-01-14 DIAGNOSIS — M25511 Pain in right shoulder: Secondary | ICD-10-CM

## 2021-01-14 DIAGNOSIS — M25512 Pain in left shoulder: Secondary | ICD-10-CM

## 2021-01-14 DIAGNOSIS — Z6841 Body Mass Index (BMI) 40.0 and over, adult: Secondary | ICD-10-CM

## 2021-01-14 DIAGNOSIS — G8929 Other chronic pain: Secondary | ICD-10-CM

## 2021-01-14 MED ORDER — PREDNISONE 5 MG PO TABS: ORAL_TABLET | ORAL | 0 refills | Status: DC

## 2021-01-14 NOTE — Patient Instructions (Addendum)
Shoulder Exercises Ask your health care provider which exercises are safe for you. Do exercises exactly as told by your health care provider and adjust them as directed. It is normal to feel mild stretching, pulling, tightness, or discomfort as you do these exercises. Stop right away if you feel sudden pain or your pain gets worse. Do not begin these exercises until told by your health care provider. Stretching exercises External rotation and abduction This exercise is sometimes called corner stretch. This exercise rotates your arm outward (external rotation) and moves your arm out from your body (abduction). 1. Stand in a doorway with one of your feet slightly in front of the other. This is called a staggered stance. If you cannot reach your forearms to the door frame, stand facing a corner of a room. 2. Choose one of the following positions as told by your health care provider: ? Place your hands and forearms on the door frame above your head. ? Place your hands and forearms on the door frame at the height of your head. ? Place your hands on the door frame at the height of your elbows. 3. Slowly move your weight onto your front foot until you feel a stretch across your chest and in the front of your shoulders. Keep your head and chest upright and keep your abdominal muscles tight. 4. Hold for __________ seconds. 5. To release the stretch, shift your weight to your back foot. Repeat __________ times. Complete this exercise __________ times a day.   Extension, standing 1. Stand and hold a broomstick, a cane, or a similar object behind your back. ? Your hands should be a little wider than shoulder width apart. ? Your palms should face away from your back. 2. Keeping your elbows straight and your shoulder muscles relaxed, move the stick away from your body until you feel a stretch in your shoulders (extension). ? Avoid shrugging your shoulders while you move the stick. Keep your shoulder blades  tucked down toward the middle of your back. 3. Hold for __________ seconds. 4. Slowly return to the starting position. Repeat __________ times. Complete this exercise __________ times a day. Range-of-motion exercises Pendulum 1. Stand near a wall or a surface that you can hold onto for balance. 2. Bend at the waist and let your left / right arm hang straight down. Use your other arm to support you. Keep your back straight and do not lock your knees. 3. Relax your left / right arm and shoulder muscles, and move your hips and your trunk so your left / right arm swings freely. Your arm should swing because of the motion of your body, not because you are using your arm or shoulder muscles. 4. Keep moving your hips and trunk so your arm swings in the following directions, as told by your health care provider: ? Side to side. ? Forward and backward. ? In clockwise and counterclockwise circles. 5. Continue each motion for __________ seconds, or for as long as told by your health care provider. 6. Slowly return to the starting position. Repeat __________ times. Complete this exercise __________ times a day.   Shoulder flexion, standing 1. Stand and hold a broomstick, a cane, or a similar object. Place your hands a little more than shoulder width apart on the object. Your left / right hand should be palm up, and your other hand should be palm down. 2. Keep your elbow straight and your shoulder muscles relaxed. Push the stick up with your healthy   arm to raise your left / right arm in front of your body, and then over your head until you feel a stretch in your shoulder (flexion). ? Avoid shrugging your shoulder while you raise your arm. Keep your shoulder blade tucked down toward the middle of your back. 3. Hold for __________ seconds. 4. Slowly return to the starting position. Repeat __________ times. Complete this exercise __________ times a day.   Shoulder abduction, standing 1. Stand and hold a  broomstick, a cane, or a similar object. Place your hands a little more than shoulder width apart on the object. Your left / right hand should be palm up, and your other hand should be palm down. 2. Keep your elbow straight and your shoulder muscles relaxed. Push the object across your body toward your left / right side. Raise your left / right arm to the side of your body (abduction) until you feel a stretch in your shoulder. ? Do not raise your arm above shoulder height unless your health care provider tells you to do that. ? If directed, raise your arm over your head. ? Avoid shrugging your shoulder while you raise your arm. Keep your shoulder blade tucked down toward the middle of your back. 3. Hold for __________ seconds. 4. Slowly return to the starting position. Repeat __________ times. Complete this exercise __________ times a day. Internal rotation 1. Place your left / right hand behind your back, palm up. 2. Use your other hand to dangle an exercise band, a towel, or a similar object over your shoulder. Grasp the band with your left / right hand so you are holding on to both ends. 3. Gently pull up on the band until you feel a stretch in the front of your left / right shoulder. The movement of your arm toward the center of your body is called internal rotation. ? Avoid shrugging your shoulder while you raise your arm. Keep your shoulder blade tucked down toward the middle of your back. 4. Hold for __________ seconds. 5. Release the stretch by letting go of the band and lowering your hands. Repeat __________ times. Complete this exercise __________ times a day.   Strengthening exercises External rotation 1. Sit in a stable chair without armrests. 2. Secure an exercise band to a stable object at elbow height on your left / right side. 3. Place a soft object, such as a folded towel or a small pillow, between your left / right upper arm and your body to move your elbow about 4 inches (10  cm) away from your side. 4. Hold the end of the exercise band so it is tight and there is no slack. 5. Keeping your elbow pressed against the soft object, slowly move your forearm out, away from your abdomen (external rotation). Keep your body steady so only your forearm moves. 6. Hold for __________ seconds. 7. Slowly return to the starting position. Repeat __________ times. Complete this exercise __________ times a day.   Shoulder abduction 1. Sit in a stable chair without armrests, or stand up. 2. Hold a __________ weight in your left / right hand, or hold an exercise band with both hands. 3. Start with your arms straight down and your left / right palm facing in, toward your body. 4. Slowly lift your left / right hand out to your side (abduction). Do not lift your hand above shoulder height unless your health care provider tells you that this is safe. ? Keep your arms straight. ? Avoid   shrugging your shoulder while you do this movement. Keep your shoulder blade tucked down toward the middle of your back. 5. Hold for __________ seconds. 6. Slowly lower your arm, and return to the starting position. Repeat __________ times. Complete this exercise __________ times a day.   Shoulder extension 1. Sit in a stable chair without armrests, or stand up. 2. Secure an exercise band to a stable object in front of you so it is at shoulder height. 3. Hold one end of the exercise band in each hand. Your palms should face each other. 4. Straighten your elbows and lift your hands up to shoulder height. 5. Step back, away from the secured end of the exercise band, until the band is tight and there is no slack. 6. Squeeze your shoulder blades together as you pull your hands down to the sides of your thighs (extension). Stop when your hands are straight down by your sides. Do not let your hands go behind your body. 7. Hold for __________ seconds. 8. Slowly return to the starting position. Repeat __________  times. Complete this exercise __________ times a day. Shoulder row 1. Sit in a stable chair without armrests, or stand up. 2. Secure an exercise band to a stable object in front of you so it is at waist height. 3. Hold one end of the exercise band in each hand. Position your palms so that your thumbs are facing the ceiling (neutral position). 4. Bend each of your elbows to a 90-degree angle (right angle) and keep your upper arms at your sides. 5. Step back until the band is tight and there is no slack. 6. Slowly pull your elbows back behind you. 7. Hold for __________ seconds. 8. Slowly return to the starting position. Repeat __________ times. Complete this exercise __________ times a day. Shoulder press-ups 1. Sit in a stable chair that has armrests. Sit upright, with your feet flat on the floor. 2. Put your hands on the armrests so your elbows are bent and your fingers are pointing forward. Your hands should be about even with the sides of your body. 3. Push down on the armrests and use your arms to lift yourself off the chair. Straighten your elbows and lift yourself up as much as you comfortably can. ? Move your shoulder blades down, and avoid letting your shoulders move up toward your ears. ? Keep your feet on the ground. As you get stronger, your feet should support less of your body weight as you lift yourself up. 4. Hold for __________ seconds. 5. Slowly lower yourself back into the chair. Repeat __________ times. Complete this exercise __________ times a day.   Wall push-ups 1. Stand so you are facing a stable wall. Your feet should be about one arm-length away from the wall. 2. Lean forward and place your palms on the wall at shoulder height. 3. Keep your feet flat on the floor as you bend your elbows and lean forward toward the wall. 4. Hold for __________ seconds. 5. Straighten your elbows to push yourself back to the starting position. Repeat __________ times. Complete this  exercise __________ times a day.   This information is not intended to replace advice given to you by your health care provider. Make sure you discuss any questions you have with your health care provider. Document Revised: 03/02/2019 Document Reviewed: 12/08/2018 Elsevier Patient Education  2021 [place] We placed an order today for your standing lab work.   Please have your  standing labs drawn in May and every 3 months  If possible, please have your labs drawn 2 weeks prior to your appointment so that the provider can discuss your results at your appointment.  We have open lab daily Monday through Thursday from 1:30-4:30 PM and Friday from 1:30-4:00 PM at the office of Dr. Bo Merino, [facility].   Please be advised, all patients with office appointments requiring lab work will take precedents over walk-in lab work.  If possible, please come for your lab work on Monday and Friday afternoons, as you may experience shorter wait times. The office is located at [address] [place], [place], [place] [address] No appointment is necessary.   Labs are drawn by [facility]. Please bring your co-pay at the time of your lab draw.  You may receive a bill from [facility] for your lab work.  If you wish to have your labs drawn at another location, please call the office 24 hours in advance to send orders.  If you have any questions regarding directions or hours of operation,  please call [phone].   As a reminder, please drink plenty of water prior to coming for your lab work. Thanks!  COVID-19 vaccine recommendations:   COVID-19 vaccine is recommended for everyone (unless you are allergic to a vaccine component), even if you are on a medication that suppresses your immune system.   If you are on Methotrexate, Cellcept (mycophenolate), Rinvoq, Morrie Sheldon, and Olumiant- hold the medication for 1 week after each vaccine. Hold Methotrexate for 2 weeks after the  single dose COVID-19 vaccine.   It is recommended that patients immunosuppressive therapy should receive COVID-19 vaccination 3 doses 1 month apart and fourth dose (booster) 6 months after the third dose.  Do not take Tylenol or any anti-inflammatory medications (NSAIDs) 24 hours prior to the COVID-19 vaccination.   There is no direct evidence about the efficacy of the COVID-19 vaccine in individuals who are on medications that suppress the immune system.   Even if you are fully vaccinated, and you are on any medications that suppress your immune system, please continue to wear a mask, maintain at least six feet social distance and practice hand hygiene.   If you develop a COVID-19 infection, please contact your PCP or our office to determine if you need monoclonal antibody infusion.  The booster vaccine is now available for immunocompromised patients.   Please see the following web sites for updated information.   [email or web address]-Resources.[email or web address]  Heart Disease Prevention   Your inflammatory disease increases your risk of heart disease which includes heart attack, stroke, atrial fibrillation (irregular heartbeats), high blood pressure, heart failure and atherosclerosis (plaque in the arteries).  It is important to reduce your risk by:   . Keep blood pressure, cholesterol, and blood sugar at healthy levels   . Smoking Cessation   . Maintain a healthy weight  o BMI 20-25   . Eat a healthy diet  o Plenty of fresh fruit, vegetables, and whole grains  o Limit saturated fats, foods high in sodium, and added sugars  o DASH and Mediterranean diet   . Increase physical activity  o Recommend moderate physically activity for 150 minutes per week/ 30 minutes a day for five days a week These can be broken up into three separate ten-minute sessions during the day.   . Reduce Stress  . Meditation, slow breathing exercises, yoga, coloring  books  . Dental visits twice a year

## 2021-01-15 LAB — CBC WITH DIFFERENTIAL/PLATELET
Absolute Monocytes: 639 cells/uL (ref 200–950)
Basophils Absolute: 56 cells/uL (ref 0–200)
Basophils Relative: 0.6 %
Eosinophils Absolute: 188 cells/uL (ref 15–500)
Eosinophils Relative: 2 %
HCT: 40.3 % (ref 35.0–45.0)
Hemoglobin: 13.5 g/dL (ref 11.7–15.5)
Lymphs Abs: 1664 cells/uL (ref 850–3900)
MCH: 31.8 pg (ref 27.0–33.0)
MCHC: 33.5 g/dL (ref 32.0–36.0)
MCV: 95 fL (ref 80.0–100.0)
MPV: 10.3 fL (ref 7.5–12.5)
Monocytes Relative: 6.8 %
Neutro Abs: 6853 cells/uL (ref 1500–7800)
Neutrophils Relative %: 72.9 %
Platelets: 291 10*3/uL (ref 140–400)
RBC: 4.24 10*6/uL (ref 3.80–5.10)
RDW: 13 % (ref 11.0–15.0)
Total Lymphocyte: 17.7 %
WBC: 9.4 10*3/uL (ref 3.8–10.8)

## 2021-01-15 LAB — COMPLETE METABOLIC PANEL WITH GFR
AG Ratio: 1.5 (calc) (ref 1.0–2.5)
ALT: 24 U/L (ref 6–29)
AST: 18 U/L (ref 10–35)
Albumin: 4.1 g/dL (ref 3.6–5.1)
Alkaline phosphatase (APISO): 83 U/L (ref 31–125)
BUN: 11 mg/dL (ref 7–25)
CO2: 29 mmol/L (ref 20–32)
Calcium: 9.3 mg/dL (ref 8.6–10.2)
Chloride: 104 mmol/L (ref 98–110)
Creat: 0.61 mg/dL (ref 0.50–1.10)
GFR, Est African American: 123 mL/min/{1.73_m2} (ref >=60)
GFR, Est Non African American: 106 mL/min/{1.73_m2} (ref >=60)
Globulin: 2.8 g/dL (calc) (ref 1.9–3.7)
Glucose, Bld: 76 mg/dL (ref 65–99)
Potassium: 4 mmol/L (ref 3.5–5.3)
Sodium: 139 mmol/L (ref 135–146)
Total Bilirubin: 0.3 mg/dL (ref 0.2–1.2)
Total Protein: 6.9 g/dL (ref 6.1–8.1)

## 2021-01-15 NOTE — Progress Notes (Signed)
CBC and CMP normal

## 2021-01-16 ENCOUNTER — Other Ambulatory Visit: Payer: Self-pay | Admitting: Rheumatology

## 2021-01-16 NOTE — Telephone Encounter (Signed)
Last Visit: 01/14/2021 Next Visit: 06/10/2021  Current Dose per office note on 01/14/2021: not discussed   Last Fill: 01/02/2020  Okay to refill per Dr. Estanislado Pandy

## 2021-01-20 ENCOUNTER — Other Ambulatory Visit: Payer: Self-pay | Admitting: Rheumatology

## 2021-02-04 ENCOUNTER — Other Ambulatory Visit (HOSPITAL_COMMUNITY): Payer: Self-pay | Admitting: Physician Assistant

## 2021-02-05 ENCOUNTER — Other Ambulatory Visit (HOSPITAL_COMMUNITY): Payer: Self-pay | Admitting: Adult Health

## 2021-02-05 ENCOUNTER — Other Ambulatory Visit: Payer: Self-pay | Admitting: Physician Assistant

## 2021-02-05 ENCOUNTER — Other Ambulatory Visit (HOSPITAL_COMMUNITY): Payer: Self-pay | Admitting: Physician Assistant

## 2021-02-05 DIAGNOSIS — D179 Benign lipomatous neoplasm, unspecified: Secondary | ICD-10-CM

## 2021-02-05 DIAGNOSIS — Z1231 Encounter for screening mammogram for malignant neoplasm of breast: Secondary | ICD-10-CM

## 2021-02-13 ENCOUNTER — Ambulatory Visit (HOSPITAL_COMMUNITY)
Admission: RE | Admit: 2021-02-13 | Discharge: 2021-02-13 | Disposition: A | Discharge status: Home / Self Care | Payer: No Typology Code available for payment source | Source: Ambulatory Visit | Attending: Adult Health | Admitting: Adult Health

## 2021-02-13 ENCOUNTER — Ambulatory Visit (HOSPITAL_COMMUNITY)
Admission: RE | Admit: 2021-02-13 | Discharge: 2021-02-13 | Disposition: A | Discharge status: Home / Self Care | Payer: No Typology Code available for payment source | Source: Ambulatory Visit | Attending: Physician Assistant | Admitting: Physician Assistant

## 2021-02-13 ENCOUNTER — Other Ambulatory Visit: Payer: Self-pay

## 2021-02-13 DIAGNOSIS — Z1231 Encounter for screening mammogram for malignant neoplasm of breast: Secondary | ICD-10-CM

## 2021-02-13 DIAGNOSIS — D179 Benign lipomatous neoplasm, unspecified: Secondary | ICD-10-CM | POA: Diagnosis not present

## 2021-02-26 ENCOUNTER — Ambulatory Visit: Payer: No Typology Code available for payment source | Admitting: Rheumatology

## 2021-03-19 ENCOUNTER — Ambulatory Visit: Payer: No Typology Code available for payment source | Admitting: Neurology

## 2021-03-21 ENCOUNTER — Other Ambulatory Visit: Payer: Self-pay | Admitting: Rheumatology

## 2021-03-23 NOTE — Telephone Encounter (Signed)
Next Visit: 06/10/2021  Last Visit: 01/14/2021  Last Fill: 11/09/2020  DX:  Rheumatoid arthritis with rheumatoid factor of multiple sites without organ or systems involvement   Current Dose per office note 01/14/2021, - Methotrexate 7 tablets by mouth once weekly   Labs: 01/14/2021, CBC and CMP normal.  Okay to refill MTX?

## 2021-05-03 ENCOUNTER — Other Ambulatory Visit: Payer: Self-pay | Admitting: Physician Assistant

## 2021-05-04 NOTE — Telephone Encounter (Signed)
Next Visit: 06/10/2021   Last Visit: 01/14/2021   Last Fill: 05/01/2020  Current Dose per office note 7/47/1595, folic acid 2 mg by mouth daily  Okay to refill per Dr. Estanislado Pandy

## 2021-05-27 NOTE — Progress Notes (Deleted)
Office Visit Note  Patient: [name]             Date of Birth: [birth year]           MRN: [id number]             PCP: [facility] Referring: [facility] Visit Date: 06/10/2021 Occupation: @GUAROCC @  Subjective:  No chief complaint on file.   History of Present Illness: [name] is a 50 y.o. female ***   Activities of Daily Living:  Patient reports morning stiffness for *** {minute/hour:19697}.   Patient {ACTIONS;DENIES/REPORTS:21021675::"Denies"} nocturnal pain.  Difficulty dressing/grooming: {ACTIONS;DENIES/REPORTS:21021675::"Denies"} Difficulty climbing stairs: {ACTIONS;DENIES/REPORTS:21021675::"Denies"} Difficulty getting out of chair: {ACTIONS;DENIES/REPORTS:21021675::"Denies"} Difficulty using hands for taps, buttons, cutlery, and/or writing: {ACTIONS;DENIES/REPORTS:21021675::"Denies"}  No Rheumatology ROS completed.   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   Multiple acquired skin tags 07/14/2017   Rheumatoid arthritis with rheumatoid factor of multiple sites without organ or systems involvement ([place]) 11/09/2016   High risk medication use 11/09/2016   Non compliance w medication regimen 11/09/2016   Constipation 06/16/2016   Obesity 05/16/2015    Past Medical History:  Diagnosis Date   Arthritis    BV (bacterial vaginosis)    Constipation 06/16/2016   HSV (herpes simplex virus) infection    IUD (intrauterine device) in place 03/20/2014   IUD inserted 01/23/13   Obesity    Osteoarthritis    RA (rheumatoid arthritis) ([place])    Trichomonas    Twitching     Family History  Problem Relation Age of Onset   Diabetes Mother    COPD Mother     Arthritis Mother    Hypertension Mother    Heart disease Mother    Congestive Heart Failure Mother    Hearing loss Father    Asthma Son    Cancer Maternal Grandmother        lung   Heart disease Paternal Grandmother        CHF   Past Surgical History:  Procedure Laterality Date   APPENDECTOMY     CHOLECYSTECTOMY     HAND SURGERY     OVARIAN CYST REMOVAL N/A 08/17/2007   TONSILLECTOMY     Social History   Social History Narrative   Works at a [employer].   Has children.   Does not smoke.   Eats meat fruits and vegetables.   Wears her seatbelt.      Lives at home with her son.   Right-handed.   One cup caffeine, three times per week.   Immunization History  Administered Date(s) Administered   PFIZER(Purple Top)SARS-COV-2 Vaccination 07/02/2020, 07/23/2020     Objective: Vital Signs: There were no vitals taken for this visit.   Physical Exam   Musculoskeletal Exam: ***  CDAI Exam: CDAI Score: -- Patient Global: --; Provider Global: -- Swollen: --; Tender: -- Joint Exam 06/10/2021   No joint exam has been documented for this visit   There is currently no information documented on the homunculus. Go to the Rheumatology activity and complete the homunculus joint exam.  Investigation: No additional findings.  Imaging: No results found.  Recent Labs: Lab Results  Component Value Date   WBC 9.4 01/14/2021  HGB 13.5 01/14/2021   PLT 291 01/14/2021   NA 139 01/14/2021   K 4.0 01/14/2021   CL 104 01/14/2021   CO2 29 01/14/2021   GLUCOSE 76 01/14/2021   BUN 11 01/14/2021   CREATININE 0.61 01/14/2021   BILITOT 0.3 01/14/2021   ALKPHOS 75 06/17/2017   AST 18 01/14/2021   ALT 24 01/14/2021   PROT 6.9 01/14/2021   ALBUMIN 4.5 06/17/2017   CALCIUM 9.3 01/14/2021   GFRAA 123 01/14/2021    Speciality Comments: No specialty comments available.  Procedures:  No procedures performed Allergies: Contrast media [iodinated diagnostic agents]    Assessment / Plan:     Visit Diagnoses: No diagnosis found.  Orders: No orders of the defined types were placed in this encounter.  No orders of the defined types were placed in this encounter.   Face-to-face time spent with patient was *** minutes. Greater than 50% of time was spent in counseling and coordination of care.  Follow-Up Instructions: No follow-ups on file.   Earnestine Mealing, CMA  Note - This record has been created using Editor, commissioning.  Chart creation errors have been sought, but may not always  have been located. Such creation errors do not reflect on  the standard of medical care.

## 2021-06-08 NOTE — Progress Notes (Signed)
Office Visit Note  Patient: [name]             Date of Birth: Jun 18, 1971           MRN: [id number]             PCP: [facility] Referring: [facility] Visit Date: 06/12/2021 Occupation: @GUAROCC @  Subjective:  Right hand pain   History of Present Illness: [name] is a 50 y.o. female with history of seropositive rheumatoid arthritis.  Patient is taking methotrexate 7 tablets by mouth once weekly and folic acid 2 mg by mouth daily.  She forgot to take her dose of methotrexate this week.  She denies any recent rheumatoid arthritis flares.  She states that she is currently having pain in the right first PIP joint and fourth PIP joint.  She denies any joint swelling at this time.  She states her lower back discomfort and stiffness has improved.  She denies any shoulder joint pain at this time.  She denies any new concerns. She denies any recent infections.      Activities of Daily Living:  Patient reports morning stiffness for 0 minutes.   Patient Denies nocturnal pain.  Difficulty dressing/grooming: Denies Difficulty climbing stairs: Denies Difficulty getting out of chair: Denies Difficulty using hands for taps, buttons, cutlery, and/or writing: Denies  Review of Systems  Constitutional:  Positive for fatigue.  HENT:  Positive for nosebleeds. Negative for mouth sores, mouth dryness and nose dryness.   Eyes:  Negative for pain, itching and dryness.  Respiratory:  Negative for shortness of breath and difficulty breathing.   Cardiovascular:  Negative for chest pain and palpitations.  Gastrointestinal:  Positive for constipation. Negative for blood in stool and diarrhea.  Endocrine: Negative for increased urination.  Genitourinary:  Negative for difficulty urinating.  Musculoskeletal:  Positive for joint pain, joint pain and joint swelling. Negative for myalgias, morning stiffness, muscle tenderness and myalgias.  Skin:   Positive for rash. Negative for color change.  Allergic/Immunologic: Negative for susceptible to infections.  Neurological:  Positive for light-headedness. Negative for dizziness, numbness, headaches, memory loss and weakness.  Hematological:  Negative for bruising/bleeding tendency.  Psychiatric/Behavioral:  Negative for confusion.    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   Multiple acquired skin tags 07/14/2017   Rheumatoid arthritis with rheumatoid factor of multiple sites without organ or systems involvement ([place]) 11/09/2016   High risk medication use 11/09/2016   Non compliance w medication regimen 11/09/2016   Constipation 06/16/2016   Obesity 05/16/2015    Past Medical History:  Diagnosis Date   Arthritis    BV (bacterial vaginosis)    Constipation 06/16/2016   HSV (herpes simplex virus) infection    IUD (intrauterine device) in place 03/20/2014   IUD inserted 01/23/13   Obesity    Osteoarthritis    RA (rheumatoid arthritis) ([place])    Trichomonas    Twitching     Family History  Problem Relation Age of Onset   Diabetes Mother    COPD Mother    Arthritis Mother    Hypertension Mother    Heart disease Mother    Congestive Heart Failure Mother  Hearing loss Father    Asthma Son    Cancer Maternal Grandmother        lung   Heart disease Paternal Grandmother        CHF   Past Surgical History:  Procedure Laterality Date   APPENDECTOMY     CHOLECYSTECTOMY     HAND SURGERY     OVARIAN CYST REMOVAL N/A 08/17/2007   TONSILLECTOMY     Social History   Social History Narrative   Works at a [employer].   Has children.   Does not smoke.   Eats meat fruits and vegetables.   Wears her seatbelt.       Lives at home with her son.   Right-handed.   One cup caffeine, three times per week.   Immunization History  Administered Date(s) Administered   PFIZER(Purple Top)SARS-COV-2 Vaccination 07/02/2020, 07/23/2020     Objective: Vital Signs: BP (!) 150/87 (BP Location: Left Arm, Patient Position: Sitting, Cuff Size: Large)   Pulse 83   Ht 5' 1.5" (1.562 m)   Wt 244 lb 6.4 oz (110.9 kg)   BMI 45.43 kg/m    Physical Exam Vitals and nursing note reviewed.  Constitutional:      Appearance: She is well-developed.  HENT:     Head: Normocephalic and atraumatic.  Eyes:     Conjunctiva/sclera: Conjunctivae normal.  Pulmonary:     Effort: Pulmonary effort is normal.  Abdominal:     Palpations: Abdomen is soft.  Musculoskeletal:     Cervical back: Normal range of motion.  Skin:    General: Skin is warm and dry.     Capillary Refill: Capillary refill takes less than 2 seconds.  Neurological:     Mental Status: She is alert and oriented to person, place, and time.  Psychiatric:        Behavior: Behavior normal.     Musculoskeletal Exam: C-spine, thoracic spine, and lumbar spine good ROM.  No midline spinal tenderness or SI joint tenderness noted.  Shoulder joints, elbow joints, wrist joints, MCPs, PIPs, and DIPs good ROM with no synovitis.  Complete fist formation bilaterally.  Tenderness over the right first PIP and right fourth PIP joint.  Hip joints, knee joints, and ankle joints good ROM with no discomfort.  No warmth or effusion of knee joints.  No tenderness or swelling of ankle joints.   CDAI Exam: CDAI Score: 2.6  Patient Global: 3 mm; Provider Global: 3 mm Swollen: 0 ; Tender: 2  Joint Exam 06/12/2021      Right  Left  IP   Tender     PIP 4   Tender        Investigation: No additional findings.  Imaging: No results found.  Recent Labs: Lab Results  Component Value Date   WBC 9.4 01/14/2021   HGB 13.5 01/14/2021   PLT 291 01/14/2021   NA 139 01/14/2021    K 4.0 01/14/2021   CL 104 01/14/2021   CO2 29 01/14/2021   GLUCOSE 76 01/14/2021   BUN 11 01/14/2021   CREATININE 0.61 01/14/2021   BILITOT 0.3 01/14/2021   ALKPHOS 75 06/17/2017   AST 18 01/14/2021   ALT 24 01/14/2021   PROT 6.9 01/14/2021   ALBUMIN 4.5 06/17/2017   CALCIUM 9.3 01/14/2021   GFRAA 123 01/14/2021    Speciality Comments: No specialty comments available.  Procedures:  No procedures performed Allergies: Contrast media [iodinated diagnostic agents]   Assessment / Plan:  Visit Diagnoses: Rheumatoid arthritis with rheumatoid factor of multiple sites without organ or systems involvement ([place]) - +RF, +CCP: She has tenderness palpation over the right first PIP and fourth PIP joint.  No synovitis was noted.  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  She has not had any recent rheumatoid arthritis flares.  Overall she has clinically been doing well taking methotrexate 7 tablets by mouth once weekly and folic acid 2 mg by mouth daily.  She forgot to take her dose of methotrexate this week.  We discussed the importance of staying compliant with taking methotrexate as prescribed.  She had x-rays of both hands and feet on 01/14/2021 which were consistent with rheumatoid arthritis and osteoarthritis overlap.  No erosive changes were noted at that time.  We discussed the importance of joint protection and muscle strengthening.  A refill of Voltaren gel will be sent to the pharmacy which she can apply topically as needed for joint pain.  She will remain on folic acid and methotrexate as prescribed.  A refill of methotrexate was sent to the pharmacy today.  She was advised to notify us if she develops increased joint pain or joint swelling.  She will follow-up in the office in 5 months.  High risk medication use - Methotrexate 7 tablets by mouth once weekly and folic acid 2 mg by mouth daily. CBC and CMP were drawn on 01/14/2021.  She is overdue to update lab  work.  Orders for CBC and CMP were released.  Her next lab work will be due in October and every 3 months to monitor for drug toxicity.  Standing orders for CBC and CMP were placed today. - Plan: COMPLETE METABOLIC PANEL WITH GFR, CBC with Differential/Platelet, CBC with Differential/Platelet, COMPLETE METABOLIC PANEL WITH GFR She has not had any recent infections.  Discussed the importance of holding methotrexate if she develops signs or symptoms of an infection and to resume once infection has completely cleared.  Acute pain of left shoulder: Resolved.  She has good range of motion of the left shoulder joint with no discomfort.  Chronic right shoulder pain - She had a cortisone injection performed on 01/21/2020 which resolved her discomfort.  She has good range of motion with no discomfort at this time.  Chronic SI joint pain: Resolved.  She has no tenderness to palpation.  Her lower back pain and stiffness has improved since starting to sleep on her couch at night.  Facial twitching -Previously evaluated by Dr. Krista Blue on 11/13/2020  Orders: Orders Placed This Encounter  Procedures   COMPLETE METABOLIC PANEL WITH GFR   CBC with Differential/Platelet   CBC with Differential/Platelet   COMPLETE METABOLIC PANEL WITH GFR    Meds ordered this encounter  Medications   methotrexate (RHEUMATREX) 2.5 MG tablet    Sig: Take 7 tablets by mouth once weekly. Caution:Chemotherapy. Protect from light.    Dispense:  84 tablet    Refill:  0   diclofenac Sodium (VOLTAREN) 1 % GEL    Sig: Apply 2-4 grams to affected joint 4 times daily as needed.    Dispense:  400 g    Refill:  2      Follow-Up Instructions: Return in about 5 months (around 11/12/2021) for Rheumatoid arthritis.   Ofilia Neas, PA-C  Note - This record has been created using Dragon software.  Chart creation errors have been sought, but may not always  have been located. Such creation errors do  not reflect on  the standard of  medical care.

## 2021-06-10 ENCOUNTER — Ambulatory Visit: Payer: No Typology Code available for payment source | Admitting: Physician Assistant

## 2021-06-12 ENCOUNTER — Other Ambulatory Visit: Payer: Self-pay

## 2021-06-12 ENCOUNTER — Ambulatory Visit: Payer: No Typology Code available for payment source | Admitting: Physician Assistant

## 2021-06-12 ENCOUNTER — Encounter: Payer: Self-pay | Admitting: Physician Assistant

## 2021-06-12 ENCOUNTER — Other Ambulatory Visit: Payer: Self-pay | Admitting: Physician Assistant

## 2021-06-12 VITALS — BP 150/87 | HR 83 | Ht 61.5 in | Wt 244.4 lb

## 2021-06-12 DIAGNOSIS — M25512 Pain in left shoulder: Secondary | ICD-10-CM

## 2021-06-12 DIAGNOSIS — G514 Facial myokymia: Secondary | ICD-10-CM

## 2021-06-12 DIAGNOSIS — M533 Sacrococcygeal disorders, not elsewhere classified: Secondary | ICD-10-CM

## 2021-06-12 DIAGNOSIS — M0579 Rheumatoid arthritis with rheumatoid factor of multiple sites without organ or systems involvement: Secondary | ICD-10-CM | POA: Diagnosis not present

## 2021-06-12 DIAGNOSIS — G8929 Other chronic pain: Secondary | ICD-10-CM

## 2021-06-12 DIAGNOSIS — M25511 Pain in right shoulder: Secondary | ICD-10-CM

## 2021-06-12 DIAGNOSIS — Z79899 Other long term (current) drug therapy: Secondary | ICD-10-CM

## 2021-06-12 LAB — CBC WITH DIFFERENTIAL/PLATELET
Absolute Monocytes: 676 cells/uL (ref 200–950)
Basophils Absolute: 71 cells/uL (ref 0–200)
Basophils Relative: 0.8 %
Eosinophils Absolute: 240 cells/uL (ref 15–500)
Eosinophils Relative: 2.7 %
HCT: 42.8 % (ref 35.0–45.0)
Hemoglobin: 14 g/dL (ref 11.7–15.5)
Lymphs Abs: 1638 cells/uL (ref 850–3900)
MCH: 31.5 pg (ref 27.0–33.0)
MCHC: 32.7 g/dL (ref 32.0–36.0)
MCV: 96.4 fL (ref 80.0–100.0)
MPV: 10 fL (ref 7.5–12.5)
Monocytes Relative: 7.6 %
Neutro Abs: 6275 cells/uL (ref 1500–7800)
Neutrophils Relative %: 70.5 %
Platelets: 309 10*3/uL (ref 140–400)
RBC: 4.44 10*6/uL (ref 3.80–5.10)
RDW: 13 % (ref 11.0–15.0)
Total Lymphocyte: 18.4 %
WBC: 8.9 10*3/uL (ref 3.8–10.8)

## 2021-06-12 LAB — COMPLETE METABOLIC PANEL WITH GFR
AG Ratio: 1.5 (calc) (ref 1.0–2.5)
ALT: 25 U/L (ref 6–29)
AST: 20 U/L (ref 10–35)
Albumin: 4.3 g/dL (ref 3.6–5.1)
Alkaline phosphatase (APISO): 86 U/L (ref 37–153)
BUN: 11 mg/dL (ref 7–25)
CO2: 31 mmol/L (ref 20–32)
Calcium: 9.7 mg/dL (ref 8.6–10.4)
Chloride: 103 mmol/L (ref 98–110)
Creat: 0.75 mg/dL (ref 0.50–1.03)
Globulin: 2.9 g/dL (calc) (ref 1.9–3.7)
Glucose, Bld: 88 mg/dL (ref 65–99)
Potassium: 4.3 mmol/L (ref 3.5–5.3)
Sodium: 140 mmol/L (ref 135–146)
Total Bilirubin: 0.3 mg/dL (ref 0.2–1.2)
Total Protein: 7.2 g/dL (ref 6.1–8.1)
eGFR: 97 mL/min/{1.73_m2} (ref >=60)

## 2021-06-12 MED ORDER — DICLOFENAC SODIUM 1 % EX GEL: CUTANEOUS | 2 refills | Status: DC

## 2021-06-12 MED ORDER — METHOTREXATE 2.5 MG PO TABS: ORAL_TABLET | ORAL | 0 refills | Status: DC

## 2021-06-12 NOTE — Patient Instructions (Signed)
Standing Labs We placed an order today for your standing lab work.   Please have your standing labs drawn in October and every 3 months   If possible, please have your labs drawn 2 weeks prior to your appointment so that the provider can discuss your results at your appointment.  Please note that you may see your imaging and lab results in MyChart before we have reviewed them. We may be awaiting multiple results to interpret others before contacting you. Please allow our office up to 72 hours to thoroughly review all of the results before contacting the office for clarification of your results.  We have open lab daily: Monday through Thursday from 1:30-4:30 PM and Friday from 1:30-4:00 PM at the office of Dr. Shaili Deveshwar, [facility].   Please be advised, all patients with office appointments requiring lab work will take precedent over walk-in lab work.  If possible, please come for your lab work on Monday and Friday afternoons, as you may experience shorter wait times. The office is located at [address], [address], [place], [place] [address] No appointment is necessary.   Labs are drawn by [facility]. Please bring your co-pay at the time of your lab draw.  You may receive a bill from [facility] for your lab work.  If you wish to have your labs drawn at another location, please call the office 24 hours in advance to send orders.  If you have any questions regarding directions or hours of operation,  please call [phone].   As a reminder, please drink plenty of water prior to coming for your lab work. Thanks!  

## 2021-06-15 NOTE — Progress Notes (Signed)
CBC and CMP WNL

## 2021-07-18 ENCOUNTER — Other Ambulatory Visit: Payer: Self-pay | Admitting: Physician Assistant

## 2021-08-04 IMAGING — MG MM DIGITAL SCREENING BILAT W/ TOMO AND CAD
6 of 10 series · 6 of 30 positions shown · non-contrast
Comparison: Previous exam(s).

CLINICAL DATA: Screening.

EXAM:
DIGITAL SCREENING BILATERAL MAMMOGRAM WITH TOMOSYNTHESIS AND CAD
TECHNIQUE: Bilateral screening digital craniocaudal and mediolateral oblique
mammograms were obtained. Bilateral screening digital breast
tomosynthesis was performed. The images were evaluated with
computer-aided detection.

[R CC synth-2D]
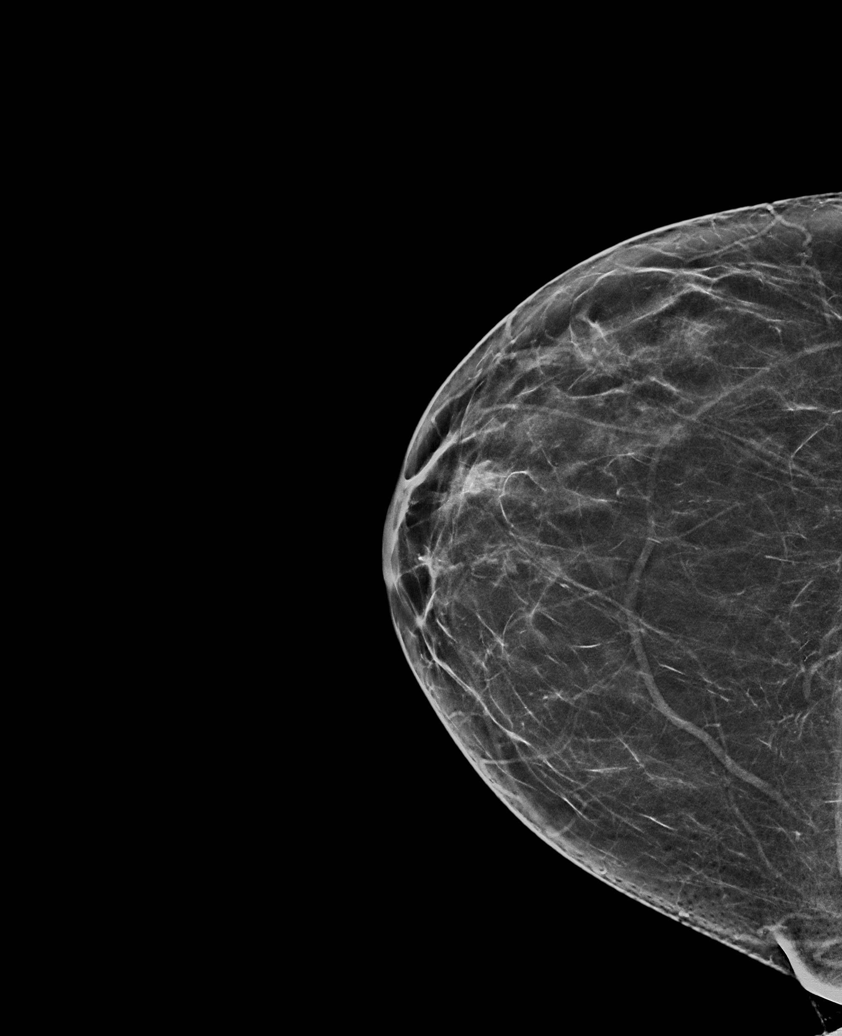

[R MLO synth-2D]
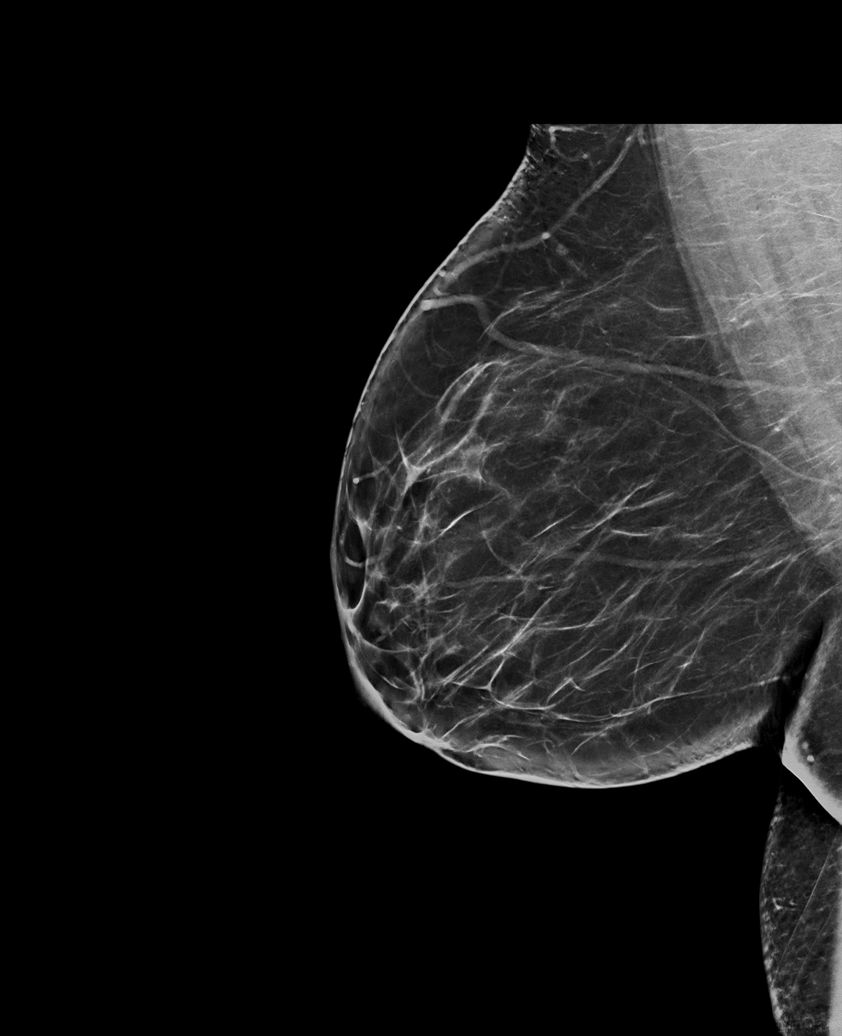

[L MLO synth-2D (1 of 2)]
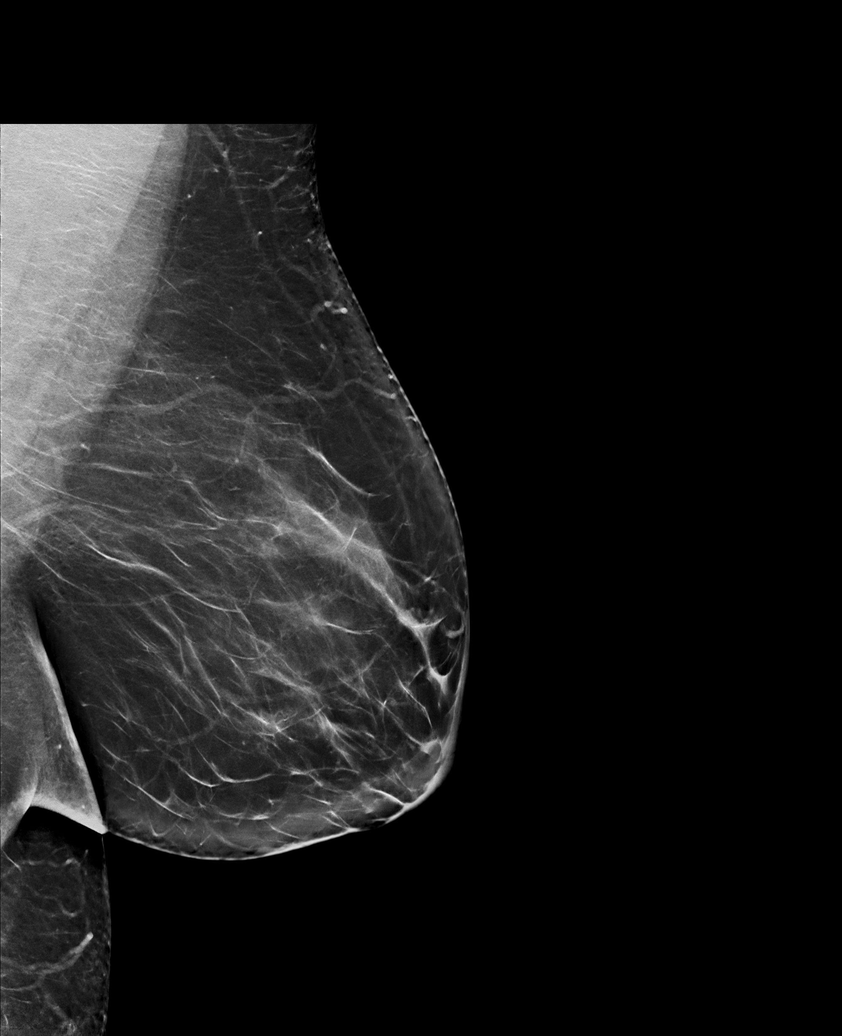

[L CC synth-2D]
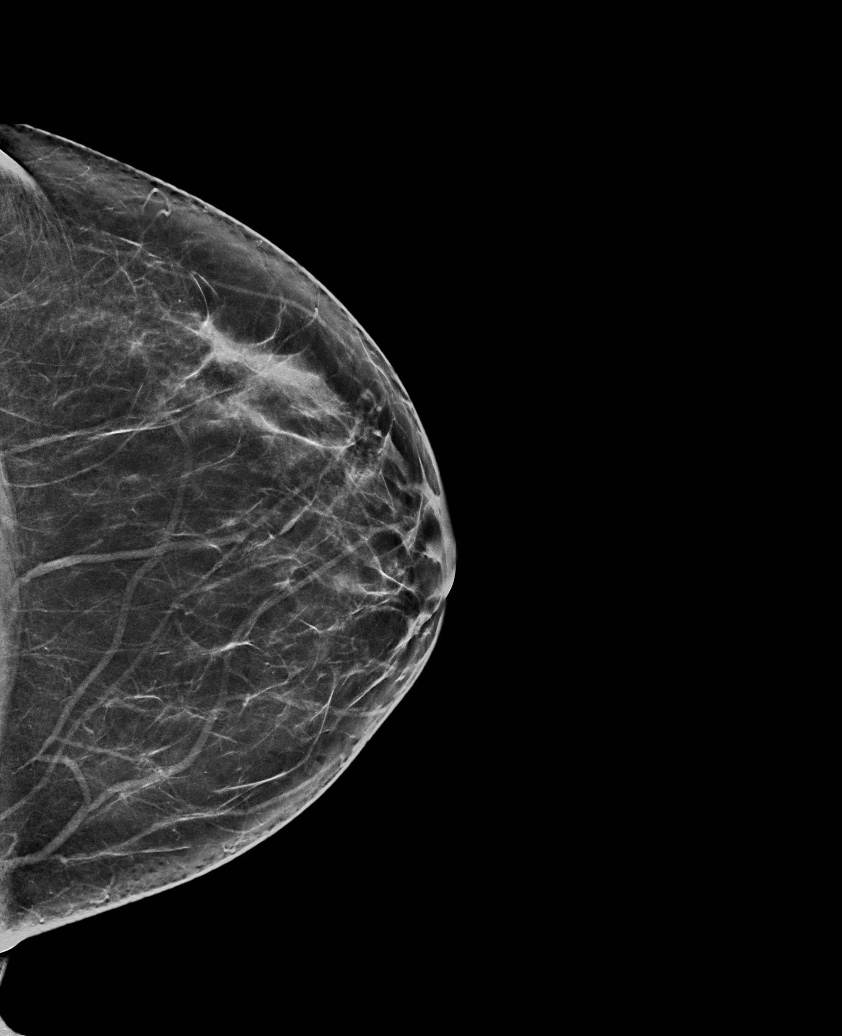

[L MLO synth-2D (2 of 2)]
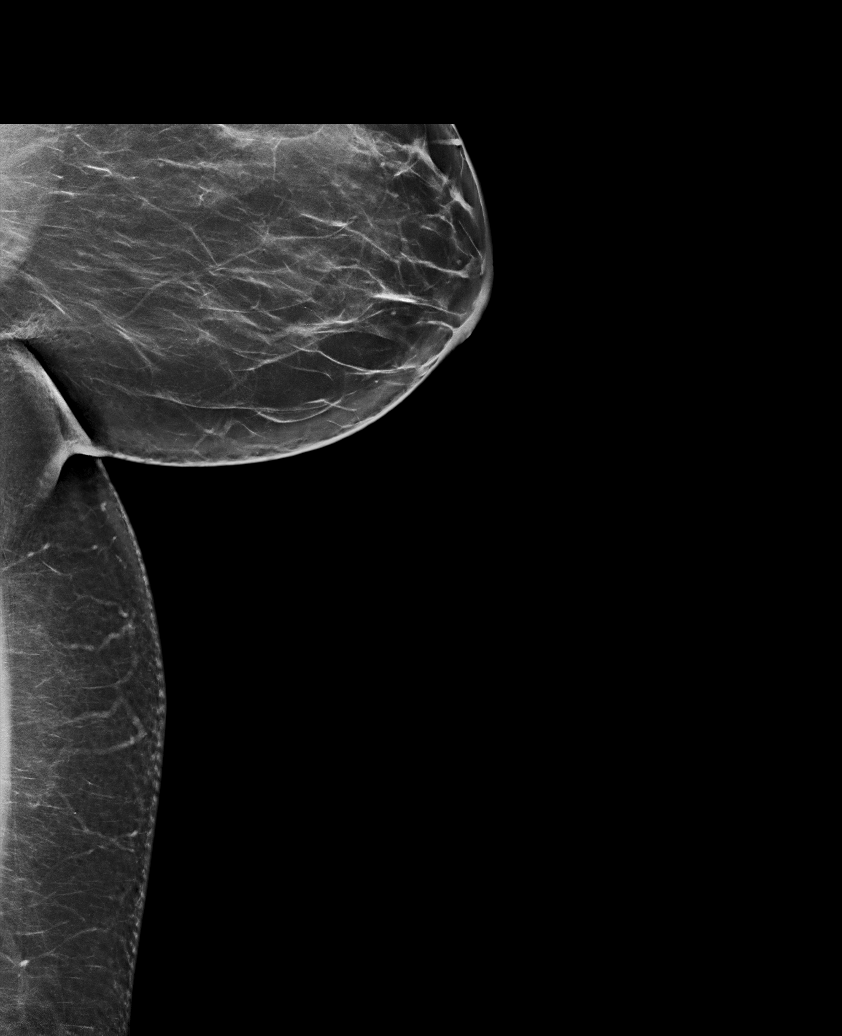

[L MLO tomo · tomo slice 43/84.0]
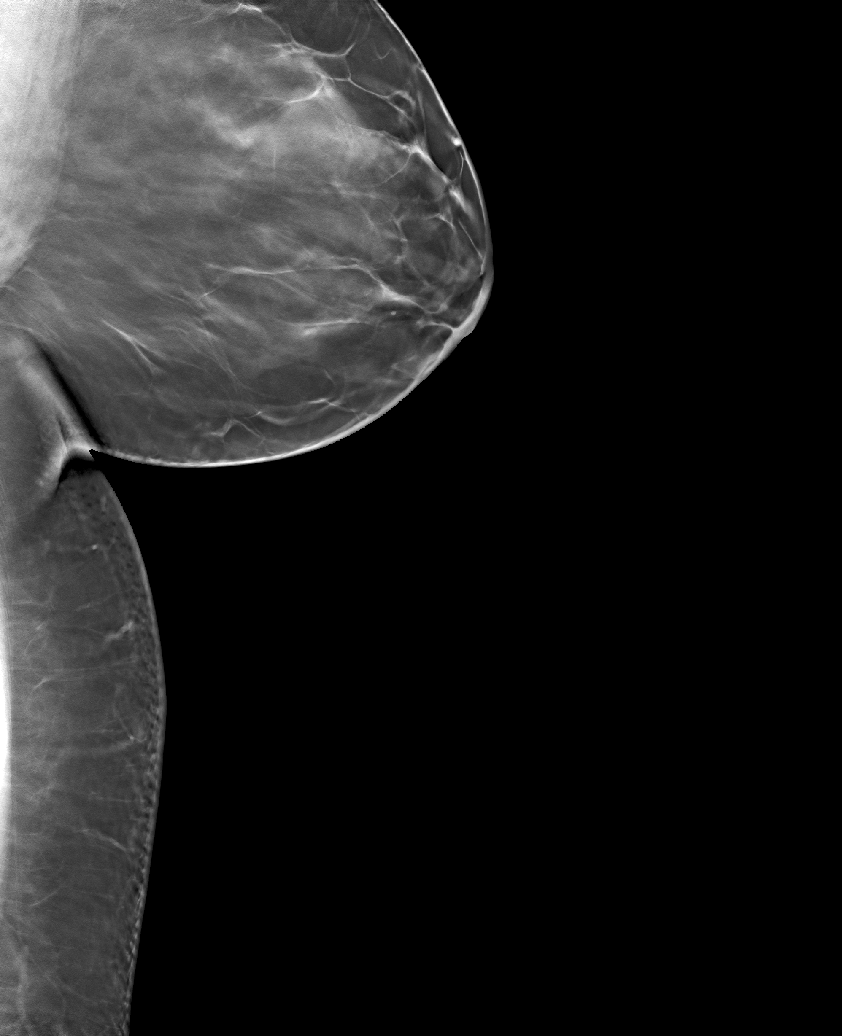

[6 of 30 positions shown; findings below may reference images not displayed]

ACR Breast Density Category b: There are scattered areas of
fibroglandular density.
FINDINGS: There are no findings suspicious for malignancy. The images were
evaluated with computer-aided detection.
IMPRESSION: No mammographic evidence of malignancy. A result letter of this
screening mammogram will be mailed directly to the patient.

RECOMMENDATION:
Screening mammogram in one year. (Code:[id number])

BI-RADS CATEGORY  1: Negative.

## 2021-08-26 ENCOUNTER — Other Ambulatory Visit: Payer: Self-pay | Admitting: Physician Assistant

## 2021-08-26 NOTE — Telephone Encounter (Signed)
Next Visit: 10/23/2021  Last Visit: 06/12/2021  Last Fill: 06/12/2021  DX: Rheumatoid arthritis with rheumatoid factor of multiple sites without organ or systems involvement   Current Dose per office note 06/12/2021: Methotrexate 7 tablets by mouth once weekly   Labs: 06/12/2021 CBC and CMP WNL  Okay to refill MTX?

## 2021-10-27 NOTE — Progress Notes (Deleted)
Office Visit Note  Patient: [name]             Date of Birth: [birth year]           MRN: [id number]             PCP: [facility] Referring: [facility] Visit Date: 11/10/2021 Occupation: @GUAROCC @  Subjective:  No chief complaint on file.   History of Present Illness: [name] is a 50 y.o. female ***   Activities of Daily Living:  Patient reports morning stiffness for *** {minute/hour:19697}.   Patient {ACTIONS;DENIES/REPORTS:21021675::"Denies"} nocturnal pain.  Difficulty dressing/grooming: {ACTIONS;DENIES/REPORTS:21021675::"Denies"} Difficulty climbing stairs: {ACTIONS;DENIES/REPORTS:21021675::"Denies"} Difficulty getting out of chair: {ACTIONS;DENIES/REPORTS:21021675::"Denies"} Difficulty using hands for taps, buttons, cutlery, and/or writing: {ACTIONS;DENIES/REPORTS:21021675::"Denies"}  No Rheumatology ROS completed.   PMFS History:  Patient Active Problem List   Diagnosis Date Noted  . Chronic migraine w/o aura w/o status migrainosus, not intractable 11/13/2020  . Fasciculation 11/13/2020  . Multiple thyroid nodules 12/13/2018  . Hair loss 12/13/2018  . Encounter for IUD insertion 01/26/2018  . 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 Multiple acquired skin tags 07/14/2017  . Rheumatoid arthritis with rheumatoid factor of multiple sites without organ or systems involvement ([place]) 11/09/2016  . High risk medication use 11/09/2016  . Non compliance w medication regimen 11/09/2016  . Constipation 06/16/2016  . Obesity 05/16/2015    Past Medical History:  Diagnosis Date  . Arthritis   . BV (bacterial vaginosis)   . Constipation 06/16/2016  . HSV (herpes simplex virus) infection   . IUD (intrauterine device) in place 03/20/2014   IUD inserted 01/23/13  . Obesity   . Osteoarthritis   . RA (rheumatoid arthritis) ([place])   . Trichomonas   . Twitching     Family History  Problem Relation Age of Onset  . Diabetes Mother    . COPD Mother   . Arthritis Mother   . Hypertension Mother   . Heart disease Mother   . Congestive Heart Failure Mother   . Hearing loss Father   . Asthma Son   . Cancer Maternal Grandmother        lung  . Heart disease Paternal Grandmother        CHF   Past Surgical History:  Procedure Laterality Date  . APPENDECTOMY    . CHOLECYSTECTOMY    . HAND SURGERY    . OVARIAN CYST REMOVAL N/A 08/17/2007  . TONSILLECTOMY     Social History   Social History Narrative   Works at a [employer].   Has children.   Does not smoke.   Eats meat fruits and vegetables.   Wears her seatbelt.      Lives at home with her son.   Right-handed.   One cup caffeine, three times per week.   Immunization History  Administered Date(s) Administered  . PFIZER(Purple Top)SARS-COV-2 Vaccination 07/02/2020, 07/23/2020     Objective: Vital Signs: There were no vitals taken for this visit.   Physical Exam   Musculoskeletal Exam: ***  CDAI Exam: CDAI Score: -- Patient Global: --; Provider Global: -- Swollen: --; Tender: -- Joint Exam 11/10/2021   No joint exam has been documented for this visit   There is currently no information documented on the homunculus. Go to the Rheumatology activity and complete the homunculus joint exam.  Investigation: No additional findings.  Imaging: No results found.  Recent Labs: Lab Results  Component Value Date   WBC 8.9 06/12/2021  HGB 14.0 06/12/2021   PLT 309 06/12/2021   NA 140 06/12/2021   K 4.3 06/12/2021   CL 103 06/12/2021   CO2 31 06/12/2021   GLUCOSE 88 06/12/2021   BUN 11 06/12/2021   CREATININE 0.75 06/12/2021   BILITOT 0.3 06/12/2021   ALKPHOS 75 06/17/2017   AST 20 06/12/2021   ALT 25 06/12/2021   PROT 7.2 06/12/2021   ALBUMIN 4.5 06/17/2017   CALCIUM 9.7 06/12/2021   GFRAA 123 01/14/2021    Speciality Comments: No specialty comments available.  Procedures:  No procedures performed Allergies: Contrast media  [iodinated diagnostic agents]   Assessment / Plan:     Visit Diagnoses: No diagnosis found.  Orders: No orders of the defined types were placed in this encounter.  No orders of the defined types were placed in this encounter.   Face-to-face time spent with patient was *** minutes. Greater than 50% of time was spent in counseling and coordination of care.  Follow-Up Instructions: No follow-ups on file.   Earnestine Mealing, CMA  Note - This record has been created using Editor, commissioning.  Chart creation errors have been sought, but may not always  have been located. Such creation errors do not reflect on  the standard of medical care.

## 2021-11-10 ENCOUNTER — Ambulatory Visit: Payer: No Typology Code available for payment source | Admitting: Rheumatology

## 2021-11-10 DIAGNOSIS — Z79899 Other long term (current) drug therapy: Secondary | ICD-10-CM

## 2021-11-10 DIAGNOSIS — M25512 Pain in left shoulder: Secondary | ICD-10-CM

## 2021-11-10 DIAGNOSIS — M0579 Rheumatoid arthritis with rheumatoid factor of multiple sites without organ or systems involvement: Secondary | ICD-10-CM

## 2021-11-10 DIAGNOSIS — G8929 Other chronic pain: Secondary | ICD-10-CM

## 2021-11-10 DIAGNOSIS — G514 Facial myokymia: Secondary | ICD-10-CM

## 2021-11-10 NOTE — Progress Notes (Signed)
Office Visit Note  Patient: [name]             Date of Birth: 18-Sep-1971           MRN: [id number]             PCP: [facility] Referring: [facility] [place] [facility] Visit Date: 11/19/2021 Occupation: @GUAROCC @  Subjective:  Medication monitoring  History of Present Illness: [name] is a 50 y.o. female with history of seropositive rheumatoid arthritis.  Patient is currently taking methotrexate 7 tablets by mouth once weekly and folic acid 2 mg daily.  She is tolerating methotrexate without any side effects and has not missed any doses recently.  She denies any signs or symptoms of a rheumatoid arthritis flare.  She has occasional discomfort in her shoulder joints but it has been tolerable and her pain has been self resolving.  She denies any joint swelling at this time.  She denies any morning stiffness.  She has not had any nocturnal pain.  She states that she was recently diagnosed with COVID-19 and was then diagnosed with a sinus infection afterwards.  She did not hold methotrexate during this time.    Activities of Daily Living:  Patient reports morning stiffness for 0 minutes.   Patient Denies nocturnal pain.  Difficulty dressing/grooming: Denies Difficulty climbing stairs: Denies Difficulty getting out of chair: Denies Difficulty using hands for taps, buttons, cutlery, and/or writing: Denies  Review of Systems  Constitutional:  Negative for fatigue.  HENT:  Negative for mouth sores, mouth dryness and nose dryness.   Eyes:  Negative for pain, itching and dryness.  Respiratory:  Negative for shortness of breath and difficulty breathing.   Cardiovascular:  Negative for chest pain and palpitations.  Gastrointestinal:  Positive for constipation. Negative for blood in stool and diarrhea.  Endocrine: Negative for increased urination.  Genitourinary:  Negative for difficulty urinating.  Musculoskeletal:  Negative for joint pain, joint pain,  joint swelling, myalgias, morning stiffness, muscle tenderness and myalgias.  Skin:  Negative for color change, rash and redness.  Allergic/Immunologic: Negative for susceptible to infections.  Neurological:  Negative for dizziness, numbness, headaches, memory loss and weakness.  Hematological:  Negative for bruising/bleeding tendency.  Psychiatric/Behavioral:  Negative for confusion.    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   Multiple acquired skin tags 07/14/2017   Rheumatoid arthritis with rheumatoid factor of multiple sites without organ or systems involvement ([place]) 11/09/2016   High risk medication use 11/09/2016   Non compliance w medication regimen 11/09/2016   Constipation 06/16/2016   Obesity 05/16/2015    Past Medical History:  Diagnosis Date   Arthritis    BV (bacterial vaginosis)    Constipation 06/16/2016   HSV (herpes simplex virus) infection    IUD (intrauterine device) in place 03/20/2014   IUD inserted 01/23/13   Obesity    Osteoarthritis    RA (rheumatoid arthritis) ([place])    Trichomonas    Twitching     Family History  Problem Relation Age of Onset   Diabetes Mother    COPD Mother    Arthritis Mother    Hypertension Mother    Heart disease Mother  Congestive Heart Failure Mother    Hearing loss Father    Asthma Son    Cancer Maternal Grandmother        lung   Heart disease Paternal Grandmother        CHF   Past Surgical History:  Procedure Laterality Date   APPENDECTOMY     CHOLECYSTECTOMY     HAND SURGERY     OVARIAN CYST REMOVAL N/A 08/17/2007   TONSILLECTOMY     Social History   Social History Narrative   Works at a [employer].   Has children.   Does not smoke.    Eats meat fruits and vegetables.   Wears her seatbelt.      Lives at home with her son.   Right-handed.   One cup caffeine, three times per week.   Immunization History  Administered Date(s) Administered   PFIZER(Purple Top)SARS-COV-2 Vaccination 07/02/2020, 07/23/2020     Objective: Vital Signs: BP (!) 149/98 (BP Location: Left Arm, Patient Position: Sitting, Cuff Size: Large)    Pulse 84    Ht 5\' 2"  (1.575 m)    Wt 230 lb 9.6 oz (104.6 kg)    BMI 42.18 kg/m    Physical Exam Vitals and nursing note reviewed.  Constitutional:      Appearance: She is well-developed.  HENT:     Head: Normocephalic and atraumatic.  Eyes:     Conjunctiva/sclera: Conjunctivae normal.  Pulmonary:     Effort: Pulmonary effort is normal.  Abdominal:     Palpations: Abdomen is soft.  Musculoskeletal:     Cervical back: Normal range of motion.  Skin:    General: Skin is warm and dry.     Capillary Refill: Capillary refill takes less than 2 seconds.  Neurological:     Mental Status: She is alert and oriented to person, place, and time.  Psychiatric:        Behavior: Behavior normal.     Musculoskeletal Exam: C-spine, thoracic spine, lumbar spine have good range of motion with no discomfort.  Shoulder joints, elbow joints, wrist joints, MCPs, PIPs, DIPs have good range of motion with no synovitis.  Complete fist formation bilaterally.  Hip joints, knee joints, and ankle joints have good range of motion with no discomfort or tenderness.  No tenderness over MTP joints noted.  No evidence of Achilles tendinitis or plantar fasciitis.  CDAI Exam: CDAI Score: 0  Patient Global: 0 mm; Provider Global: 0 mm Swollen: 0 ; Tender: 0  Joint Exam 11/19/2021   No joint exam has been documented for this visit   There is currently no information documented on the homunculus. Go to the Rheumatology activity and complete the homunculus joint exam.  Investigation: No additional findings.  Imaging: No results  found.  Recent Labs: Lab Results  Component Value Date   WBC 8.9 06/12/2021   HGB 14.0 06/12/2021   PLT 309 06/12/2021   NA 140 06/12/2021   K 4.3 06/12/2021   CL 103 06/12/2021   CO2 31 06/12/2021   GLUCOSE 88 06/12/2021   BUN 11 06/12/2021   CREATININE 0.75 06/12/2021   BILITOT 0.3 06/12/2021   ALKPHOS 75 06/17/2017   AST 20 06/12/2021   ALT 25 06/12/2021   PROT 7.2 06/12/2021   ALBUMIN 4.5 06/17/2017   CALCIUM 9.7 06/12/2021   GFRAA 123 01/14/2021    Speciality Comments: No specialty comments available.  Procedures:  No procedures performed Allergies: Contrast media [iodinated contrast media]   Assessment /  Plan:     Visit Diagnoses: Rheumatoid arthritis with rheumatoid factor of multiple sites without organ or systems involvement ([place]) - +RF, +CCP: She has no joint tenderness or synovitis on examination today.  She has not had any signs or symptoms of a rheumatoid arthritis flare.  She is clinically doing well taking methotrexate 7 tablets by mouth once weekly and folic acid 2 mg daily.  She continues to tolerate methotrexate without any side effects and has not missed any doses recently.  She has not had any nocturnal pain, morning stiffness, or difficulty with ADLs.  Overall her rheumatoid arthritis is well controlled on the current treatment regimen.  No medication changes will be made at this time.  She was advised to notify us if she develops increased joint pain or joint swelling.  She will follow-up in the office in 5 months.  High risk medication use - Methotrexate 7 tablets by mouth once weekly and folic acid 2 mg by mouth daily.  CBC and CMP were drawn on 06/12/2021.  She is overdue to update lab work.  Orders for CBC and CMP were released.  Her next lab work will be due in March and every 3 months to monitor for drug toxicity.  Standing orders for CBC and CMP remain in place.- Plan: CBC with Differential/Platelet, COMPLETE METABOLIC PANEL WITH GFR The patient was  recently diagnosed with COVID-19 followed by sinusitis.  She did not hold methotrexate during the infection.  Discussed the importance of holding methotrexate if she develops signs or symptoms of an infection and to resume once the infection has completely cleared.  Other medical conditions are listed as follows:  Acute pain of left shoulder - Resolved.  Chronic SI joint pain - Resolved.   Chronic right shoulder pain - She had a cortisone injection performed on 01/21/2020 which resolved her discomfort.   Facial twitching - Previously evaluated by Dr. Krista Blue on 11/13/2020.  Resolved.   Orders: Orders Placed This Encounter  Procedures   CBC with Differential/Platelet   COMPLETE METABOLIC PANEL WITH GFR   No orders of the defined types were placed in this encounter.    Follow-Up Instructions: Return in about 5 months (around 04/19/2022) for Rheumatoid arthritis.   Ofilia Neas, PA-C  Note - This record has been created using Dragon software.  Chart creation errors have been sought, but may not always  have been located. Such creation errors do not reflect on  the standard of medical care.

## 2021-11-19 ENCOUNTER — Encounter: Payer: Self-pay | Admitting: Physician Assistant

## 2021-11-19 ENCOUNTER — Other Ambulatory Visit: Payer: Self-pay

## 2021-11-19 ENCOUNTER — Ambulatory Visit: Payer: No Typology Code available for payment source | Admitting: Physician Assistant

## 2021-11-19 VITALS — BP 149/98 | HR 84 | Ht 62.0 in | Wt 230.6 lb

## 2021-11-19 DIAGNOSIS — G8929 Other chronic pain: Secondary | ICD-10-CM

## 2021-11-19 DIAGNOSIS — M25512 Pain in left shoulder: Secondary | ICD-10-CM | POA: Diagnosis not present

## 2021-11-19 DIAGNOSIS — G514 Facial myokymia: Secondary | ICD-10-CM

## 2021-11-19 DIAGNOSIS — Z79899 Other long term (current) drug therapy: Secondary | ICD-10-CM

## 2021-11-19 DIAGNOSIS — M533 Sacrococcygeal disorders, not elsewhere classified: Secondary | ICD-10-CM

## 2021-11-19 DIAGNOSIS — M0579 Rheumatoid arthritis with rheumatoid factor of multiple sites without organ or systems involvement: Secondary | ICD-10-CM

## 2021-11-19 DIAGNOSIS — M25511 Pain in right shoulder: Secondary | ICD-10-CM

## 2021-11-19 NOTE — Patient Instructions (Signed)
Standing Labs We placed an order today for your standing lab work.   Please have your standing labs drawn in march and every 3 months   If possible, please have your labs drawn 2 weeks prior to your appointment so that the provider can discuss your results at your appointment.  Please note that you may see your imaging and lab results in [place] before we have reviewed them. We may be awaiting multiple results to interpret others before contacting you. Please allow our office up to 72 hours to thoroughly review all of the results before contacting the office for clarification of your results.  We have open lab daily: Monday through Thursday from 1:30-4:30 PM and Friday from 1:30-4:00 PM at the office of Dr. Bo Merino, [facility].   Please be advised, all patients with office appointments requiring lab work will take precedent over walk-in lab work.  If possible, please come for your lab work on Monday and Friday afternoons, as you may experience shorter wait times. The office is located at [address] [place], [place], [place] [address] No appointment is necessary.   Labs are drawn by [facility]. Please bring your co-pay at the time of your lab draw.  You may receive a bill from [facility] for your lab work.  If you wish to have your labs drawn at another location, please call the office 24 hours in advance to send orders.  If you have any questions regarding directions or hours of operation,  please call [phone].   As a reminder, please drink plenty of water prior to coming for your lab work. Thanks!

## 2021-11-20 LAB — CBC WITH DIFFERENTIAL/PLATELET
Absolute Monocytes: 664 cells/uL (ref 200–950)
Basophils Absolute: 84 cells/uL (ref 0–200)
Basophils Relative: 1 %
Eosinophils Absolute: 193 cells/uL (ref 15–500)
Eosinophils Relative: 2.3 %
HCT: 42 % (ref 35.0–45.0)
Hemoglobin: 13.8 g/dL (ref 11.7–15.5)
Lymphs Abs: 1949 cells/uL (ref 850–3900)
MCH: 30.5 pg (ref 27.0–33.0)
MCHC: 32.9 g/dL (ref 32.0–36.0)
MCV: 92.9 fL (ref 80.0–100.0)
MPV: 10.6 fL (ref 7.5–12.5)
Monocytes Relative: 7.9 %
Neutro Abs: 5510 cells/uL (ref 1500–7800)
Neutrophils Relative %: 65.6 %
Platelets: 310 10*3/uL (ref 140–400)
RBC: 4.52 10*6/uL (ref 3.80–5.10)
RDW: 13 % (ref 11.0–15.0)
Total Lymphocyte: 23.2 %
WBC: 8.4 10*3/uL (ref 3.8–10.8)

## 2021-11-20 LAB — COMPLETE METABOLIC PANEL WITH GFR
AG Ratio: 1.4 (calc) (ref 1.0–2.5)
ALT: 28 U/L (ref 6–29)
AST: 20 U/L (ref 10–35)
Albumin: 4.3 g/dL (ref 3.6–5.1)
Alkaline phosphatase (APISO): 81 U/L (ref 37–153)
BUN: 11 mg/dL (ref 7–25)
CO2: 30 mmol/L (ref 20–32)
Calcium: 9.2 mg/dL (ref 8.6–10.4)
Chloride: 103 mmol/L (ref 98–110)
Creat: 0.73 mg/dL (ref 0.50–1.03)
Globulin: 3 g/dL (calc) (ref 1.9–3.7)
Glucose, Bld: 74 mg/dL (ref 65–99)
Potassium: 4 mmol/L (ref 3.5–5.3)
Sodium: 140 mmol/L (ref 135–146)
Total Bilirubin: 0.6 mg/dL (ref 0.2–1.2)
Total Protein: 7.3 g/dL (ref 6.1–8.1)
eGFR: 100 mL/min/{1.73_m2} (ref >=60)

## 2021-11-20 NOTE — Progress Notes (Signed)
CBC and CMP WNL

## 2021-12-24 ENCOUNTER — Other Ambulatory Visit: Payer: Self-pay | Admitting: Physician Assistant

## 2021-12-24 NOTE — Telephone Encounter (Signed)
Next Visit: 04/21/2022  Last Visit: 11/19/2021  Last Fill:   DX: Rheumatoid arthritis with rheumatoid factor of multiple sites without organ or systems involvement   Current Dose per office note 11/19/2021: Methotrexate 7 tablets by mouth once weekly   Labs: 11/19/2021 CBC and CMP WNL  Okay to refill MTX?

## 2022-03-12 ENCOUNTER — Other Ambulatory Visit: Payer: Self-pay | Admitting: Physician Assistant

## 2022-03-15 NOTE — Telephone Encounter (Signed)
Next Visit: 04/21/2022 ? ?Last Visit: 11/19/2021 ? ?Last Fill: 12/24/2021 ? ?DX: Rheumatoid arthritis with rheumatoid factor of multiple sites without organ or systems involvement  ? ?Current Dose per office note 11/19/2021: Methotrexate 7 tablets by mouth once weekly  ? ?Labs: 11/19/2021 CBC and CMP WNL ? ?Patient advised she is due to update labs. Patient will update labs this week.  ? ?Okay to refill MTX?  ?

## 2022-04-02 ENCOUNTER — Telehealth: Payer: Self-pay | Admitting: Rheumatology

## 2022-04-02 ENCOUNTER — Other Ambulatory Visit: Payer: Self-pay | Admitting: *Deleted

## 2022-04-02 DIAGNOSIS — Z79899 Other long term (current) drug therapy: Secondary | ICD-10-CM

## 2022-04-02 NOTE — Telephone Encounter (Signed)
Patient called the office requesting lab orders be sent to [facility] in [place]. Patient would like to go today and requests a call when those have been released.  ?

## 2022-04-02 NOTE — Telephone Encounter (Signed)
Labs orders released.  

## 2022-04-07 NOTE — Progress Notes (Deleted)
Office Visit Note  Patient: [name]             Date of Birth: 1971-04-25           MRN: [id number]             [facility]P: [facility] Referring: [facility] Visit Date: 04/21/2022 Occupation: '@GUAROCC'$ @  Subjective:  No chief complaint on file.   History of Present Illness: [name] is a 51 y.o. female ***   Activities of Daily Living:  Patient reports morning stiffness for *** {minute/hour:19697}.   Patient {ACTIONS;DENIES/REPORTS:21021675::"Denies"} nocturnal pain.  Difficulty dressing/grooming: {ACTIONS;DENIES/REPORTS:21021675::"Denies"} Difficulty climbing stairs: {ACTIONS;DENIES/REPORTS:21021675::"Denies"} Difficulty getting out of chair: {ACTIONS;DENIES/REPORTS:21021675::"Denies"} Difficulty using hands for taps, buttons, cutlery, and/or writing: {ACTIONS;DENIES/REPORTS:21021675::"Denies"}  No Rheumatology ROS completed.   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   Multiple acquired skin tags 07/14/2017   Rheumatoid arthritis with rheumatoid factor of multiple sites without organ or systems involvement ([place]) 11/09/2016   High risk medication use 11/09/2016   Non compliance w medication regimen 11/09/2016   Constipation 06/16/2016   Obesity 05/16/2015    Past Medical History:  Diagnosis Date   Arthritis    BV (bacterial vaginosis)    Constipation 06/16/2016   HSV (herpes simplex virus) infection    IUD (intrauterine device) in place 03/20/2014   IUD inserted 01/23/13   Obesity    Osteoarthritis    RA (rheumatoid arthritis) ([place])    Trichomonas    Twitching     Family History  Problem Relation Age of Onset   Diabetes Mother    COPD Mother     Arthritis Mother    Hypertension Mother    Heart disease Mother    Congestive Heart Failure Mother    Hearing loss Father    Asthma Son    Cancer Maternal Grandmother        lung   Heart disease Paternal Grandmother        CHF   Past Surgical History:  Procedure Laterality Date   APPENDECTOMY     CHOLECYSTECTOMY     HAND SURGERY     OVARIAN CYST REMOVAL N/A 08/17/2007   TONSILLECTOMY     Social History   Social History Narrative   Works at a [employer].   Has children.   Does not smoke.   Eats meat fruits and vegetables.   Wears her seatbelt.      Lives at home with her son.   Right-handed.   One cup caffeine, three times per week.   Immunization History  Administered Date(s) Administered   PFIZER(Purple Top)SARS-COV-2 Vaccination 07/02/2020, 07/23/2020     Objective: Vital Signs: There were no vitals taken for this visit.   Physical Exam   Musculoskeletal Exam: ***  CDAI Exam: CDAI Score: -- Patient Global: --; Provider Global: -- Swollen: --; Tender: -- Joint Exam 04/21/2022   No joint exam has been documented for this visit   There is currently no information documented on the homunculus. Go to the Rheumatology activity and complete the homunculus joint exam.  Investigation: No additional findings.  Imaging: No results found.  Recent Labs: Lab Results  Component Value Date   WBC 8.4 11/19/2021  HGB 13.8 11/19/2021   PLT 310 11/19/2021   NA 140 11/19/2021   K 4.0 11/19/2021   CL 103 11/19/2021   CO2 30 11/19/2021   GLUCOSE 74 11/19/2021   BUN 11 11/19/2021   CREATININE 0.73 11/19/2021   BILITOT 0.6 11/19/2021   ALKPHOS 75 06/17/2017   AST 20 11/19/2021   ALT 28 11/19/2021   PROT 7.3 11/19/2021   ALBUMIN 4.5 06/17/2017   CALCIUM 9.2 11/19/2021   GFRAA 123 01/14/2021    Speciality Comments: No specialty comments available.  Procedures:  No procedures performed Allergies: Contrast media [iodinated contrast media]   Assessment  / Plan:     Visit Diagnoses: No diagnosis found.  Orders: No orders of the defined types were placed in this encounter.  No orders of the defined types were placed in this encounter.   Face-to-face time spent with patient was *** minutes. Greater than 50% of time was spent in counseling and coordination of care.  Follow-Up Instructions: No follow-ups on file.   Earnestine Mealing, CMA  Note - This record has been created using Editor, commissioning.  Chart creation errors have been sought, but may not always  have been located. Such creation errors do not reflect on  the standard of medical care.

## 2022-04-21 ENCOUNTER — Ambulatory Visit: Payer: No Typology Code available for payment source | Admitting: Rheumatology

## 2022-04-21 DIAGNOSIS — G514 Facial myokymia: Secondary | ICD-10-CM

## 2022-04-21 DIAGNOSIS — Z79899 Other long term (current) drug therapy: Secondary | ICD-10-CM

## 2022-04-21 DIAGNOSIS — M25512 Pain in left shoulder: Secondary | ICD-10-CM

## 2022-04-21 DIAGNOSIS — G8929 Other chronic pain: Secondary | ICD-10-CM

## 2022-04-21 DIAGNOSIS — M0579 Rheumatoid arthritis with rheumatoid factor of multiple sites without organ or systems involvement: Secondary | ICD-10-CM

## 2022-04-21 NOTE — Progress Notes (Deleted)
Office Visit Note  Patient: [name]             Date of Birth: [birth year]           MRN: [id number]             [facility]P: [facility] Referring: [facility] Visit Date: 04/23/2022 Occupation: '@GUAROCC'$ @  Subjective:    History of Present Illness: [name] is a 51 y.o. female with history of seropositive rheumatoid arthritis.  She is currently taking methotrexate 7 tablets by mouth once weekly and folic acid 2 mg daily.   CBC and CMP updated on 11/19/21.  She is overdue to update lab work so orders were released.  Her next lab work will be due in September and every 3 months.   Discussed the importance of holding methotrexate if she develops signs or symptoms of an infection and to resume once the infection has completely resolved.   Activities of Daily Living:  Patient reports morning stiffness for *** {minute/hour:19697}.   Patient {ACTIONS;DENIES/REPORTS:21021675::"Denies"} nocturnal pain.  Difficulty dressing/grooming: {ACTIONS;DENIES/REPORTS:21021675::"Denies"} Difficulty climbing stairs: {ACTIONS;DENIES/REPORTS:21021675::"Denies"} Difficulty getting out of chair: {ACTIONS;DENIES/REPORTS:21021675::"Denies"} Difficulty using hands for taps, buttons, cutlery, and/or writing: {ACTIONS;DENIES/REPORTS:21021675::"Denies"}  No Rheumatology ROS completed.   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   Multiple acquired skin tags 07/14/2017   Rheumatoid arthritis with rheumatoid factor of multiple sites without organ or systems involvement ([place]) 11/09/2016   High risk medication use 11/09/2016   Non compliance w medication regimen 11/09/2016   Constipation 06/16/2016    Obesity 05/16/2015    Past Medical History:  Diagnosis Date   Arthritis    BV (bacterial vaginosis)    Constipation 06/16/2016   HSV (herpes simplex virus) infection    IUD (intrauterine device) in place 03/20/2014   IUD inserted 01/23/13   Obesity    Osteoarthritis    RA (rheumatoid arthritis) ([place])    Trichomonas    Twitching     Family History  Problem Relation Age of Onset   Diabetes Mother    COPD Mother    Arthritis Mother    Hypertension Mother    Heart disease Mother    Congestive Heart Failure Mother    Hearing loss Father    Asthma Son    Cancer Maternal Grandmother        lung   Heart disease Paternal Grandmother        CHF   Past Surgical History:  Procedure Laterality Date   APPENDECTOMY     CHOLECYSTECTOMY     HAND SURGERY     OVARIAN CYST REMOVAL N/A 08/17/2007   TONSILLECTOMY     Social History   Social History Narrative   Works at a [employer].   Has children.   Does not smoke.   Eats meat fruits and vegetables.   Wears her seatbelt.      Lives at home with her son.   Right-handed.   One cup caffeine, three times per week.   Immunization History  Administered Date(s) Administered   PFIZER(Purple Top)SARS-COV-2 Vaccination 07/02/2020, 07/23/2020     Objective: Vital Signs: There were no vitals taken for this visit.   Physical Exam Vitals and nursing  note reviewed.  Constitutional:      Appearance: She is well-developed.  HENT:     Head: Normocephalic and atraumatic.  Eyes:     Conjunctiva/sclera: Conjunctivae normal.  Cardiovascular:     Rate and Rhythm: Normal rate and regular rhythm.     Heart sounds: Normal heart sounds.  Pulmonary:     Effort: Pulmonary effort is normal.     Breath sounds: Normal breath sounds.  Abdominal:     General: Bowel sounds are normal.     Palpations: Abdomen is soft.  Musculoskeletal:     Cervical back: Normal range of motion.  Skin:    General: Skin is warm and dry.     Capillary Refill:  Capillary refill takes less than 2 seconds.  Neurological:     Mental Status: She is alert and oriented to person, place, and time.  Psychiatric:        Behavior: Behavior normal.     Musculoskeletal Exam: ***  CDAI Exam: CDAI Score: -- Patient Global: --; Provider Global: -- Swollen: --; Tender: -- Joint Exam 04/23/2022   No joint exam has been documented for this visit   There is currently no information documented on the homunculus. Go to the Rheumatology activity and complete the homunculus joint exam.  Investigation: No additional findings.  Imaging: No results found.  Recent Labs: Lab Results  Component Value Date   WBC 8.4 11/19/2021   HGB 13.8 11/19/2021   PLT 310 11/19/2021   NA 140 11/19/2021   K 4.0 11/19/2021   CL 103 11/19/2021   CO2 30 11/19/2021   GLUCOSE 74 11/19/2021   BUN 11 11/19/2021   CREATININE 0.73 11/19/2021   BILITOT 0.6 11/19/2021   ALKPHOS 75 06/17/2017   AST 20 11/19/2021   ALT 28 11/19/2021   PROT 7.3 11/19/2021   ALBUMIN 4.5 06/17/2017   CALCIUM 9.2 11/19/2021   GFRAA 123 01/14/2021    Speciality Comments: No specialty comments available.  Procedures:  No procedures performed Allergies: Contrast media [iodinated contrast media]   Assessment / Plan:     Visit Diagnoses: Rheumatoid arthritis with rheumatoid factor of multiple sites without organ or systems involvement ([facility])  High risk medication use  Acute pain of left shoulder  Chronic right shoulder pain  Chronic SI joint pain  Orders: No orders of the defined types were placed in this encounter.  No orders of the defined types were placed in this encounter.   Face-to-face time spent with patient was *** minutes. Greater than 50% of time was spent in counseling and coordination of care.  Follow-Up Instructions: No follow-ups on file.   Ofilia Neas, PA-C  Note - This record has been created using Dragon software.  Chart creation errors have been sought, but  may not always  have been located. Such creation errors do not reflect on  the standard of medical care.

## 2022-04-22 ENCOUNTER — Ambulatory Visit: Payer: No Typology Code available for payment source | Admitting: Physician Assistant

## 2022-04-22 ENCOUNTER — Encounter: Payer: Self-pay | Admitting: Physician Assistant

## 2022-04-22 VITALS — BP 143/86 | HR 66 | Ht 62.0 in | Wt 234.4 lb

## 2022-04-22 DIAGNOSIS — Z79899 Other long term (current) drug therapy: Secondary | ICD-10-CM

## 2022-04-22 DIAGNOSIS — M25511 Pain in right shoulder: Secondary | ICD-10-CM

## 2022-04-22 DIAGNOSIS — G8929 Other chronic pain: Secondary | ICD-10-CM

## 2022-04-22 DIAGNOSIS — M19041 Primary osteoarthritis, right hand: Secondary | ICD-10-CM

## 2022-04-22 DIAGNOSIS — M533 Sacrococcygeal disorders, not elsewhere classified: Secondary | ICD-10-CM

## 2022-04-22 DIAGNOSIS — M0579 Rheumatoid arthritis with rheumatoid factor of multiple sites without organ or systems involvement: Secondary | ICD-10-CM

## 2022-04-22 DIAGNOSIS — M19072 Primary osteoarthritis, left ankle and foot: Secondary | ICD-10-CM

## 2022-04-22 DIAGNOSIS — M19042 Primary osteoarthritis, left hand: Secondary | ICD-10-CM

## 2022-04-22 DIAGNOSIS — M19071 Primary osteoarthritis, right ankle and foot: Secondary | ICD-10-CM

## 2022-04-22 MED ORDER — PREDNISONE 5 MG PO TABS: ORAL_TABLET | ORAL | 0 refills | Status: DC

## 2022-04-22 NOTE — Progress Notes (Signed)
Office Visit Note  Patient: [name]             Date of Birth: [birth year]           MRN: [id number]             PCP: [facility] Referring: [facility] Visit Date: 04/22/2022 Occupation: '@GUAROCC' @  Subjective:  Right shoulder joint pain  History of Present Illness: [name] is a 51 y.o. female with history of seropositive rheumatoid arthritis.  Patient is currently taking methotrexate 7 tablets by mouth once weekly and folic acid 2 mg daily.  She is tolerating methotrexate without any side effects.  She states she had a recent gap in therapy for about 2 weeks but has been back on methotrexate for the past 2 weeks.  She states follow-up with therapy she experienced increased pain and swelling in both hands as well as discomfort in her right shoulder.  The pain and stiffness in her right shoulder has persisted.  She has been experiencing stiffness all day as well as pain at night in her right shoulder.  At times she feels a clicking in her right shoulder.  She has tried taking Tylenol as well as using Salonpas patches, IcyHot patches, Voltaren gel, and a lidocaine patch with minimal relief.  She states that she has very minimal and temporary relief with cortisone injections.  She does not want a repeat cortisone injection at this time.  She requested a prednisone taper to treat her current flare.    Activities of Daily Living:  Patient reports morning stiffness for all day.  Patient Reports nocturnal pain.  Difficulty dressing/grooming: Denies Difficulty climbing stairs: Denies Difficulty getting out of chair: Denies Difficulty using hands for taps, buttons, cutlery, and/or writing: Denies  Review of Systems  Constitutional:  Positive for fatigue.  HENT:  Negative for mouth sores, mouth dryness and nose dryness.   Eyes:  Negative for pain, itching and dryness.  Respiratory:  Negative for shortness of breath and difficulty breathing.    Cardiovascular:  Negative for chest pain and palpitations.  Gastrointestinal:  Positive for constipation. Negative for blood in stool and diarrhea.  Endocrine: Negative for increased urination.  Genitourinary:  Negative for difficulty urinating.  Musculoskeletal:  Positive for joint pain, joint pain, joint swelling, myalgias, morning stiffness and myalgias. Negative for muscle tenderness.  Skin:  Negative for color change, rash and redness.  Allergic/Immunologic: Negative for susceptible to infections.  Neurological:  Positive for numbness. Negative for dizziness, headaches, memory loss and weakness.  Hematological:  Negative for bruising/bleeding tendency.  Psychiatric/Behavioral:  Negative for confusion.    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   Multiple acquired skin tags 07/14/2017   Rheumatoid arthritis with rheumatoid factor of multiple sites without organ or systems involvement ([place]) 11/09/2016   High risk medication use 11/09/2016   Non compliance w medication regimen 11/09/2016   Constipation 06/16/2016   Obesity 05/16/2015    Past Medical History:  Diagnosis Date   Arthritis    BV (bacterial vaginosis)    Constipation 06/16/2016   HSV (herpes simplex virus) infection    IUD (intrauterine device) in place 03/20/2014   IUD inserted 01/23/13  Obesity    Osteoarthritis    RA (rheumatoid arthritis) ([place])    Trichomonas    Twitching     Family History  Problem Relation Age of Onset   Diabetes Mother    COPD Mother    Arthritis Mother    Hypertension Mother    Heart disease Mother    Congestive Heart Failure Mother    Hearing loss Father    Asthma Son    Cancer Maternal Grandmother        lung    Heart disease Paternal Grandmother        CHF   Past Surgical History:  Procedure Laterality Date   APPENDECTOMY     CHOLECYSTECTOMY     HAND SURGERY     OVARIAN CYST REMOVAL N/A 08/17/2007   TONSILLECTOMY     Social History   Social History Narrative   Works at a [employer].   Has children.   Does not smoke.   Eats meat fruits and vegetables.   Wears her seatbelt.      Lives at home with her son.   Right-handed.   One cup caffeine, three times per week.   Immunization History  Administered Date(s) Administered   PFIZER(Purple Top)SARS-COV-2 Vaccination 07/02/2020, 07/23/2020     Objective: Vital Signs: BP (!) 143/86 (BP Location: Left Arm, Patient Position: Sitting, Cuff Size: Large)   Pulse 66   Ht '5\' 2"'  (1.575 m)   Wt 234 lb 6.4 oz (106.3 kg)   BMI 42.87 kg/m    Physical Exam Vitals and nursing note reviewed.  Constitutional:      Appearance: She is well-developed.  HENT:     Head: Normocephalic and atraumatic.  Eyes:     Conjunctiva/sclera: Conjunctivae normal.  Cardiovascular:     Rate and Rhythm: Normal rate and regular rhythm.     Heart sounds: Normal heart sounds.  Pulmonary:     Effort: Pulmonary effort is normal.     Breath sounds: Normal breath sounds.  Abdominal:     General: Bowel sounds are normal.     Palpations: Abdomen is soft.  Musculoskeletal:     Cervical back: Normal range of motion.  Skin:    General: Skin is warm and dry.     Capillary Refill: Capillary refill takes less than 2 seconds.  Neurological:     Mental Status: She is alert and oriented to person, place, and time.  Psychiatric:        Behavior: Behavior normal.     Musculoskeletal Exam: C-spine, thoracic spine, and lumbar spine good ROM.  Right trapezius muscle tension and tenderness.  Painful ROM of the right shoulder.  Painful arc and internal rotation noted.  Anterior shoulder tenderness noted.  Elbow joints, wrist joints, MCPs, PIPs, and DIPs good ROM with no  synovitis.  Complete fist formation bilaterally.  Tenderness of the left 3rd PIP joint.  Hip joints, knee joints, and ankle joints have good ROM with no discomfort. No warmth or effusion of knee joints.  No tenderness or swelling of ankle joints.   CDAI Exam: CDAI Score: -- Patient Global: --; Provider Global: -- Swollen: 0 ; Tender: 2  Joint Exam 04/22/2022      Right  Left  Glenohumeral   Tender     PIP 3      Tender     Investigation: No additional findings.  Imaging: No results found.  Recent Labs: Lab Results  Component Value Date   WBC 8.4 11/19/2021  HGB 13.8 11/19/2021   PLT 310 11/19/2021   NA 140 11/19/2021   K 4.0 11/19/2021   CL 103 11/19/2021   CO2 30 11/19/2021   GLUCOSE 74 11/19/2021   BUN 11 11/19/2021   CREATININE 0.73 11/19/2021   BILITOT 0.6 11/19/2021   ALKPHOS 75 06/17/2017   AST 20 11/19/2021   ALT 28 11/19/2021   PROT 7.3 11/19/2021   ALBUMIN 4.5 06/17/2017   CALCIUM 9.2 11/19/2021   GFRAA 123 01/14/2021    Speciality Comments: No specialty comments available.  Procedures:  No procedures performed Allergies: Contrast media [iodinated contrast media]   Assessment / Plan:     Visit Diagnoses: Rheumatoid arthritis with rheumatoid factor of multiple sites without organ or systems involvement ([place]) - +RF, +CCP: Patient presents today with increased pain in multiple joints including the right shoulder and both hands.  She had a recent interruption in therapy and was off of methotrexate for 2 weeks due to requiring updated lab work and a follow-up visit.  While off of methotrexate she had a flare in both hands and her right shoulder.  Her hand pain and inflammation has started to subside since restarting methotrexate 2 weeks ago but she has had persistent pain in her right shoulder.  She has tried taking Tylenol as well as using Voltaren gel, Salonpas patches, lidocaine patches, and IcyHot with minimal relief.  I offered updated x-rays along with a  cortisone injection today but she declined.  According to the patient she has had very minimal and temporary response to cortisone injections in the past.  She requested a prednisone taper to treat recurrent flare.  A prescription for prednisone 20 mg tapering by 5 mg every 2 days with sent to the pharmacy.  A referral to Ortho care for further evaluation of her right shoulder joint discomfort was placed today. She will remain on methotrexate 7 tablets once weekly and folic acid 2 mg daily.  ESR, CBC, and CMP will be updated today.  She was advised to notify us if she develops more frequent flares.  She will follow-up in the office in 3 months or sooner if needed.- Plan: Sedimentation rate, predniSONE (DELTASONE) 5 MG tablet  The patient's FMLA paperwork will be updated today as requested.  High risk medication use - Methotrexate 7 tablets by mouth once weekly and folic acid 2 mg by mouth daily.  CBC and CMP within normal limits on 11/19/2021.  She is overdue to update lab work.  Orders for CBC and CMP were released.  Her next lab work will be due in September and every 3 months to monitor for drug toxicity.  Standing orders for CBC and CMP remain in place. She has not had any recent infections.  Discussed the importance of holding methotrexate if she develops signs or symptoms of an infection and to resume once the infection has completely cleared.- Plan: CBC with Differential/Platelet, COMPLETE METABOLIC PANEL WITH GFR  Primary osteoarthritis of both hands: X-rays of both hands from 01/14/2021 were consistent with rheumatoid arthritis and osteoarthritis overlap.  No erosive changes were noted at that time.  She is having some tenderness palpation over the right third PIP joint but no synovitis was noted.  Chronic right shoulder pain - She had a cortisone injection performed on 01/21/2020.  - Plan: AMB referral to orthopedics, predniSONE (DELTASONE) 5 MG tablet  Primary osteoarthritis of both feet: X-rays  of both feet from 01/14/2021 were consistent with osteoarthritis.  Both ankle joints have good  range of motion with no tenderness or inflammation at this time.  She is wearing proper fitting shoes.  Chronic SI joint pain - Resolved.  Orders: Orders Placed This Encounter  Procedures   CBC with Differential/Platelet   COMPLETE METABOLIC PANEL WITH GFR   Sedimentation rate   AMB referral to orthopedics   Meds ordered this encounter  Medications   predniSONE (DELTASONE) 5 MG tablet    Sig: Take 4 tabs po qd x 2 days, 3  tabs po qd x 2 days, 2  tabs po qd x 2 days, 1  tab po qd x 2 days    Dispense:  20 tablet    Refill:  0   Follow-Up Instructions: Return in about 3 months (around 07/23/2022) for Rheumatoid arthritis.   Ofilia Neas, PA-C  Note - This record has been created using Dragon software.  Chart creation errors have been sought, but may not always  have been located. Such creation errors do not reflect on  the standard of medical care.

## 2022-04-23 ENCOUNTER — Ambulatory Visit: Payer: No Typology Code available for payment source | Admitting: Physician Assistant

## 2022-04-23 DIAGNOSIS — G8929 Other chronic pain: Secondary | ICD-10-CM

## 2022-04-23 DIAGNOSIS — M0579 Rheumatoid arthritis with rheumatoid factor of multiple sites without organ or systems involvement: Secondary | ICD-10-CM

## 2022-04-23 DIAGNOSIS — Z79899 Other long term (current) drug therapy: Secondary | ICD-10-CM

## 2022-04-23 LAB — CBC WITH DIFFERENTIAL/PLATELET
Absolute Monocytes: 490 cells/uL (ref 200–950)
Basophils Absolute: 41 cells/uL (ref 0–200)
Basophils Relative: 0.6 %
Eosinophils Absolute: 218 cells/uL (ref 15–500)
Eosinophils Relative: 3.2 %
HCT: 42.6 % (ref 35.0–45.0)
Hemoglobin: 13.7 g/dL (ref 11.7–15.5)
Lymphs Abs: 1557 cells/uL (ref 850–3900)
MCH: 30.8 pg (ref 27.0–33.0)
MCHC: 32.2 g/dL (ref 32.0–36.0)
MCV: 95.7 fL (ref 80.0–100.0)
MPV: 10.2 fL (ref 7.5–12.5)
Monocytes Relative: 7.2 %
Neutro Abs: 4495 cells/uL (ref 1500–7800)
Neutrophils Relative %: 66.1 %
Platelets: 273 10*3/uL (ref 140–400)
RBC: 4.45 10*6/uL (ref 3.80–5.10)
RDW: 13.5 % (ref 11.0–15.0)
Total Lymphocyte: 22.9 %
WBC: 6.8 10*3/uL (ref 3.8–10.8)

## 2022-04-23 LAB — COMPLETE METABOLIC PANEL WITH GFR
AG Ratio: 1.7 (calc) (ref 1.0–2.5)
ALT: 27 U/L (ref 6–29)
AST: 22 U/L (ref 10–35)
Albumin: 4.3 g/dL (ref 3.6–5.1)
Alkaline phosphatase (APISO): 90 U/L (ref 37–153)
BUN: 9 mg/dL (ref 7–25)
CO2: 27 mmol/L (ref 20–32)
Calcium: 9.5 mg/dL (ref 8.6–10.4)
Chloride: 102 mmol/L (ref 98–110)
Creat: 0.79 mg/dL (ref 0.50–1.03)
Globulin: 2.5 g/dL (calc) (ref 1.9–3.7)
Glucose, Bld: 84 mg/dL (ref 65–99)
Potassium: 4.3 mmol/L (ref 3.5–5.3)
Sodium: 139 mmol/L (ref 135–146)
Total Bilirubin: 0.5 mg/dL (ref 0.2–1.2)
Total Protein: 6.8 g/dL (ref 6.1–8.1)
eGFR: 91 mL/min/{1.73_m2} (ref >=60)

## 2022-04-23 LAB — SEDIMENTATION RATE: Sed Rate: 6 mm/h (ref 0–20)

## 2022-04-23 NOTE — Progress Notes (Signed)
CBC and CMP WNL. ESR WNL.

## 2022-04-28 ENCOUNTER — Telehealth: Payer: Self-pay | Admitting: *Deleted

## 2022-04-28 NOTE — Telephone Encounter (Signed)
FMLA paper work faxed, copy sent to scan place, original at front desk for patient to pick up, unable to leave voicemail, mail box full.

## 2022-05-05 ENCOUNTER — Ambulatory Visit (INDEPENDENT_AMBULATORY_CARE_PROVIDER_SITE_OTHER): Payer: No Typology Code available for payment source

## 2022-05-05 ENCOUNTER — Ambulatory Visit: Payer: No Typology Code available for payment source | Admitting: Physician Assistant

## 2022-05-05 DIAGNOSIS — M25511 Pain in right shoulder: Secondary | ICD-10-CM

## 2022-05-05 MED ORDER — DICLOFENAC SODIUM 75 MG PO TBEC: 75.0000 mg | DELAYED_RELEASE_TABLET | Freq: Two times a day (BID) | ORAL | 2 refills | Status: DC | PRN

## 2022-05-05 MED ORDER — TRAMADOL HCL 50 MG PO TABS
50.0000 mg | ORAL_TABLET | Freq: Three times a day (TID) | ORAL | 0 refills | Status: DC | PRN
Start: 2022-05-05 — End: 2022-10-25

## 2022-05-05 NOTE — Progress Notes (Signed)
Office Visit Note   Patient: [name]           Date of Birth: Nov 25, 1970           MRN: [id number] Visit Date: 05/05/2022              Requested by: Ofilia Neas, PA-C [address] [place],  [place] [address] PCP: [facility]   Assessment & Plan: Visit Diagnoses:  1. Right shoulder pain, unspecified chronicity     Plan: Impression is chronic right shoulder pain without relief from previous cortisone injections.  Her exam is relatively unremarkable, however she does have moderate degenerative changes to the glenohumeral joint seen on x-ray.  I would like to get an MRI of the right shoulder to further assess for structural abnormalities.  She will follow-up with [place] once this is been completed.  Call with concerns or questions.  Follow-Up Instructions: Return for after MRI.   Orders:  Orders Placed This Encounter  Procedures   XR Shoulder Right   Meds ordered this encounter  Medications   traMADol (ULTRAM) 50 MG tablet    Sig: Take 1 tablet (50 mg total) by mouth 3 (three) times daily as needed.    Dispense:  30 tablet    Refill:  0   diclofenac (VOLTAREN) 75 MG EC tablet    Sig: Take 1 tablet (75 mg total) by mouth 2 (two) times daily as needed.    Dispense:  60 tablet    Refill:  2      Procedures: No procedures performed   Clinical Data: No additional findings.   Subjective: Chief Complaint  Patient presents with   Right Shoulder - Pain    HPI patient is a pleasant 51 year old female who comes in today with right shoulder pain for the past 2 months.  No known injury or change in activity.  The pain she has is intermittent and deep within the shoulder joint.  She has been taking occasional Norco and ibuprofen without significant relief.  She denies any paresthesias.  She does note that she has had similar pain in the past for which she is undergone subjective subacromial and glenohumeral injections without relief.  No  previous MRI.  Review of Systems as detailed in HPI.  All others reviewed and are negative.   Objective: Vital Signs: There were no vitals taken for this visit.  Physical Exam well-developed well-nourished female no acute distress.  Alert and oriented x3.  Ortho Exam right shoulder exam reveals full active range of motion with very minimal pain with internal rotation which she can get to L5.  Negative empty can.  Negative speeds negative O'Brien's.  Full strength throughout.  She is neurovascular intact distally.  Specialty Comments:  No specialty comments available.  Imaging: No results found.   PMFS History: Patient Active Problem List   Diagnosis Date Noted   Chronic migraine w/o aura w/o status migrainosus, not intractable 11/13/2020   Fasciculation 11/13/2020   Multiple thyroid nodules 12/13/2018   Hair loss 12/13/2018   Encounter for IUD insertion 01/26/2018   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   Multiple acquired skin tags 07/14/2017   Rheumatoid arthritis with rheumatoid factor of multiple sites without organ or systems involvement ([place]) 11/09/2016   High risk medication use 11/09/2016   Non compliance w medication regimen 11/09/2016   Constipation 06/16/2016   Obesity 05/16/2015   Past Medical History:  Diagnosis Date   Arthritis  BV (bacterial vaginosis)    Constipation 06/16/2016   HSV (herpes simplex virus) infection    IUD (intrauterine device) in place 03/20/2014   IUD inserted 01/23/13   Obesity    Osteoarthritis    RA (rheumatoid arthritis) ([place])    Trichomonas    Twitching     Family History  Problem Relation Age of Onset   Diabetes Mother    COPD Mother    Arthritis Mother    Hypertension Mother    Heart disease Mother    Congestive Heart Failure Mother    Hearing loss Father    Asthma Son    Cancer Maternal Grandmother        lung   Heart disease Paternal Grandmother        CHF    Past Surgical History:  Procedure Laterality Date    APPENDECTOMY     CHOLECYSTECTOMY     HAND SURGERY     OVARIAN CYST REMOVAL N/A 08/17/2007   TONSILLECTOMY     Social History   Occupational History   Occupation: Wellsite geologist  Tobacco Use   Smoking status: Never    Passive exposure: Current   Smokeless tobacco: Never  Vaping Use   Vaping Use: Never used  Substance and Sexual Activity   Alcohol use: Yes    Comment: 1 or 2 times per year   Drug use: Never   Sexual activity: Not on file

## 2022-05-19 ENCOUNTER — Ambulatory Visit
Admission: RE | Admit: 2022-05-19 | Discharge: 2022-05-19 | Disposition: A | Discharge status: Home / Self Care | Payer: No Typology Code available for payment source | Source: Ambulatory Visit | Attending: Physician Assistant | Admitting: Physician Assistant

## 2022-05-19 DIAGNOSIS — M25511 Pain in right shoulder: Secondary | ICD-10-CM

## 2022-05-23 NOTE — Progress Notes (Signed)
F/u to discuss

## 2022-05-26 ENCOUNTER — Ambulatory Visit: Payer: No Typology Code available for payment source | Admitting: Orthopaedic Surgery

## 2022-05-26 ENCOUNTER — Encounter: Payer: Self-pay | Admitting: Orthopaedic Surgery

## 2022-05-26 DIAGNOSIS — M75111 Incomplete rotator cuff tear or rupture of right shoulder, not specified as traumatic: Secondary | ICD-10-CM

## 2022-05-26 DIAGNOSIS — M19011 Primary osteoarthritis, right shoulder: Secondary | ICD-10-CM | POA: Insufficient documentation

## 2022-05-26 NOTE — Progress Notes (Signed)
Office Visit Note   Patient: MAYME PROFETA           Date of Birth: [birth year]           MRN: [id number] Visit Date: 05/26/2022              Requested by: [place], [place] [address],  [place] [address] PCP: [facility]   Assessment & Plan: Visit Diagnoses:  1. Partial nontraumatic tear of right rotator cuff   2. Arthritis of right acromioclavicular joint     Plan: MRI shows articular sided partial-thickness tearing and AC joint arthritis.  Findings of the MRI reviewed and treatment options were explained in detail and based on her options she is elected for arthroscopic extensive debridement and distal clavicle excision and subacromial decompression.  Questions encouraged and answered.  Risk benefits prognosis reviewed.  Follow-Up Instructions: No follow-ups on file.   Orders:  No orders of the defined types were placed in this encounter.  No orders of the defined types were placed in this encounter.     Procedures: No procedures performed   Clinical Data: No additional findings.   Subjective: Chief Complaint  Patient presents with   Right Shoulder - Follow-up    MRI review    HPI Patient returns today for follow-up and discussion of right shoulder MRI.  Pain is not improved.  Review of Systems  Constitutional: Negative.   HENT: Negative.    Eyes: Negative.   Respiratory: Negative.    Cardiovascular: Negative.   Endocrine: Negative.   Musculoskeletal: Negative.   Neurological: Negative.   Hematological: Negative.   Psychiatric/Behavioral: Negative.    All other systems reviewed and are negative.    Objective: Vital Signs: There were no vitals taken for this visit.  Physical Exam Vitals and nursing note reviewed.  Constitutional:      Appearance: She is well-developed.  HENT:     Head: Atraumatic.     Nose: Nose normal.  Eyes:     Extraocular Movements: Extraocular movements intact.   Cardiovascular:     Pulses: Normal pulses.  Pulmonary:     Effort: Pulmonary effort is normal.  Abdominal:     Palpations: Abdomen is soft.  Musculoskeletal:     Cervical back: Neck supple.  Skin:    General: Skin is warm.     Capillary Refill: Capillary refill takes less than 2 seconds.  Neurological:     Mental Status: She is alert. Mental status is at baseline.  Psychiatric:        Behavior: Behavior normal.        Thought Content: Thought content normal.        Judgment: Judgment normal.     Ortho Exam Examination of right shoulder shows pain with slight weakness to manual muscle testing superior cuff.  Pain with testing of the [facility] joint. Specialty Comments:  No specialty comments available.  Imaging: No results found.   PMFS History: Patient Active Problem List   Diagnosis Date Noted   Partial nontraumatic tear of right rotator cuff 05/26/2022   Arthritis of right acromioclavicular joint 05/26/2022   Chronic migraine w/o aura w/o status migrainosus, not intractable 11/13/2020   Fasciculation 11/13/2020   Multiple thyroid nodules 12/13/2018   Hair loss 12/13/2018   Encounter for IUD insertion 01/26/2018   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   Multiple acquired skin tags 07/14/2017   Rheumatoid arthritis with rheumatoid factor of multiple sites without  organ or systems involvement ([place]) 11/09/2016   High risk medication use 11/09/2016   Non compliance w medication regimen 11/09/2016   Constipation 06/16/2016   Obesity 05/16/2015   Past Medical History:  Diagnosis Date   Arthritis    BV (bacterial vaginosis)    Constipation 06/16/2016   HSV (herpes simplex virus) infection    IUD (intrauterine device) in place 03/20/2014   IUD inserted 01/23/13   Obesity    Osteoarthritis    RA (rheumatoid arthritis) ([place])    Trichomonas    Twitching     Family History  Problem Relation Age of Onset   Diabetes Mother    COPD Mother    Arthritis Mother    Hypertension  Mother    Heart disease Mother    Congestive Heart Failure Mother    Hearing loss Father    Asthma Son    Cancer Maternal Grandmother        lung   Heart disease Paternal Grandmother        CHF    Past Surgical History:  Procedure Laterality Date   APPENDECTOMY     CHOLECYSTECTOMY     HAND SURGERY     OVARIAN CYST REMOVAL N/A 08/17/2007   TONSILLECTOMY     Social History   Occupational History   Occupation: Wellsite geologist  Tobacco Use   Smoking status: Never    Passive exposure: Current   Smokeless tobacco: Never  Vaping Use   Vaping Use: Never used  Substance and Sexual Activity   Alcohol use: Yes    Comment: 1 or 2 times per year   Drug use: Never   Sexual activity: Not on file

## 2022-06-04 ENCOUNTER — Telehealth: Payer: Self-pay | Admitting: Rheumatology

## 2022-06-04 NOTE — Telephone Encounter (Signed)
Patient called the office checking to see if her FMLA paperwork had been faxed. Patient states the [employer] told her they had not received it yet. Patient requests a call back.

## 2022-06-04 NOTE — Telephone Encounter (Signed)
Spoke with patient and advised we have faxed paperwork. Patient expressed understanding.

## 2022-06-08 ENCOUNTER — Telehealth: Payer: Self-pay | Admitting: Rheumatology

## 2022-06-08 ENCOUNTER — Other Ambulatory Visit: Payer: Self-pay | Admitting: Physician Assistant

## 2022-06-08 NOTE — Telephone Encounter (Signed)
Returned call to patient and she states [employer] told her that the paperwork is missing the doctor's name, speciality and mailing information. Patient advised all information is complete on paperwork and was faxed complete. Patient requested a copy to be fax to her so she has it in front of her when she makes the return call to [facility]. Patient will call back with the fax number.

## 2022-06-08 NOTE — Telephone Encounter (Signed)
Next Visit: 07/28/2022  Last Visit: 04/22/2022  Last Fill: 03/15/2022  DX: Rheumatoid arthritis with rheumatoid factor of multiple sites without organ or systems involvement   Current Dose per office note 04/22/2022: Methotrexate 7 tablets by mouth once weekly   Labs: 04/22/2022 CBC and CMP WNL. ESR WNL.   Okay to refill MTX?

## 2022-06-08 NOTE — Telephone Encounter (Signed)
Patient called stating she just got off the phone with [employer] regarding her FMLA paperwork and there was some "key information" that was missing.  Patient requested a return call.

## 2022-06-14 ENCOUNTER — Other Ambulatory Visit: Payer: Self-pay | Admitting: Rheumatology

## 2022-06-14 NOTE — Telephone Encounter (Signed)
Next Visit: 07/28/2022   Last Visit: 04/22/2022   Last Fill: 05/04/2022   DX: Rheumatoid arthritis with rheumatoid factor of multiple sites without organ or systems involvement   Current Dose per office note 0/12/1115: folic acid 2 mg by mouth daily  Okay to refill Folic Acid?

## 2022-06-16 ENCOUNTER — Encounter (HOSPITAL_BASED_OUTPATIENT_CLINIC_OR_DEPARTMENT_OTHER): Payer: Self-pay | Admitting: Orthopaedic Surgery

## 2022-06-16 ENCOUNTER — Other Ambulatory Visit: Payer: Self-pay

## 2022-06-18 ENCOUNTER — Telehealth: Payer: Self-pay | Admitting: Orthopaedic Surgery

## 2022-06-18 NOTE — Progress Notes (Signed)
Ensure presurgery drink (drink 0915 on DOS) given with written instruction including "DOS Checklist". Pt verbalized understanding     Enhanced Recovery after Surgery for Orthopedics Enhanced Recovery after Surgery is a protocol used to improve the stress on your body and your recovery after surgery.  Patient Instructions  The night before surgery:  No food after midnight. ONLY clear liquids after midnight  The day of surgery (if you do NOT have diabetes):  Drink ONE (1) Pre-Surgery Clear Ensure as directed.   This drink was given to you during your hospital  pre-op appointment visit. The pre-op nurse will instruct you on the time to drink the  Pre-Surgery Ensure depending on your surgery time. Finish the drink at the designated time by the pre-op nurse.  Nothing else to drink after completing the  Pre-Surgery Clear Ensure.  The day of surgery (if you have diabetes): Drink ONE (1) Gatorade 2 (G2) as directed. This drink was given to you during your hospital  pre-op appointment visit.  The pre-op nurse will instruct you on the time to drink the   Gatorade 2 (G2) depending on your surgery time. Color of the Gatorade may vary. Red is not allowed. Nothing else to drink after completing the  Gatorade 2 (G2).         If you have questions, please contact your surgeon's office.

## 2022-06-18 NOTE — Telephone Encounter (Signed)
Pt submitted medical release form, [employer] is going gto fax short term forms and pt paid $25.00 cash payment to [employer]. Accepted 06/18/2022

## 2022-06-21 ENCOUNTER — Telehealth: Payer: Self-pay | Admitting: Orthopaedic Surgery

## 2022-06-21 NOTE — Telephone Encounter (Signed)
Pt submitted medical release form, Pt states FMLA faxed her forms, and pt paid $25.00 check to [employer]. Accepted 06/21/2022

## 2022-06-21 NOTE — Telephone Encounter (Signed)
[employer] forms received. To [employer]. ?

## 2022-06-23 ENCOUNTER — Other Ambulatory Visit: Payer: Self-pay

## 2022-06-23 ENCOUNTER — Encounter: Payer: Self-pay | Admitting: Orthopaedic Surgery

## 2022-06-23 ENCOUNTER — Ambulatory Visit (HOSPITAL_BASED_OUTPATIENT_CLINIC_OR_DEPARTMENT_OTHER): Payer: No Typology Code available for payment source | Admitting: Certified Registered"

## 2022-06-23 ENCOUNTER — Ambulatory Visit (HOSPITAL_BASED_OUTPATIENT_CLINIC_OR_DEPARTMENT_OTHER)
Admission: RE | Admit: 2022-06-23 | Discharge: 2022-06-23 | Disposition: A | Discharge status: Home / Self Care | Payer: No Typology Code available for payment source | Source: Ambulatory Visit | Attending: Orthopaedic Surgery | Admitting: Orthopaedic Surgery

## 2022-06-23 ENCOUNTER — Encounter (HOSPITAL_BASED_OUTPATIENT_CLINIC_OR_DEPARTMENT_OTHER): Payer: Self-pay | Admitting: Orthopaedic Surgery

## 2022-06-23 ENCOUNTER — Encounter (HOSPITAL_BASED_OUTPATIENT_CLINIC_OR_DEPARTMENT_OTHER)
Admission: RE | Disposition: A | Discharge status: Home / Self Care | Payer: Self-pay | Source: Ambulatory Visit | Attending: Orthopaedic Surgery

## 2022-06-23 DIAGNOSIS — M75111 Incomplete rotator cuff tear or rupture of right shoulder, not specified as traumatic: Secondary | ICD-10-CM

## 2022-06-23 DIAGNOSIS — E669 Obesity, unspecified: Secondary | ICD-10-CM

## 2022-06-23 DIAGNOSIS — Z6841 Body Mass Index (BMI) 40.0 and over, adult: Secondary | ICD-10-CM | POA: Insufficient documentation

## 2022-06-23 DIAGNOSIS — M25811 Other specified joint disorders, right shoulder: Secondary | ICD-10-CM | POA: Insufficient documentation

## 2022-06-23 DIAGNOSIS — M19011 Primary osteoarthritis, right shoulder: Secondary | ICD-10-CM

## 2022-06-23 DIAGNOSIS — M069 Rheumatoid arthritis, unspecified: Secondary | ICD-10-CM | POA: Insufficient documentation

## 2022-06-23 DIAGNOSIS — Z79899 Other long term (current) drug therapy: Secondary | ICD-10-CM | POA: Diagnosis not present

## 2022-06-23 DIAGNOSIS — Z01818 Encounter for other preprocedural examination: Secondary | ICD-10-CM

## 2022-06-23 DIAGNOSIS — M7541 Impingement syndrome of right shoulder: Secondary | ICD-10-CM

## 2022-06-23 LAB — POCT PREGNANCY, URINE: Preg Test, Ur: NEGATIVE

## 2022-06-23 SURGERY — SHOULDER ARTHROSCOPY WITH SUBACROMIAL DECOMPRESSION AND DISTAL CLAVICLE EXCISION
Anesthesia: General | Site: Shoulder | Laterality: Right

## 2022-06-23 MED ORDER — LIDOCAINE 2% (20 MG/ML) 5 ML SYRINGE
INTRAMUSCULAR | Status: AC
  Filled 2022-06-23: qty 20

## 2022-06-23 MED ORDER — BUPIVACAINE-EPINEPHRINE (PF) 0.25% -1:200000 IJ SOLN
INTRAMUSCULAR | Status: AC
  Filled 2022-06-23: qty 30

## 2022-06-23 MED ORDER — PHENYLEPHRINE HCL (PRESSORS) 10 MG/ML IV SOLN
INTRAVENOUS | Status: DC | PRN
  Administered 2022-06-23 (×4): 80 ug via INTRAVENOUS

## 2022-06-23 MED ORDER — PHENYLEPHRINE HCL (PRESSORS) 10 MG/ML IV SOLN
INTRAVENOUS | Status: AC
  Filled 2022-06-23: qty 1

## 2022-06-23 MED ORDER — LACTATED RINGERS IV SOLN: INTRAVENOUS | Status: DC

## 2022-06-23 MED ORDER — DEXAMETHASONE SODIUM PHOSPHATE 10 MG/ML IJ SOLN
INTRAMUSCULAR | Status: DC | PRN
  Administered 2022-06-23: 10 mg via INTRAVENOUS

## 2022-06-23 MED ORDER — HYDROMORPHONE HCL 1 MG/ML IJ SOLN
INTRAMUSCULAR | Status: AC
  Filled 2022-06-23: qty 0.5

## 2022-06-23 MED ORDER — OXYCODONE HCL 5 MG PO TABS
ORAL_TABLET | ORAL | Status: AC
  Filled 2022-06-23: qty 1

## 2022-06-23 MED ORDER — OXYCODONE HCL 5 MG PO TABS
5.0000 mg | ORAL_TABLET | Freq: Once | ORAL | Status: AC | PRN
  Administered 2022-06-23: 5 mg via ORAL

## 2022-06-23 MED ORDER — PROMETHAZINE HCL 25 MG/ML IJ SOLN: 6.2500 mg | INTRAMUSCULAR | Status: DC | PRN

## 2022-06-23 MED ORDER — MIDAZOLAM HCL 2 MG/2ML IJ SOLN
INTRAMUSCULAR | Status: AC
  Filled 2022-06-23: qty 2

## 2022-06-23 MED ORDER — SUGAMMADEX SODIUM 500 MG/5ML IV SOLN
INTRAVENOUS | Status: AC
  Filled 2022-06-23: qty 5

## 2022-06-23 MED ORDER — CEFAZOLIN SODIUM-DEXTROSE 2-4 GM/100ML-% IV SOLN
INTRAVENOUS | Status: AC
Start: 2022-06-23 — End: ?
  Filled 2022-06-23: qty 100

## 2022-06-23 MED ORDER — MEPERIDINE HCL 25 MG/ML IJ SOLN: 6.2500 mg | INTRAMUSCULAR | Status: DC | PRN

## 2022-06-23 MED ORDER — EPHEDRINE 5 MG/ML INJ
INTRAVENOUS | Status: AC
  Filled 2022-06-23: qty 10

## 2022-06-23 MED ORDER — MIDAZOLAM HCL 5 MG/5ML IJ SOLN
INTRAMUSCULAR | Status: DC | PRN
  Administered 2022-06-23: 2 mg via INTRAVENOUS

## 2022-06-23 MED ORDER — ONDANSETRON HCL 4 MG/2ML IJ SOLN
INTRAMUSCULAR | Status: AC
  Filled 2022-06-23: qty 2

## 2022-06-23 MED ORDER — PHENYLEPHRINE HCL-NACL 20-0.9 MG/250ML-% IV SOLN
INTRAVENOUS | Status: DC | PRN
  Administered 2022-06-23: 25 ug/min via INTRAVENOUS

## 2022-06-23 MED ORDER — SODIUM CHLORIDE 0.9 % IR SOLN
Status: DC | PRN
  Administered 2022-06-23: 5000 mL

## 2022-06-23 MED ORDER — HYDROMORPHONE HCL 1 MG/ML IJ SOLN
0.2500 mg | INTRAMUSCULAR | Status: DC | PRN
  Administered 2022-06-23 (×2): 0.5 mg via INTRAVENOUS

## 2022-06-23 MED ORDER — FENTANYL CITRATE (PF) 100 MCG/2ML IJ SOLN
INTRAMUSCULAR | Status: DC | PRN
Start: 2022-06-23 — End: 2022-06-23
  Administered 2022-06-23: 100 ug via INTRAVENOUS

## 2022-06-23 MED ORDER — FENTANYL CITRATE (PF) 100 MCG/2ML IJ SOLN
INTRAMUSCULAR | Status: AC
  Filled 2022-06-23: qty 2

## 2022-06-23 MED ORDER — ROCURONIUM BROMIDE 10 MG/ML (PF) SYRINGE
PREFILLED_SYRINGE | INTRAVENOUS | Status: AC
  Filled 2022-06-23: qty 10

## 2022-06-23 MED ORDER — ROCURONIUM BROMIDE 100 MG/10ML IV SOLN
INTRAVENOUS | Status: DC | PRN
  Administered 2022-06-23: 60 mg via INTRAVENOUS

## 2022-06-23 MED ORDER — OXYCODONE-ACETAMINOPHEN 5-325 MG PO TABS: 1.0000 | ORAL_TABLET | Freq: Two times a day (BID) | ORAL | 0 refills | Status: DC | PRN

## 2022-06-23 MED ORDER — DEXAMETHASONE SODIUM PHOSPHATE 10 MG/ML IJ SOLN
INTRAMUSCULAR | Status: AC
  Filled 2022-06-23: qty 3

## 2022-06-23 MED ORDER — SUGAMMADEX SODIUM 500 MG/5ML IV SOLN
INTRAVENOUS | Status: DC | PRN
  Administered 2022-06-23: 300 mg via INTRAVENOUS

## 2022-06-23 MED ORDER — PROPOFOL 10 MG/ML IV BOLUS
INTRAVENOUS | Status: AC
  Filled 2022-06-23: qty 20

## 2022-06-23 MED ORDER — EPHEDRINE SULFATE (PRESSORS) 50 MG/ML IJ SOLN
INTRAMUSCULAR | Status: DC | PRN
  Administered 2022-06-23: 10 mg via INTRAVENOUS

## 2022-06-23 MED ORDER — ONDANSETRON HCL 4 MG PO TABS: 4.0000 mg | ORAL_TABLET | Freq: Three times a day (TID) | ORAL | 0 refills | Status: DC | PRN

## 2022-06-23 MED ORDER — CEFAZOLIN SODIUM-DEXTROSE 2-4 GM/100ML-% IV SOLN
2.0000 g | INTRAVENOUS | Status: AC
  Administered 2022-06-23: 2 g via INTRAVENOUS

## 2022-06-23 MED ORDER — OXYCODONE HCL 5 MG/5ML PO SOLN: 5.0000 mg | Freq: Once | ORAL | Status: AC | PRN

## 2022-06-23 MED ORDER — LIDOCAINE 2% (20 MG/ML) 5 ML SYRINGE
INTRAMUSCULAR | Status: DC | PRN
  Administered 2022-06-23: 100 mg via INTRAVENOUS

## 2022-06-23 MED ORDER — BUPIVACAINE-EPINEPHRINE 0.25% -1:200000 IJ SOLN
INTRAMUSCULAR | Status: DC | PRN
  Administered 2022-06-23: 20 mL

## 2022-06-23 MED ORDER — PROPOFOL 10 MG/ML IV BOLUS
INTRAVENOUS | Status: DC | PRN
  Administered 2022-06-23: 200 mg via INTRAVENOUS

## 2022-06-23 SURGICAL SUPPLY — 58 items
BENZOIN TINCTURE PRP APPL 2/3 (GAUZE/BANDAGES/DRESSINGS) IMPLANT
BLADE EXCALIBUR 4.0X13 (MISCELLANEOUS) IMPLANT
BLADE SURG 15 STRL LF DISP TIS (BLADE) IMPLANT
BLADE SURG 15 STRL SS (BLADE)
BURR OVAL 8 FLU 4.0X13 (MISCELLANEOUS) ×2 IMPLANT
CANNULA 5.75X71 LONG (CANNULA) ×2 IMPLANT
CANNULA SHOULDER 7CM (CANNULA) IMPLANT
CANNULA TWIST IN 8.25X7CM (CANNULA) ×1 IMPLANT
CLSR STERI-STRIP ANTIMIC 1/2X4 (GAUZE/BANDAGES/DRESSINGS) IMPLANT
COOLER ICEMAN CLASSIC (MISCELLANEOUS) ×2 IMPLANT
DISSECTOR  3.8MM X 13CM (MISCELLANEOUS) ×2
DISSECTOR 3.8MM X 13CM (MISCELLANEOUS) ×1 IMPLANT
DRAPE IMP U-DRAPE 54X76 (DRAPES) ×2 IMPLANT
DRAPE INCISE IOBAN 66X45 STRL (DRAPES) ×2 IMPLANT
DRAPE SHOULDER BEACH CHAIR (DRAPES) ×2 IMPLANT
DRAPE STERI 35X30 U-POUCH (DRAPES) ×2 IMPLANT
DRAPE SURG 17X23 STRL (DRAPES) ×2 IMPLANT
DRAPE U-SHAPE 47X51 STRL (DRAPES) ×2 IMPLANT
DRSG PAD ABDOMINAL 8X10 ST (GAUZE/BANDAGES/DRESSINGS) ×4 IMPLANT
DURAPREP 26ML APPLICATOR (WOUND CARE) ×4 IMPLANT
ELECT REM PT RETURN 9FT ADLT (ELECTROSURGICAL)
ELECTRODE REM PT RTRN 9FT ADLT (ELECTROSURGICAL) IMPLANT
GAUZE SPONGE 4X4 12PLY STRL (GAUZE/BANDAGES/DRESSINGS) ×4 IMPLANT
GAUZE XEROFORM 1X8 LF (GAUZE/BANDAGES/DRESSINGS) ×2 IMPLANT
GLOVE ECLIPSE 7.0 STRL STRAW (GLOVE) ×4 IMPLANT
GLOVE INDICATOR 7.0 STRL GRN (GLOVE) ×2 IMPLANT
GLOVE INDICATOR 7.5 STRL GRN (GLOVE) ×2 IMPLANT
GLOVE SURG SYN 7.5  E (GLOVE) ×2
GLOVE SURG SYN 7.5 E (GLOVE) ×1 IMPLANT
GLOVE SURG SYN 7.5 PF PI (GLOVE) ×1 IMPLANT
GOWN STRL REIN XL XLG (GOWN DISPOSABLE) ×4 IMPLANT
GOWN STRL REUS W/ TWL LRG LVL3 (GOWN DISPOSABLE) ×1 IMPLANT
GOWN STRL REUS W/TWL LRG LVL3 (GOWN DISPOSABLE) ×2
MANIFOLD NEPTUNE II (INSTRUMENTS) ×2 IMPLANT
NDL SCORPION MULTI FIRE (NEEDLE) IMPLANT
NEEDLE SCORPION MULTI FIRE (NEEDLE) IMPLANT
PACK ARTHROSCOPY DSU (CUSTOM PROCEDURE TRAY) ×2 IMPLANT
PACK BASIN DAY SURGERY FS (CUSTOM PROCEDURE TRAY) ×2 IMPLANT
PAD COLD SHLDR WRAP-ON (PAD) ×2 IMPLANT
PORT APPOLLO RF 90DEGREE MULTI (SURGICAL WAND) ×2 IMPLANT
SHEET MEDIUM DRAPE 40X70 STRL (DRAPES) IMPLANT
SLEEVE SCD COMPRESS KNEE MED (STOCKING) ×2 IMPLANT
SLING ARM FOAM STRAP LRG (SOFTGOODS) ×1 IMPLANT
SPIKE FLUID TRANSFER (MISCELLANEOUS) IMPLANT
SUPPORT WRAP ARM LG (MISCELLANEOUS) IMPLANT
SUT ETHILON 3 0 PS 1 (SUTURE) ×2 IMPLANT
SUT FIBERWIRE #2 38 T-5 BLUE (SUTURE)
SUT TIGER TAPE 7 IN WHITE (SUTURE) IMPLANT
SUTURE FIBERWR #2 38 T-5 BLUE (SUTURE) IMPLANT
SUTURE TAPE 1.3 40 TPR END (SUTURE) IMPLANT
SUTURE TAPE TIGERLINK 1.3MM BL (SUTURE) IMPLANT
SUTURETAPE 1.3 40 TPR END (SUTURE)
SUTURETAPE TIGERLINK 1.3MM BL (SUTURE)
SYR 50ML LL SCALE MARK (SYRINGE) ×2 IMPLANT
TAPE FIBER 2MM 7IN #2 BLUE (SUTURE) IMPLANT
TOWEL GREEN STERILE FF (TOWEL DISPOSABLE) ×2 IMPLANT
TUBE CONNECTING 20X1/4 (TUBING) ×2 IMPLANT
TUBING ARTHROSCOPY IRRIG 16FT (MISCELLANEOUS) ×2 IMPLANT

## 2022-06-23 NOTE — H&P (Signed)
PREOPERATIVE H&P  Chief Complaint: right shoulder partial rotator cuff tear, acromioclavicular arthritis  HPI: [name] is a 51 y.o. female who presents for surgical treatment of right shoulder partial rotator cuff tear, acromioclavicular arthritis.  She denies any changes in medical history.  Past Medical History:  Diagnosis Date   Arthritis    BV (bacterial vaginosis)    Constipation 06/16/2016   HSV (herpes simplex virus) infection    IUD (intrauterine device) in place 03/20/2014   IUD inserted 01/23/13   Obesity    Osteoarthritis    RA (rheumatoid arthritis) ([place])    Trichomonas    Twitching    Past Surgical History:  Procedure Laterality Date   APPENDECTOMY     CHOLECYSTECTOMY     HAND SURGERY     OVARIAN CYST REMOVAL N/A 08/17/2007   TONSILLECTOMY     Social History   Socioeconomic History   Marital status: Single    Spouse name: Not on file   Number of children: 1   Years of education: some college   Highest education level: Not on file  Occupational History   Occupation: Wellsite geologist  Tobacco Use   Smoking status: Never    Passive exposure: Current   Smokeless tobacco: Never  Vaping Use   Vaping Use: Never used  Substance and Sexual Activity   Alcohol use: Yes    Comment: 1 or 2 times per year   Drug use: Never   Sexual activity: Not on file  Other Topics Concern   Not on file  Social History Narrative   Works at a [employer].   Has children.   Does not smoke.   Eats meat fruits and vegetables.   Wears her seatbelt.      Lives at home with her son.   Right-handed.   One cup caffeine, three times per week.   Social Determinants of Health   Financial Resource Strain: Not on file  Food Insecurity: Not on file  Transportation Needs: Not on file  Physical Activity: Inactive (10/06/2017)   Exercise Vital Sign    Days of Exercise per Week: 0 days    Minutes of Exercise per Session: 0 min  Stress: No Stress Concern Present  (10/06/2017)   [place]    Feeling of Stress : Only a little  Social Connections: Not on file   Family History  Problem Relation Age of Onset   Diabetes Mother    COPD Mother    Arthritis Mother    Hypertension Mother    Heart disease Mother    Congestive Heart Failure Mother    Hearing loss Father    Asthma Son    Cancer Maternal Grandmother        lung   Heart disease Paternal Grandmother        CHF   Allergies  Allergen Reactions   Contrast Media [Iodinated Contrast Media] Swelling    Swelling of the eyes   Prior to Admission medications   Medication Sig Start Date End Date Taking? Authorizing Provider  cholecalciferol (VITAMIN D3) 25 MCG (1000 UNIT) tablet Take 1,000 Units by mouth daily.   Yes [provider]  Cinnamon 500 MG TABS Take by mouth daily.   Yes [provider]  diclofenac (VOLTAREN) 75 MG EC tablet Take 1 tablet (75 mg total) by mouth 2 (two) times daily as needed. 05/05/22  Yes Aundra Dubin, PA-C  Flaxseed, Linseed, (  FLAX SEED OIL) 1000 MG CAPS Take by mouth daily.   Yes [provider]  folic acid (FOLVITE) 1 MG tablet TAKE 2 TABLETS BY MOUTH EVERY DAY 06/14/22  Yes Ofilia Neas, PA-C  MAGNESIUM PO Take 800 mg by mouth daily.   Yes [provider]  methotrexate (RHEUMATREX) 2.5 MG tablet TAKE 7 TABLETS BY MOUTH ONCE WEEKLY. CAUTION:CHEMOTHERAPY. PROTECT FROM LIGHT. 06/08/22  Yes Ofilia Neas, PA-C  Multiple Vitamins-Calcium (ONE-A-DAY WOMENS PO) Take by mouth daily.   Yes [provider]  Omega-3 Fatty Acids (FISH OIL PO) Take 2,000 mg by mouth.   Yes [provider]  traMADol (ULTRAM) 50 MG tablet Take 1 tablet (50 mg total) by mouth 3 (three) times daily as needed. 05/05/22  Yes Aundra Dubin, PA-C  Biotin w/ Vitamins C & E (HAIR/SKIN/NAILS PO) Take by mouth daily.    [provider]  Chromium Picolinate (CHROMIUM  PICOLATE PO) Take by mouth daily.    [provider]  diclofenac Sodium (VOLTAREN) 1 % GEL Apply 2-4 grams to affected joint 4 times daily as needed. 06/12/21   Ofilia Neas, PA-C  levonorgestrel (MIRENA) 20 MCG/24HR IUD 1 each by Intrauterine route once.    [provider]  meclizine (ANTIVERT) 25 MG tablet Take 1 tablet (25 mg total) by mouth 3 (three) times daily as needed for dizziness. 11/28/19   Vanessa Kick, MD  Misc Natural Products (TART CHERRY ADVANCED PO) Take by mouth daily.    [provider]  ondansetron (ZOFRAN ODT) 4 MG disintegrating tablet Take 1 tablet (4 mg total) by mouth every 8 (eight) hours as needed. 11/13/20   Marcial Pacas, MD  PHENTERMINE HCL PO Take by mouth daily.    [provider]  SUMAtriptan (IMITREX) 50 MG tablet Take 50 mg by mouth as needed for migraine. May repeat in 2 hours if headache persists or recurs.    [provider]     Positive ROS: All other systems have been reviewed and were otherwise negative with the exception of those mentioned in the HPI and as above.  Physical Exam: General: Alert, no acute distress Cardiovascular: No pedal edema Respiratory: No cyanosis, no use of accessory musculature GI: abdomen soft Skin: No lesions in the area of chief complaint Neurologic: Sensation intact distally Psychiatric: Patient is competent for consent with normal mood and affect Lymphatic: no lymphedema  MUSCULOSKELETAL: exam stable  Assessment: right shoulder partial rotator cuff tear, acromioclavicular arthritis  Plan: Plan for Procedure(s): RIGHT SHOULDER ARTHROSCOPY, EXTENSIVE DEBRIDEMENT, SUBACROMIAL DECOMPRESSION, DISTAL CLAVICLE EXCISION  The risks benefits and alternatives were discussed with the patient including but not limited to the risks of nonoperative treatment, versus surgical intervention including infection, bleeding, nerve injury,  blood clots, cardiopulmonary complications, morbidity,  mortality, among others, and they were willing to proceed.   Eduard Roux, MD 06/23/2022 10:24 AM

## 2022-06-23 NOTE — Anesthesia Procedure Notes (Signed)
Procedure Name: Intubation Date/Time: 06/23/2022 12:14 PM  Performed by: Lavonia Dana, CRNAPre-anesthesia Checklist: Patient identified, Emergency Drugs available, Suction available and Patient being monitored Patient Re-evaluated:Patient Re-evaluated prior to induction Oxygen Delivery Method: Circle system utilized Preoxygenation: Pre-oxygenation with 100% oxygen Induction Type: IV induction Ventilation: Mask ventilation without difficulty and Oral airway inserted - appropriate to patient size Laryngoscope Size: Mac and 3 Grade View: Grade II Tube type: Oral Tube size: 7.0 mm Number of attempts: 1 Airway Equipment and Method: Stylet, Oral airway and Bite block Placement Confirmation: ETT inserted through vocal cords under direct vision, positive ETCO2 and breath sounds checked- equal and bilateral Secured at: 22 cm Tube secured with: Tape Dental Injury: Teeth and Oropharynx as per pre-operative assessment

## 2022-06-23 NOTE — Transfer of Care (Signed)
Immediate Anesthesia Transfer of Care Note  Patient: [name]  Procedure(s) Performed: RIGHT SHOULDER ARTHROSCOPY, EXTENSIVE DEBRIDEMENT, SUBACROMIAL DECOMPRESSION, DISTAL CLAVICLE EXCISION (Right: Shoulder)  Patient Location: PACU  Anesthesia Type:General  Level of Consciousness: drowsy  Airway & Oxygen Therapy: Patient Spontanous Breathing and Patient connected to face mask oxygen  Post-op Assessment: Report given to RN and Post -op Vital signs reviewed and stable  Post vital signs: Reviewed and stable  Last Vitals:   RR: 17 Vitals Value Taken Time  BP 135/71 06/23/22 1330  Temp    Pulse 86 06/23/22 1331  Resp    SpO2 100 % 06/23/22 1331  Vitals shown include unvalidated device data.  Last Pain:  Vitals:   06/23/22 0958  TempSrc: Oral  PainSc: 0-No pain         Complications: No notable events documented.

## 2022-06-23 NOTE — Discharge Instructions (Addendum)
Post-operative patient instructions  Shoulder Arthroscopy   Ice:  Place intermittent ice or cooler pack over your shoulder, 30 minutes on and 30 minutes off.  Continue this for the first 72 hours after surgery, then save ice for use after therapy sessions or on more active days.   Weight:  You may bear weight on your arm as your symptoms allow. Motion:  Perform gentle shoulder motion as tolerated Dressing:  Perform 1st dressing change at 2 days postoperative. A moderate amount of blood tinged drainage is to be expected.  So if you bleed through the dressing on the first or second day or if you have fevers, it is fine to change the dressing/check the wounds early and redress wound.  If it bleeds through again, or if the incisions are leaking frank blood, please call the office. May change dressing every 1-2 days thereafter to help watch wounds. Can purchase Tegaderm (or 73M Nexcare) water resistant dressings at local pharmacy / [facility]. Shower:  Light shower is ok after 2 days.  Please take shower, NO bath. Recover with gauze and ace wrap to help keep wounds protected.   Pain medication:  A narcotic pain medication has been prescribed.  Take as directed.  Typically you need narcotic pain medication more regularly during the first 3 to 5 days after surgery.  Decrease your use of the medication as the pain improves.  Narcotics can sometimes cause constipation, even after a few doses.  If you have problems with constipation, you can take an over the counter stool softener or light laxative.  If you have persistent problems, please notify your physician's office. Physical therapy: Additional activity guidelines to be provided by your physician or physical therapist at follow-up visits.  Driving: Do not recommend driving x 2 weeks post surgical, especially if surgery performed on right side. Should not drive while taking narcotic pain medications. It typically takes at least 2 weeks to restore sufficient  neuromuscular function for normal reaction times for driving safety.  Call [phone] for questions or problems. Evenings you will be forwarded to the hospital operator.  Ask for the orthopaedic physician on call. Please call if you experience:    Redness, foul smelling, or persistent drainage from the surgical site  worsening shoulder pain and swelling not responsive to medication  any calf pain and or swelling of the lower leg  temperatures greater than 101.5 F other questions or concerns   Thank you for allowing [place] to be a part of your care.  Post Anesthesia Home Care Instructions  Activity: Get plenty of rest for the remainder of the day. A responsible individual must stay with you for 24 hours following the procedure.  For the next 24 hours, DO NOT: -Drive a car -Paediatric nurse -Drink alcoholic beverages -Take any medication unless instructed by your physician -Make any legal decisions or sign important papers.  Meals: Start with liquid foods such as gelatin or soup. Progress to regular foods as tolerated. Avoid greasy, spicy, heavy foods. If nausea and/or vomiting occur, drink only clear liquids until the nausea and/or vomiting subsides. Call your physician if vomiting continues.  Special Instructions/Symptoms: Your throat may feel dry or sore from the anesthesia or the breathing tube placed in your throat during surgery. If this causes discomfort, gargle with warm salt water. The discomfort should disappear within 24 hours.  If you had a scopolamine patch placed behind your ear for the management of post- operative nausea and/or vomiting:  1. The  medication in the patch is effective for 72 hours, after which it should be removed.  Wrap patch in a tissue and discard in the trash. Wash hands thoroughly with soap and water. 2. You may remove the patch earlier than 72 hours if you experience unpleasant side effects which may include dry mouth, dizziness or visual  disturbances. 3. Avoid touching the patch. Wash your hands with soap and water after contact with the patch.

## 2022-06-23 NOTE — Op Note (Signed)
   Date of Surgery: 06/23/2022  INDICATIONS: The patient is a 51 year old female with right shoulder pain that has failed conservative treatment;  The patient did consent to the procedure after discussion of the risks and benefits.  DIAGNOSES: Right shoulder, AC arthritis, subacromial impingement, and partial thickness rotator cuff tear .  POST-OPERATIVE DIAGNOSIS: same  PROCEDURE: Arthroscopic extensive debridement - 29823 Subdeltoid Bursa, Supraspinatus Tendon, Anterior Labrum, Superior Labrum, and Posterior Labrum Arthroscopic distal clavicle excision - 33354 Arthroscopic subacromial decompression - 56256   OPERATIVE FINDING: Exam under anesthesia: Normal Articular space: Normal Chondral surfaces: Normal Biceps: Normal Subscapularis: Intact  Supraspinatus: Partial thickness tear Infraspinatus: Intact   SURGEON: N. Eduard Roux, M.D.  ASSIST: Madalyn Rob, PA-C  ANESTHESIA:  general, regional  IV FLUIDS AND URINE: See anesthesia.  ESTIMATED BLOOD LOSS: minimal mL.  IMPLANTS: None  COMPLICATIONS: None.  DESCRIPTION OF PROCEDURE: The patient was brought to the operating room and placed supine on the operating table.  The patient had been signed prior to the procedure and this was documented. The patient had the anesthesia placed by the anesthesiologist.  A time-out was performed to confirm that this was the correct patient, site, side and location. The patient did receive antibiotics prior to the incision and was re-dosed during the procedure as needed at indicated intervals.  The patient was then positioned into the beach chair position with all bony prominences well padded and neutral C spine. The patient had the operative extremity prepped and draped in the standard surgical fashion.    Incisions were made for shoulder arthroscopy portals.  Diagnostic shoulder arthroscopy ensued.  No synovitis was encountered.  Biceps tendon was unremarkable.  There was some  degenerative fraying of the superior and posterior labrum without an overt tear.  This was gently debrided back to stable margins.  The subscapularis tendon was unremarkable.  Chondral surfaces were unremarkable.  There was partial-thickness articular surface tear of the supraspinatus approximately 20% of the thickness.  This was gently debrided.  Arthroscope was then repositioned into the subacromial space.  Bursectomy was performed there was some very mild tendinopathy of the superior cuff.  No full-thickness tears.  CA ligament was released from the anterior acromion and acromioplasty was performed.  Distal clavicle was then identified and distal clavicle excision was performed taking approximately 5 mm.  Excess fluid was expressed from the shoulder incisions closed with nylon.  Sterile dressings were applied.  Shoulder sling was placed.  Patient tolerated procedure well no immediate complications.  Tawanna Cooler, my PA, was a medical necessity for the entirety of the surgery including opening, closing, limb positioning, retracting, exposing, and repairing.  POSTOPERATIVE PLAN: Patient will be discharged home and follow-up in the office in a week.  She may begin shoulder range of motion as tolerated.  Sling for comfort.  Suture removal next week.  Azucena Cecil, MD [facility] 1:12 PM

## 2022-06-23 NOTE — Anesthesia Preprocedure Evaluation (Signed)
Anesthesia Evaluation  Patient identified by MRN, date of birth, ID band Patient awake    Reviewed: Allergy & Precautions, NPO status , Patient's Chart, lab work & pertinent test results  Airway Mallampati: III  TM Distance: >3 FB Neck ROM: Full    Dental no notable dental hx. (+) Dental Advisory Given   Pulmonary neg pulmonary ROS,    Pulmonary exam normal breath sounds clear to auscultation       Cardiovascular negative cardio ROS Normal cardiovascular exam Rhythm:Regular Rate:Normal     Neuro/Psych  Headaches,    GI/Hepatic negative GI ROS, Neg liver ROS,   Endo/Other  negative endocrine ROS  Renal/GU negative Renal ROS     Musculoskeletal  (+) Arthritis ,   Abdominal (+) + obese,   Peds  Hematology negative hematology ROS (+)   Anesthesia Other Findings   Reproductive/Obstetrics negative OB ROS                            Anesthesia Physical Anesthesia Plan  ASA: 3  Anesthesia Plan: General   Post-op Pain Management: Ofirmev IV (intra-op)* and Toradol IV (intra-op)*   Induction: Intravenous  PONV Risk Score and Plan: 4 or greater and Ondansetron, Dexamethasone, Treatment may vary due to age or medical condition and Midazolam  Airway Management Planned: Oral ETT  Additional Equipment: None  Intra-op Plan:   Post-operative Plan: Extubation in OR  Informed Consent: I have reviewed the patients History and Physical, chart, labs and discussed the procedure including the risks, benefits and alternatives for the proposed anesthesia with the patient or authorized representative who has indicated his/her understanding and acceptance.     Dental advisory given  Plan Discussed with: CRNA  Anesthesia Plan Comments: (Discussed preop peripheral nerve block. Patient declined. I offered possibility of post op block if her pain was poorly controlled. I discussed the risks of nerve  block, including but not limited to nerve injury, weakness, bleeding and infection.)        Anesthesia Quick Evaluation

## 2022-06-24 HISTORY — PX: SHOULDER ARTHROSCOPY: SHX128

## 2022-06-24 NOTE — Anesthesia Postprocedure Evaluation (Signed)
Anesthesia Post Note  Patient: [name]  Procedure(s) Performed: RIGHT SHOULDER ARTHROSCOPY, EXTENSIVE DEBRIDEMENT, SUBACROMIAL DECOMPRESSION, DISTAL CLAVICLE EXCISION (Right: Shoulder)     Patient location during evaluation: PACU Anesthesia Type: General Level of consciousness: sedated and patient cooperative Pain management: pain level controlled Vital Signs Assessment: post-procedure vital signs reviewed and stable Respiratory status: spontaneous breathing Cardiovascular status: stable Anesthetic complications: no   No notable events documented.  Last Vitals:  Vitals:   06/23/22 1438 06/23/22 1450  BP:  129/77  Pulse: 76 79  Resp: 13 16  Temp:  37.1 C  SpO2: 97% 100%    Last Pain:  Vitals:   06/23/22 1450  TempSrc:   PainSc: [place]

## 2022-06-25 ENCOUNTER — Telehealth: Payer: Self-pay

## 2022-06-25 NOTE — Telephone Encounter (Signed)
Pt had shoulder surgery on Wednesday and wants to know when she should remove her dressing? Please call.

## 2022-06-25 NOTE — Telephone Encounter (Signed)
Please tell her to refer to the discharge instructions thank you.

## 2022-06-30 ENCOUNTER — Ambulatory Visit (INDEPENDENT_AMBULATORY_CARE_PROVIDER_SITE_OTHER): Payer: No Typology Code available for payment source | Admitting: Physician Assistant

## 2022-06-30 DIAGNOSIS — Z9889 Other specified postprocedural states: Secondary | ICD-10-CM

## 2022-06-30 MED ORDER — HYDROCODONE-ACETAMINOPHEN 5-325 MG PO TABS: 1.0000 | ORAL_TABLET | Freq: Three times a day (TID) | ORAL | 0 refills | Status: DC | PRN

## 2022-06-30 NOTE — Progress Notes (Signed)
Post-Op Visit Note   Patient: [name]           Date of Birth: 1971-10-13           MRN: [id number] Visit Date: 06/30/2022 PCP: Cory Munch, PA-C   Assessment & Plan:  Chief Complaint:  Chief Complaint  Patient presents with   Right Shoulder - Pain   Visit Diagnoses:  1. S/P arthroscopy of right shoulder     Plan: Patient is a pleasant 51 year old female who comes in today 1 week status post right shoulder arthroscopy debridement, decompression 06/23/2022.  She has been doing well.  She has a slight discomfort but nothing more.  Examination of the right shoulder reveals well-healed surgical portals with nylon sutures in place.  No evidence of infection or cellulitis.  She is neurovascular intact distally.  Sutures were removed today and Steri-Strips applied.  Referral for outpatient physical therapy has been sent in.  Intraoperative pictures reviewed.  She will follow-up with [place] in 5 weeks for recheck.  Call with concerns or questions.  Follow-Up Instructions: Return in about 5 weeks (around 08/04/2022).   Orders:  No orders of the defined types were placed in this encounter.  No orders of the defined types were placed in this encounter.   Imaging: No new imaging  PMFS History: Patient Active Problem List   Diagnosis Date Noted   Partial nontraumatic tear of right rotator cuff 05/26/2022   Arthritis of right acromioclavicular joint 05/26/2022   Chronic migraine w/o aura w/o status migrainosus, not intractable 11/13/2020   Fasciculation 11/13/2020   Multiple thyroid nodules 12/13/2018   Hair loss 12/13/2018   Encounter for IUD insertion 01/26/2018   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   Multiple acquired skin tags 07/14/2017   Rheumatoid arthritis with rheumatoid factor of multiple sites without organ or systems involvement ([place]) 11/09/2016   High risk medication use 11/09/2016   Non compliance w medication regimen 11/09/2016   Constipation 06/16/2016    Obesity 05/16/2015   Past Medical History:  Diagnosis Date   Arthritis    BV (bacterial vaginosis)    Constipation 06/16/2016   HSV (herpes simplex virus) infection    IUD (intrauterine device) in place 03/20/2014   IUD inserted 01/23/13   Obesity    Osteoarthritis    RA (rheumatoid arthritis) ([place])    Trichomonas    Twitching     Family History  Problem Relation Age of Onset   Diabetes Mother    COPD Mother    Arthritis Mother    Hypertension Mother    Heart disease Mother    Congestive Heart Failure Mother    Hearing loss Father    Asthma Son    Cancer Maternal Grandmother        lung   Heart disease Paternal Grandmother        CHF    Past Surgical History:  Procedure Laterality Date   APPENDECTOMY     CHOLECYSTECTOMY     HAND SURGERY     OVARIAN CYST REMOVAL N/A 08/17/2007   TONSILLECTOMY     Social History   Occupational History   Occupation: Wellsite geologist  Tobacco Use   Smoking status: Never    Passive exposure: Current   Smokeless tobacco: Never  Vaping Use   Vaping Use: Never used  Substance and Sexual Activity   Alcohol use: Yes    Comment: 1 or 2 times per year   Drug use: Never   Sexual  activity: Not on file

## 2022-07-14 NOTE — Progress Notes (Signed)
Office Visit Note  Patient: [name]             Date of Birth: [birth year]           MRN: [id number]             PCP: Ginger Organ Referring: [facility]* Visit Date: 07/28/2022 Occupation: '@GUAROCC'$ @  Subjective:  Right knee pain  History of Present Illness: [name] is a 51 y.o. female with history of seropositive rheumatoid arthritis.  She underwent right shoulder joint arthroscopic surgery by Dr. Sherrian Divers for rotator cuff tear on June 24, 2022.  She is gradually recovering from it.  She states she could not get a physical therapy appointment.  She has been having some pain and stiffness in her hands and her right knee joint.  She came off methotrexate for the surgery and then resumed methotrexate.  None of the other joints are painful.  Activities of Daily Living:  Patient reports morning stiffness for 0 minutes.   Patient Denies nocturnal pain.  Difficulty dressing/grooming: Denies Difficulty climbing stairs: Denies Difficulty getting out of chair: Denies Difficulty using hands for taps, buttons, cutlery, and/or writing: Denies  Review of Systems  Constitutional:  Negative for fatigue.  HENT:  Negative for mouth sores and mouth dryness.        Nose sore  Eyes:  Negative for dryness.  Respiratory:  Negative for shortness of breath.   Cardiovascular:  Negative for chest pain and palpitations.  Gastrointestinal:  Positive for constipation. Negative for blood in stool and diarrhea.  Endocrine: Negative for increased urination.  Genitourinary:  Negative for involuntary urination.  Musculoskeletal:  Positive for joint pain, joint pain and joint swelling. Negative for gait problem, myalgias, muscle weakness, morning stiffness, muscle tenderness and myalgias.  Skin:  Negative for color change, rash, hair loss and sensitivity to sunlight.  Allergic/Immunologic: Negative for susceptible to infections.  Neurological:  Negative for dizziness and  headaches.  Hematological:  Negative for swollen glands.  Psychiatric/Behavioral:  Negative for depressed mood and sleep disturbance. The patient is not nervous/anxious.     PMFS History:  Patient Active Problem List   Diagnosis Date Noted   Partial nontraumatic tear of right rotator cuff 05/26/2022   Arthritis of right acromioclavicular joint 05/26/2022   Chronic migraine w/o aura w/o status migrainosus, not intractable 11/13/2020   Fasciculation 11/13/2020   Multiple thyroid nodules 12/13/2018   Hair loss 12/13/2018   Encounter for IUD insertion 01/26/2018   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   Multiple acquired skin tags 07/14/2017   Rheumatoid arthritis with rheumatoid factor of multiple sites without organ or systems involvement ([place]) 11/09/2016   High risk medication use 11/09/2016   Non compliance w medication regimen 11/09/2016   Constipation 06/16/2016   Obesity 05/16/2015    Past Medical History:  Diagnosis Date   Arthritis    BV (bacterial vaginosis)    Constipation 06/16/2016   HSV (herpes simplex virus) infection    IUD (intrauterine device) in place 03/20/2014   IUD inserted 01/23/13   Obesity    Osteoarthritis    RA (rheumatoid arthritis) ([place])    Trichomonas    Twitching     Family History  Problem Relation Age of Onset   Diabetes Mother    COPD Mother    Arthritis Mother    Hypertension Mother    Heart disease Mother    Congestive Heart Failure Mother    Hearing loss Father  Asthma Son    Cancer Maternal Grandmother        lung   Heart disease Paternal Grandmother        CHF   Past Surgical History:  Procedure Laterality Date   APPENDECTOMY     CHOLECYSTECTOMY     HAND SURGERY     OVARIAN CYST REMOVAL N/A 08/17/2007   SHOULDER ARTHROSCOPY  06/24/2022   TONSILLECTOMY     Social History   Social History Narrative   Works at a [employer].   Has children.   Does not smoke.   Eats meat fruits and vegetables.   Wears her seatbelt.       Lives at home with her son.   Right-handed.   One cup caffeine, three times per week.   Immunization History  Administered Date(s) Administered   PFIZER(Purple Top)SARS-COV-2 Vaccination 07/02/2020, 07/23/2020     Objective: Vital Signs: BP (!) 155/93 (BP Location: Left Arm, Patient Position: Sitting, Cuff Size: Large)   Pulse 84   Resp 16   Ht '5\' 2"'$  (1.575 m)   Wt 240 lb 3.2 oz (109 kg)   BMI 43.93 kg/m    Physical Exam Vitals and nursing note reviewed.  Constitutional:      Appearance: She is well-developed.  HENT:     Head: Normocephalic and atraumatic.  Eyes:     Conjunctiva/sclera: Conjunctivae normal.  Cardiovascular:     Rate and Rhythm: Normal rate and regular rhythm.     Heart sounds: Normal heart sounds.  Pulmonary:     Effort: Pulmonary effort is normal.     Breath sounds: Normal breath sounds.  Abdominal:     General: Bowel sounds are normal.     Palpations: Abdomen is soft.  Musculoskeletal:     Cervical back: Normal range of motion.  Lymphadenopathy:     Cervical: No cervical adenopathy.  Skin:    General: Skin is warm and dry.     Capillary Refill: Capillary refill takes less than 2 seconds.  Neurological:     Mental Status: She is alert and oriented to person, place, and time.  Psychiatric:        Behavior: Behavior normal.      Musculoskeletal Exam: C-spine was in good range of motion.  Right shoulder abduction was limited to 20 degrees and painful.  Left shoulder joint was in full range of motion.  Elbow joints in good range of motion.  Wrist joints MCPs PIPs and DIPs with good range of motion with no synovitis.  Hip joints with good range of motion.  Knee joints in good range of motion without any warmth swelling or effusion.  There was no tenderness over ankles or MTPs.  CDAI Exam: CDAI Score: 0.7  Patient Global: 5 mm; Provider Global: 2 mm Swollen: 0 ; Tender: 0  Joint Exam 07/28/2022   No joint exam has been documented for this visit    There is currently no information documented on the homunculus. Go to the Rheumatology activity and complete the homunculus joint exam.  Investigation: No additional findings.  Imaging: No results found.  Recent Labs: Lab Results  Component Value Date   WBC 6.8 04/22/2022   HGB 13.7 04/22/2022   PLT 273 04/22/2022   NA 139 04/22/2022   K 4.3 04/22/2022   CL 102 04/22/2022   CO2 27 04/22/2022   GLUCOSE 84 04/22/2022   BUN 9 04/22/2022   CREATININE 0.79 04/22/2022   BILITOT 0.5 04/22/2022   ALKPHOS  75 06/17/2017   AST 22 04/22/2022   ALT 27 04/22/2022   PROT 6.8 04/22/2022   ALBUMIN 4.5 06/17/2017   CALCIUM 9.5 04/22/2022   GFRAA 123 01/14/2021    Speciality Comments: No specialty comments available.  Procedures:  No procedures performed Allergies: Contrast media [iodinated contrast media]   Assessment / Plan:     Visit Diagnoses: Rheumatoid arthritis with rheumatoid factor of multiple sites without organ or systems involvement ([place]) - +RF, +CCP: She has been doing well has regards to rheumatoid arthritis.  She is on methotrexate 7 tablets p.o. weekly.  She denies any side effects.  She was off methotrexate for third shoulder joint surgery.  She has some discomfort in her hands and her knee joints which is improving.  High risk medication use - Methotrexate 7 tablets by mouth once weekly and folic acid 2 mg by mouth daily. -Labs obtained on April 22, 2022 were within normal limits.  Plan: CBC with Differential/Platelet, COMPLETE METABOLIC PANEL WITH GFR today and then every 3 months to monitor for drug toxicity.  Formation regarding utilization was placed in the AVS.  She was advised to hold methotrexate if she develops an infection and resume after the infection resolves.  Primary osteoarthritis of both hands - X-rays of both hands from 01/14/2021 were consistent with rheumatoid arthritis and osteoarthritis overlap.  She had no synovitis on my examination today.  Although  she complains of some discomfort in her right hand.  Chronic right shoulder pain -she has not limited painful range of motion of her right shoulder joint.  She underwent rotator cuff tear repair on June 24, 2022.  She has not started physical therapy yet.  Chronic right knee pain-patient complains of discomfort in her knee joint.  No warmth swelling or effusion was noted.  Range of motion exercises were advised.  Primary osteoarthritis of both feet - X-rays of both feet from 01/14/2021 were consistent with osteoarthritis.   Chronic SI joint pain - Resolved.  Orders: Orders Placed This Encounter  Procedures   CBC with Differential/Platelet   COMPLETE METABOLIC PANEL WITH GFR   No orders of the defined types were placed in this encounter.    Follow-Up Instructions: Return in about 5 months (around 12/28/2022) for Rheumatoid arthritis.   Bo Merino, MD  Note - This record has been created using Editor, commissioning.  Chart creation errors have been sought, but may not always  have been located. Such creation errors do not reflect on  the standard of medical care.

## 2022-07-23 ENCOUNTER — Telehealth: Payer: Self-pay

## 2022-07-23 DIAGNOSIS — Z9889 Other specified postprocedural states: Secondary | ICD-10-CM

## 2022-07-23 NOTE — Telephone Encounter (Signed)
[name] with [facility] needs order to be changed from PT to OT.  CB# [id number].  Please advise.  Thank you.

## 2022-07-23 NOTE — Addendum Note (Signed)
Addended by: Robyne Peers on: 07/23/2022 12:58 PM   Modules accepted: Orders

## 2022-07-27 ENCOUNTER — Ambulatory Visit (HOSPITAL_COMMUNITY): Payer: No Typology Code available for payment source | Admitting: Physical Therapy

## 2022-07-28 ENCOUNTER — Ambulatory Visit: Payer: No Typology Code available for payment source | Attending: Rheumatology | Admitting: Rheumatology

## 2022-07-28 ENCOUNTER — Encounter: Payer: Self-pay | Admitting: Rheumatology

## 2022-07-28 VITALS — BP 155/93 | HR 84 | Resp 16 | Ht 62.0 in | Wt 240.2 lb

## 2022-07-28 DIAGNOSIS — M19042 Primary osteoarthritis, left hand: Secondary | ICD-10-CM

## 2022-07-28 DIAGNOSIS — M25511 Pain in right shoulder: Secondary | ICD-10-CM

## 2022-07-28 DIAGNOSIS — M0579 Rheumatoid arthritis with rheumatoid factor of multiple sites without organ or systems involvement: Secondary | ICD-10-CM

## 2022-07-28 DIAGNOSIS — M19072 Primary osteoarthritis, left ankle and foot: Secondary | ICD-10-CM

## 2022-07-28 DIAGNOSIS — M19041 Primary osteoarthritis, right hand: Secondary | ICD-10-CM | POA: Diagnosis not present

## 2022-07-28 DIAGNOSIS — G8929 Other chronic pain: Secondary | ICD-10-CM

## 2022-07-28 DIAGNOSIS — Z79899 Other long term (current) drug therapy: Secondary | ICD-10-CM | POA: Diagnosis not present

## 2022-07-28 DIAGNOSIS — M19071 Primary osteoarthritis, right ankle and foot: Secondary | ICD-10-CM

## 2022-07-28 DIAGNOSIS — M25561 Pain in right knee: Secondary | ICD-10-CM

## 2022-07-28 NOTE — Patient Instructions (Signed)
Standing Labs We placed an order today for your standing lab work.   Please have your standing labs drawn in December every 3 months  If possible, please have your labs drawn 2 weeks prior to your appointment so that the provider can discuss your results at your appointment.  Please note that you may see your imaging and lab results in [facility] before we have reviewed them. We may be awaiting multiple results to interpret others before contacting you. Please allow our office up to 72 hours to thoroughly review all of the results before contacting the office for clarification of your results.  We currently have open lab daily: Monday through Thursday from 1:30 PM-4:30 PM and Friday from 1:30 PM- 4:00 PM If possible, please come for your lab work on Monday, Thursday or Friday afternoons, as you may experience shorter wait times.   Effective September 22, 2022 the new lab hours will change to: Monday through Thursday from 1:30 PM-5:00 PM and Friday from 8:30 AM-12:00 PM If possible, please come for your lab work on Monday and Thursday afternoons, as you may experience shorter wait times.  Please be advised, all patients with office appointments requiring lab work will take precedent over walk-in lab work.    The office is located at [address], [place], [place], [place] [address] No appointment is necessary.   Labs are drawn by [facility]. Please bring your co-pay at the time of your lab draw.  You may receive a bill from [facility] for your lab work.  Please note if you are on Hydroxychloroquine and and an order has been placed for a Hydroxychloroquine level, you will need to have it drawn 4 hours or more after your last dose.  If you wish to have your labs drawn at another location, please call the office 24 hours in advance to send orders.  If you have any questions regarding directions or hours of operation,  please call [phone].   As a reminder, please drink plenty of water prior to  coming for your lab work. Thanks!   Vaccines You are taking a medication(s) that can suppress your immune system.  The following immunizations are recommended: Flu annually Covid-19  Td/Tdap (tetanus, diphtheria, pertussis) every 10 years Pneumonia (Prevnar 15 then Pneumovax 23 at least 1 year apart.  Alternatively, can take Prevnar 20 without needing additional dose) Shingrix: 2 doses from 4 weeks to 6 months apart  Please check with your PCP to make sure you are up to date.   If you have signs or symptoms of an infection or start antibiotics: First, call your PCP for workup of your infection. Hold your medication through the infection, until you complete your antibiotics, and until symptoms resolve if you take the following: Injectable medication (Actemra, Benlysta, Cimzia, Cosentyx, Enbrel, Humira, Kevzara, Orencia, Remicade, Simponi, Stelara, Taltz, Tremfya) Methotrexate Leflunomide (Arava) Mycophenolate (Cellcept) Morrie Sheldon, Olumiant, or Rinvoq

## 2022-07-29 LAB — CBC WITH DIFFERENTIAL/PLATELET
Absolute Monocytes: 708 cells/uL (ref 200–950)
Basophils Absolute: 74 cells/uL (ref 0–200)
Basophils Relative: 0.8 %
Eosinophils Absolute: 258 cells/uL (ref 15–500)
Eosinophils Relative: 2.8 %
HCT: 40.8 % (ref 35.0–45.0)
Hemoglobin: 13.4 g/dL (ref 11.7–15.5)
Lymphs Abs: 1803 cells/uL (ref 850–3900)
MCH: 31.4 pg (ref 27.0–33.0)
MCHC: 32.8 g/dL (ref 32.0–36.0)
MCV: 95.6 fL (ref 80.0–100.0)
MPV: 10 fL (ref 7.5–12.5)
Monocytes Relative: 7.7 %
Neutro Abs: 6357 cells/uL (ref 1500–7800)
Neutrophils Relative %: 69.1 %
Platelets: 254 10*3/uL (ref 140–400)
RBC: 4.27 10*6/uL (ref 3.80–5.10)
RDW: 12.8 % (ref 11.0–15.0)
Total Lymphocyte: 19.6 %
WBC: 9.2 10*3/uL (ref 3.8–10.8)

## 2022-07-29 LAB — COMPLETE METABOLIC PANEL WITH GFR
AG Ratio: 1.5 (calc) (ref 1.0–2.5)
ALT: 22 U/L (ref 6–29)
AST: 18 U/L (ref 10–35)
Albumin: 4.1 g/dL (ref 3.6–5.1)
Alkaline phosphatase (APISO): 92 U/L (ref 37–153)
BUN: 8 mg/dL (ref 7–25)
CO2: 26 mmol/L (ref 20–32)
Calcium: 9.3 mg/dL (ref 8.6–10.4)
Chloride: 103 mmol/L (ref 98–110)
Creat: 0.67 mg/dL (ref 0.50–1.03)
Globulin: 2.7 g/dL (calc) (ref 1.9–3.7)
Glucose, Bld: 74 mg/dL (ref 65–99)
Potassium: 3.8 mmol/L (ref 3.5–5.3)
Sodium: 137 mmol/L (ref 135–146)
Total Bilirubin: 0.4 mg/dL (ref 0.2–1.2)
Total Protein: 6.8 g/dL (ref 6.1–8.1)
eGFR: 106 mL/min/{1.73_m2} (ref >=60)

## 2022-07-29 NOTE — Progress Notes (Signed)
CBC and CMP are normal.

## 2022-08-04 ENCOUNTER — Ambulatory Visit (INDEPENDENT_AMBULATORY_CARE_PROVIDER_SITE_OTHER): Payer: No Typology Code available for payment source | Admitting: Orthopaedic Surgery

## 2022-08-04 DIAGNOSIS — M19011 Primary osteoarthritis, right shoulder: Secondary | ICD-10-CM

## 2022-08-04 DIAGNOSIS — M75111 Incomplete rotator cuff tear or rupture of right shoulder, not specified as traumatic: Secondary | ICD-10-CM

## 2022-08-04 DIAGNOSIS — Z9889 Other specified postprocedural states: Secondary | ICD-10-CM

## 2022-08-04 NOTE — Progress Notes (Signed)
Post-Op Visit Note   Patient: [name]           Date of Birth: 19-May-1971           MRN: [id number] Visit Date: 08/04/2022 PCP: Cory Munch, PA-C   Assessment & Plan:  Chief Complaint:  Chief Complaint  Patient presents with   Right Shoulder - Routine Post Op    06/23/22 right shoulder scope   Visit Diagnoses:  1. S/P arthroscopy of right shoulder   2. Partial nontraumatic tear of right rotator cuff   3. Arthritis of right acromioclavicular joint     Plan: Patient is approximately 6 weeks status post right shoulder scope and debridement on 06/23/2022.  Therapy is scheduled to start next week.  Doing well overall has no real complaints.  Examination of right shoulder shows fully healed surgical scar.  Her range of motion has recovered very well.  Slightly limited to internal rotation.  Strength is improving appropriately.  For my standpoint she has recovered very well from the surgery.  She does need a little bit more time to increase her strength before she can return back to work as light duty is not available therefore we will keep her out of work for another 2 weeks so that she can do physical therapy.  After 2 weeks she can return back to work.  Work note provided today.  Follow-up as needed.  Follow-Up Instructions: No follow-ups on file.   Orders:  No orders of the defined types were placed in this encounter.  No orders of the defined types were placed in this encounter.   Imaging: No results found.  PMFS History: Patient Active Problem List   Diagnosis Date Noted   S/P arthroscopy of right shoulder 08/04/2022   Partial nontraumatic tear of right rotator cuff 05/26/2022   Arthritis of right acromioclavicular joint 05/26/2022   Chronic migraine w/o aura w/o status migrainosus, not intractable 11/13/2020   Fasciculation 11/13/2020   Multiple thyroid nodules 12/13/2018   Hair loss 12/13/2018   Encounter for IUD insertion 01/26/2018   Chronic SI  joint pain 11/23/2017   Multiple acquired skin tags 07/14/2017   Rheumatoid arthritis with rheumatoid factor of multiple sites without organ or systems involvement ([place]) 11/09/2016   High risk medication use 11/09/2016   Non compliance w medication regimen 11/09/2016   Constipation 06/16/2016   Obesity 05/16/2015   Past Medical History:  Diagnosis Date   Arthritis    BV (bacterial vaginosis)    Constipation 06/16/2016   HSV (herpes simplex virus) infection    IUD (intrauterine device) in place 03/20/2014   IUD inserted 01/23/13   Obesity    Osteoarthritis    RA (rheumatoid arthritis) ([place])    Trichomonas    Twitching     Family History  Problem Relation Age of Onset   Diabetes Mother    COPD Mother    Arthritis Mother    Hypertension Mother    Heart disease Mother    Congestive Heart Failure Mother    Hearing loss Father    Asthma Son    Cancer Maternal Grandmother        lung   Heart disease Paternal Grandmother        CHF    Past Surgical History:  Procedure Laterality Date   APPENDECTOMY     CHOLECYSTECTOMY     HAND SURGERY     OVARIAN CYST REMOVAL N/A 08/17/2007   SHOULDER ARTHROSCOPY  06/24/2022  TONSILLECTOMY     Social History   Occupational History   Occupation: Wellsite geologist  Tobacco Use   Smoking status: Never    Passive exposure: Current   Smokeless tobacco: Never  Vaping Use   Vaping Use: Never used  Substance and Sexual Activity   Alcohol use: Yes    Comment: 1 or 2 times per year   Drug use: Never   Sexual activity: Not on file

## 2022-08-10 ENCOUNTER — Ambulatory Visit (HOSPITAL_COMMUNITY): Payer: No Typology Code available for payment source | Attending: Internal Medicine | Admitting: Occupational Therapy

## 2022-08-10 DIAGNOSIS — M25611 Stiffness of right shoulder, not elsewhere classified: Secondary | ICD-10-CM | POA: Diagnosis present

## 2022-08-10 DIAGNOSIS — R29898 Other symptoms and signs involving the musculoskeletal system: Secondary | ICD-10-CM | POA: Insufficient documentation

## 2022-08-10 DIAGNOSIS — M25511 Pain in right shoulder: Secondary | ICD-10-CM | POA: Diagnosis present

## 2022-08-10 NOTE — Patient Instructions (Signed)

## 2022-08-10 NOTE — Therapy (Addendum)
OUTPATIENT OCCUPATIONAL THERAPY ORTHO EVALUATION  Patient Name: [name] MRN: [id number] DOB:[birth year], 51 y.o., female Today's Date: 08/13/2022  PCP: Cory Munch, PA-C REFERRING PROVIDER: Ginger Organ   OT Visits / Eval  Visit Number 1  Number of Visits 9  Date for OT Re-Evaluation 09/07/22  Authorization  Authorization Type [place] Perferred, [employer], No Co-pay  Authorization Time Period No Visit limit  OT Time Calculation  OT Start Time 1300  OT Stop Time 1343  OT Time Calculation (min) 43 min  End of Session  Activity Tolerance Patient tolerated treatment well  Behavior During Therapy [facility] for tasks assessed/performed     Past Medical History:  Diagnosis Date   Arthritis    BV (bacterial vaginosis)    Constipation 06/16/2016   HSV (herpes simplex virus) infection    IUD (intrauterine device) in place 03/20/2014   IUD inserted 01/23/13   Obesity    Osteoarthritis    RA (rheumatoid arthritis) ([place])    Trichomonas    Twitching    Past Surgical History:  Procedure Laterality Date   APPENDECTOMY     CHOLECYSTECTOMY     HAND SURGERY     OVARIAN CYST REMOVAL N/A 08/17/2007   SHOULDER ARTHROSCOPY  06/24/2022   TONSILLECTOMY     Patient Active Problem List   Diagnosis Date Noted   S/P arthroscopy of right shoulder 08/04/2022   Partial nontraumatic tear of right rotator cuff 05/26/2022   Arthritis of right acromioclavicular joint 05/26/2022   Chronic migraine w/o aura w/o status migrainosus, not intractable 11/13/2020   Fasciculation 11/13/2020   Multiple thyroid nodules 12/13/2018   Hair loss 12/13/2018   Encounter for IUD insertion 01/26/2018   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   Multiple acquired skin tags 07/14/2017   Rheumatoid arthritis with rheumatoid factor of multiple sites without organ or systems involvement ([place]) 11/09/2016   High risk medication use 11/09/2016   Non compliance w medication regimen 11/09/2016   Constipation  06/16/2016   Obesity 05/16/2015    ONSET DATE: 05/26/22  REFERRING DIAG: R shoulder Decompression  THERAPY DIAG:  Acute pain of right shoulder - Plan: Ot plan of care cert/re-cert  Stiffness of right shoulder, not elsewhere classified - Plan: Ot plan of care cert/re-cert  Other symptoms and signs involving the musculoskeletal system - Plan: Ot plan of care cert/re-cert  Rationale for Evaluation and Treatment Rehabilitation  SUBJECTIVE:   SUBJECTIVE STATEMENT: "I'm shocked that I haven't been having as much pain as I thought I would be." Pt accompanied by: self  PERTINENT HISTORY: Pt reports that she has had shoulder pain and stiffness for years, however recently it has gotten worse. She stated that her R shoulder was crunchy and painful, initially thought it was arthritis. Received 4 cortisone shots, with no relief. Pt was then referred to Dr. Erlinda Hong, who found, via MRI, pt had a partial rotator cuff tear. On pt had 06/24/22 shoulder decompression.  PRECAUTIONS: Shoulder  WEIGHT BEARING RESTRICTIONS No  PAIN:  Are you having pain? No  FALLS: Has patient fallen in last 6 months? No  LIVING ENVIRONMENT: Lives with: lives with their family and lives with their son Lives in: House/apartment  PLOF: Independent  PATIENT GOALS To improve movement and reduce pain with movement  OBJECTIVE:   HAND DOMINANCE: Right  ADLs: Overall ADLs: Able to complete ADL's with increased time due to pain with movements. Reports increased difficutly with pericare/toileting hygiene and pulling up pants.  FUNCTIONAL OUTCOME MEASURES: FOTO: 57.36/100  UPPER EXTREMITY ROM     Active ROM Right eval  Shoulder flexion 146  Shoulder abduction 146  Shoulder internal rotation 90  Shoulder external rotation 60  (Blank rows = not tested)   UPPER EXTREMITY MMT:     MMT Right eval  Shoulder flexion 4-/5  Shoulder abduction 3/5  Shoulder adduction 4-/5  Shoulder extension 4/5  Shoulder  internal rotation 5/5  Shoulder external rotation 5/5  Elbow flexion 5/5  Elbow extension 5/5  (Blank rows = not tested)  HAND FUNCTION: Grip strength: Right: 67 lbs; Left: 60 lbs  SENSATION: WFL  EDEMA: Mild swelling noted in the upper arm   OBSERVATIONS: Pt has severe fascial restrictions in the biceps, deltoid, and axillary region.    TODAY'S TREATMENT:  Evaluation Only   PATIENT EDUCATION: Education details: AA/ROM Person educated: Patient Education method: Consulting civil engineer, Media planner, and Handouts Education comprehension: verbalized understanding and returned demonstration   HOME EXERCISE PROGRAM: Eval: AA/ROM  GOALS: Goals reviewed with patient? Yes  SHORT TERM GOALS: Target date:  09/07/22     Pt will be provided with and educated on HEP to improve mobility in RUE required for ADL completion.  Goal status: INITIAL  2.  Pt will decrease pain in LUE to 2/10 or less in order to sleep for 4+ consecutive hours without waking due to pain.  Goal status: INITIAL  3.  Pt will decrease LUE fascial restrictions to minimal amounts or less to improve mobility required for overhead reaching tasks  Goal status: INITIAL  4.  Pt will increase A/ROM of LUE by 10 degrees in all directions to improve ability to reach overhead and behind back during dressing and bathing/pericare tasks.  Goal status: INITIAL  5.  Pt will increase strength in LUE to 5/5 to improve ability to perform heavy lifting tasks required by household and yard work tasks.  Goal status: INITIAL   ASSESSMENT:  CLINICAL IMPRESSION: Patient is a 51 y.o. F who was seen today for occupational therapy evaluation for s/p Shoulder Decompression. Pt reports that she has been having difficulty with dressing and bathing tasks, especially in relation to lower body tasks that require abduction and er/IR. Pt reports that she has overall been limited by increased weakness and pain, as well as self limiting shoulder  movements until she was able to be evaluated by therapy.    PERFORMANCE DEFICITS in functional skills including ADLs, IADLs, edema, ROM, strength, pain, fascial restrictions, body mechanics, endurance, and UE functional use.  IMPAIRMENTS are limiting patient from ADLs, IADLs, rest and sleep, work, leisure, and social participation.   COMORBIDITIES has no other co-morbidities that affects occupational performance. Patient will benefit from skilled OT to address above impairments and improve overall function.  MODIFICATION OR ASSISTANCE TO COMPLETE EVALUATION: No modification of tasks or assist necessary to complete an evaluation.  OT OCCUPATIONAL PROFILE AND HISTORY: Problem focused assessment: Including review of records relating to presenting problem.  CLINICAL DECISION MAKING: LOW - limited treatment options, no task modification necessary  REHAB POTENTIAL: Good  EVALUATION COMPLEXITY: Low      PLAN: OT FREQUENCY: 2x/week  OT DURATION: 4 weeks  PLANNED INTERVENTIONS: self care/ADL training, therapeutic exercise, therapeutic activity, manual therapy, scar mobilization, moist heat, cryotherapy, and patient/family education  RECOMMENDED OTHER SERVICES: N/A  CONSULTED AND AGREED WITH PLAN OF CARE: Patient  PLAN FOR NEXT SESSION: Review AA/ROM, start isometrics, proximal shoulder strengthening, wall walks   Harutyun Monteverde Yolanda Bonine, OTR/L [employer]  Patient [id number] 08/13/2022, 8:48 AM

## 2022-08-11 ENCOUNTER — Telehealth: Payer: Self-pay | Admitting: Orthopaedic Surgery

## 2022-08-11 ENCOUNTER — Encounter (HOSPITAL_COMMUNITY): Payer: Self-pay | Admitting: Occupational Therapy

## 2022-08-11 NOTE — Telephone Encounter (Signed)
Patient came in today to drop off return to work form that need to be filled out on Friday with a return to work date as 09/27 paper is on [employer] area please advise

## 2022-08-12 ENCOUNTER — Ambulatory Visit (HOSPITAL_COMMUNITY): Payer: No Typology Code available for payment source | Admitting: Occupational Therapy

## 2022-08-12 DIAGNOSIS — M25611 Stiffness of right shoulder, not elsewhere classified: Secondary | ICD-10-CM

## 2022-08-12 DIAGNOSIS — M25511 Pain in right shoulder: Secondary | ICD-10-CM | POA: Diagnosis not present

## 2022-08-12 DIAGNOSIS — R29898 Other symptoms and signs involving the musculoskeletal system: Secondary | ICD-10-CM

## 2022-08-12 NOTE — Patient Instructions (Signed)
  Complete the following 2 a day. Hold for 10 seconds. Complete 5-10 sets for each.   1) SHOULDER - ISOMETRIC FLEXION  Gently push your fist forward into a wall with your elbow bent.    2) SHOULDER - ISOMETRIC EXTENSION  Gently push your a bent elbow back into a wall.    3) SHOULDER - ISOMETRIC INTERNAL ROTATION   Gently press your hand into a wall using the palm side of your hand.  Maintain a bent elbow the entire time.        4) SHOULDER - ISOMETRIC ADDUCTION  Gently push your elbow into the side of your body.   5) SHOULDER - ISOMETRIC ABDUCTION  Gently push your elbow out to the side into a wall with your elbow bent.    Repeat all exercises 10-15 times, 1-2 times per day.  1) Shoulder Protraction    Begin with elbows by your side, slowly "punch" straight out in front of you.      2) Shoulder Flexion  Supine:     Standing:         Begin with arms at your side with thumbs pointed up, slowly raise both arms up and forward towards overhead.               3) Horizontal abduction/adduction  Supine:   Standing:           Begin with arms straight out in front of you, bring out to the side in at "T" shape. Keep arms straight entire time.                 4) Internal & External Rotation   Supine:     Standing:     Stand with elbows at the side and elbows bent 90 degrees. Move your forearms away from your body, then bring back inward toward the body.     5) Shoulder Abduction  Supine:     Standing:       Lying on your back begin with your arms flat on the table next to your side. Slowly move your arms out to the side so that they go overhead, in a jumping jack or snow angel movement.

## 2022-08-12 NOTE — Telephone Encounter (Signed)
Called and LMOM that letter is up front at check in.

## 2022-08-12 NOTE — Therapy (Signed)
OUTPATIENT OCCUPATIONAL THERAPY ORTHO EVALUATION  Patient Name: [name] MRN: [id number] DOB:[birth year], 51 y.o., female Today's Date: 08/13/2022  PCP: Cory Munch, PA-C REFERRING PROVIDER: Ginger Organ   OT End of Session - 08/12/22 1605     Visit Number 2    Number of Visits 9    Date for OT Re-Evaluation 09/07/22    Authorization Type [place] Perferred, [employer], No Co-pay    Authorization Time Period No Visit limit    OT Start Time 1526    OT Stop Time 1605    OT Time Calculation (min) 39 min    Activity Tolerance Patient tolerated treatment well    Behavior During Therapy WFL for tasks assessed/performed             Past Medical History:  Diagnosis Date   Arthritis    BV (bacterial vaginosis)    Constipation 06/16/2016   HSV (herpes simplex virus) infection    IUD (intrauterine device) in place 03/20/2014   IUD inserted 01/23/13   Obesity    Osteoarthritis    RA (rheumatoid arthritis) ([place])    Trichomonas    Twitching    Past Surgical History:  Procedure Laterality Date   APPENDECTOMY     CHOLECYSTECTOMY     HAND SURGERY     OVARIAN CYST REMOVAL N/A 08/17/2007   SHOULDER ARTHROSCOPY  06/24/2022   TONSILLECTOMY     Patient Active Problem List   Diagnosis Date Noted   S/P arthroscopy of right shoulder 08/04/2022   Partial nontraumatic tear of right rotator cuff 05/26/2022   Arthritis of right acromioclavicular joint 05/26/2022   Chronic migraine w/o aura w/o status migrainosus, not intractable 11/13/2020   Fasciculation 11/13/2020   Multiple thyroid nodules 12/13/2018   Hair loss 12/13/2018   Encounter for IUD insertion 01/26/2018   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   Multiple acquired skin tags 07/14/2017   Rheumatoid arthritis with rheumatoid factor of multiple sites without organ or systems involvement ([place]) 11/09/2016   High risk medication use 11/09/2016   Non compliance w medication regimen 11/09/2016   Constipation  06/16/2016   Obesity 05/16/2015    ONSET DATE: 05/26/22  REFERRING DIAG: R shoulder Decompression  THERAPY DIAG:  Acute pain of right shoulder  Stiffness of right shoulder, not elsewhere classified  Other symptoms and signs involving the musculoskeletal system  Rationale for Evaluation and Treatment Rehabilitation  SUBJECTIVE:   SUBJECTIVE STATEMENT: "Everything is going a lot smoother than I thought it would."  PRECAUTIONS: Shoulder  WEIGHT BEARING RESTRICTIONS No  PAIN:  Are you having pain? No  PATIENT GOALS To improve movement and reduce pain with movement  OBJECTIVE:   FUNCTIONAL OUTCOME MEASURES: FOTO: 57.36/100  UPPER EXTREMITY ROM     Active ROM Right eval  Shoulder flexion 146  Shoulder abduction 146  Shoulder internal rotation 90  Shoulder external rotation 60  (Blank rows = not tested)   UPPER EXTREMITY MMT:     MMT Right eval  Shoulder flexion 4-/5  Shoulder abduction 3/5  Shoulder adduction 4-/5  Shoulder extension 4/5  Shoulder internal rotation 5/5  Shoulder external rotation 5/5  Elbow flexion 5/5  Elbow extension 5/5  (Blank rows = not tested)  HAND FUNCTION: Grip strength: Right: 67 lbs; Left: 60 lbs  EDEMA: Mild swelling noted in the upper arm   OBSERVATIONS: Pt has severe fascial restrictions in the biceps, deltoid, and axillary region.    TODAY'S TREATMENT:  08/12/22 - Manual Therapy:  myofascial release and trigger point to address pain and fascial restrictions in order to improve ROM and function -AA/ROM: flexion, abduction, protraction, er/IR, 1x10, prolonged stretch at end ranges -Isometrics: flexion, extension, abduction, adduction, er/IR, 5x10" -A/ROM: flexion, abduction, er/IR, supine, 1x8 -wall washing: flexion, abduction, 1x10   PATIENT EDUCATION: Education details: A/ROM and isometrics Person educated: Patient Education method: Consulting civil engineer, Media planner, and Handouts Education comprehension: verbalized  understanding and returned demonstration   HOME EXERCISE PROGRAM: Eval: AA/ROM 08/12/22: A/ROM and Isometrics  GOALS: Goals reviewed with patient? Yes  SHORT TERM GOALS: Target date:  09/07/22     Pt will be provided with and educated on HEP to improve mobility in RUE required for ADL completion.  Goal status: IN PROGRESS  2.  Pt will decrease pain in LUE to 2/10 or less in order to sleep for 4+ consecutive hours without waking due to pain.  Goal status: IN PROGRESS  3.  Pt will decrease LUE fascial restrictions to minimal amounts or less to improve mobility required for overhead reaching tasks  Goal status: IN PROGRESS  4.  Pt will increase A/ROM of LUE by 10 degrees in all directions to improve ability to reach overhead and behind back during dressing and bathing/pericare tasks.  Goal status: IN PROGRESS  5.  Pt will increase strength in LUE to 5/5 to improve ability to perform heavy lifting tasks required by household and yard work tasks.  Goal status: IN PROGRESS   ASSESSMENT:  CLINICAL IMPRESSION: Pt presented to this session with improving ROM and minimal pain (prior to exercises). She tolerated manual therapy well and presented with mild to moderate fascial restrictions in her bicep, anterior axillary region, and trapezius. She was able to progress to A/ROM this session, which she tolerated well with good movement pattern. Therapist providing minimal cuing for proper mechanics to reduce compensatory strategies. Pt demonstrated fatigue with isometric exercises, requiring a rest break every other position. Anticipate she will continue to improve with ROM and will be able to beginning light strengthening in the coming sessions.   PLAN: OT FREQUENCY: 2x/week  OT DURATION: 4 weeks  PLANNED INTERVENTIONS: self care/ADL training, therapeutic exercise, therapeutic activity, manual therapy, scar mobilization, moist heat, cryotherapy, and patient/family education  RECOMMENDED  OTHER SERVICES: N/A  CONSULTED AND AGREED WITH PLAN OF CARE: Patient  PLAN FOR NEXT SESSION: Review AA/ROM, isometrics, proximal shoulder strengthening, wall walks, continue A/ROM   Paulita Fujita, OTR/L [place] Patient [phone] 08/13/2022, 8:55 AM

## 2022-08-13 ENCOUNTER — Encounter (HOSPITAL_COMMUNITY): Payer: Self-pay | Admitting: Occupational Therapy

## 2022-08-18 ENCOUNTER — Ambulatory Visit (HOSPITAL_COMMUNITY): Payer: No Typology Code available for payment source | Admitting: Occupational Therapy

## 2022-08-18 DIAGNOSIS — M25611 Stiffness of right shoulder, not elsewhere classified: Secondary | ICD-10-CM

## 2022-08-18 DIAGNOSIS — M25511 Pain in right shoulder: Secondary | ICD-10-CM | POA: Diagnosis not present

## 2022-08-18 DIAGNOSIS — R29898 Other symptoms and signs involving the musculoskeletal system: Secondary | ICD-10-CM

## 2022-08-18 NOTE — Therapy (Signed)
OUTPATIENT OCCUPATIONAL THERAPY ORTHO EVALUATION  Patient Name: [name] MRN: [id number] DOB:1970-12-11, 51 y.o., female Today's Date: 08/20/2022  PCP: Ginger Organ REFERRING PROVIDER: Cory Munch, PA-C     08/18/22 1600  OT Visits / Re-Eval  Visit Number 3  Number of Visits 9  Date for OT Re-Evaluation 09/07/22  Authorization  Authorization Type [place] Perferred, [employer], No Co-pay  Authorization Time Period No Visit limit  OT Time Calculation  OT Start Time 1523  OT Stop Time 5916  OT Time Calculation (min) 41 min  End of Session  Activity Tolerance Patient tolerated treatment well  Behavior During Therapy [facility] for tasks assessed/performed    Past Medical History:  Diagnosis Date   Arthritis    BV (bacterial vaginosis)    Constipation 06/16/2016   HSV (herpes simplex virus) infection    IUD (intrauterine device) in place 03/20/2014   IUD inserted 01/23/13   Obesity    Osteoarthritis    RA (rheumatoid arthritis) ([place])    Trichomonas    Twitching    Past Surgical History:  Procedure Laterality Date   APPENDECTOMY     CHOLECYSTECTOMY     HAND SURGERY     OVARIAN CYST REMOVAL N/A 08/17/2007   SHOULDER ARTHROSCOPY  06/24/2022   TONSILLECTOMY     Patient Active Problem List   Diagnosis Date Noted   S/P arthroscopy of right shoulder 08/04/2022   Partial nontraumatic tear of right rotator cuff 05/26/2022   Arthritis of right acromioclavicular joint 05/26/2022   Chronic migraine w/o aura w/o status migrainosus, not intractable 11/13/2020   Fasciculation 11/13/2020   Multiple thyroid nodules 12/13/2018   Hair loss 12/13/2018   Encounter for IUD insertion 01/26/2018   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   Multiple acquired skin tags 07/14/2017   Rheumatoid arthritis with rheumatoid factor of multiple sites without organ or systems involvement ([place]) 11/09/2016   High risk medication use 11/09/2016   Non compliance w medication regimen  11/09/2016   Constipation 06/16/2016   Obesity 05/16/2015    ONSET DATE: 05/26/22  REFERRING DIAG: R shoulder Decompression  THERAPY DIAG:  Acute pain of right shoulder  Stiffness of right shoulder, not elsewhere classified  Other symptoms and signs involving the musculoskeletal system  Rationale for Evaluation and Treatment Rehabilitation  SUBJECTIVE:   SUBJECTIVE STATEMENT: "Everything is going a lot smoother than I thought it would."  PRECAUTIONS: Shoulder  WEIGHT BEARING RESTRICTIONS No  PAIN:  Are you having pain? No  PATIENT GOALS To improve movement and reduce pain with movement  OBJECTIVE:   FUNCTIONAL OUTCOME MEASURES: FOTO: 57.36/100  UPPER EXTREMITY ROM     Active ROM Right eval  Shoulder flexion 146  Shoulder abduction 146  Shoulder internal rotation 90  Shoulder external rotation 60  (Blank rows = not tested)   UPPER EXTREMITY MMT:     MMT Right eval  Shoulder flexion 4-/5  Shoulder abduction 3/5  Shoulder adduction 4-/5  Shoulder extension 4/5  Shoulder internal rotation 5/5  Shoulder external rotation 5/5  Elbow flexion 5/5  Elbow extension 5/5  (Blank rows = not tested)  HAND FUNCTION: Grip strength: Right: 67 lbs; Left: 60 lbs  EDEMA: Mild swelling noted in the upper arm   OBSERVATIONS: Pt has severe fascial restrictions in the biceps, deltoid, and axillary region.    TODAY'S TREATMENT:  08/18/22 -Manual Therapy: myofascial release and trigger point to the trapezius, biceps, axillary region, scapular region, and pectoralis, in order to address  pain and fascial restrictions in order to improve ROM and function -A/ROM: flexion, abduction, Protraction er/IR, supine, 1x10 -Proximal shoulder strengthening: paddles, crisscross, circles each direction, 1x60" -Wall Washing: Flexion, Abduction, 1x5 with 5 sec holds -Scapula Strengthening: retraction, protraction, rows, extension, 1x10, red theraband  08/12/22 - Manual Therapy:  myofascial release and trigger point to address pain and fascial restrictions in order to improve ROM and function -AA/ROM: flexion, abduction, protraction, er/IR, 1x10, prolonged stretch at end ranges -Isometrics: flexion, extension, abduction, adduction, er/IR, 5x10" -A/ROM: flexion, abduction, er/IR, supine, 1x8 -wall washing: flexion, abduction, 1x10   PATIENT EDUCATION: Education details: Physiological scientist Person educated: Patient Education method: Explanation, Demonstration, and Handouts Education comprehension: verbalized understanding and returned demonstration   HOME EXERCISE PROGRAM: Eval: AA/ROM 08/12/22: A/ROM and Isometrics 08/18/22: Scapula Strengthening  GOALS: Goals reviewed with patient? Yes  SHORT TERM GOALS: Target date:  09/07/22     Pt will be provided with and educated on HEP to improve mobility in RUE required for ADL completion.  Goal status: IN PROGRESS  2.  Pt will decrease pain in LUE to 2/10 or less in order to sleep for 4+ consecutive hours without waking due to pain.  Goal status: IN PROGRESS  3.  Pt will decrease LUE fascial restrictions to minimal amounts or less to improve mobility required for overhead reaching tasks  Goal status: IN PROGRESS  4.  Pt will increase A/ROM of LUE by 10 degrees in all directions to improve ability to reach overhead and behind back during dressing and bathing/pericare tasks.  Goal status: IN PROGRESS  5.  Pt will increase strength in LUE to 5/5 to improve ability to perform heavy lifting tasks required by household and yard work tasks.  Goal status: IN PROGRESS   ASSESSMENT:  CLINICAL IMPRESSION: Pt presented to this session with improving ROM and minimal pain (prior to exercises). There were noted improvements in pt's fascial restrictions, with minimal restrictions in the axillary region and trapezius, and continued mod restrictions in the biceps. This session focused on A/ROM with proximal shoulder  strengthening to work on endurance and shoulder stability. Therapist provided verbal cuing for proper mechanics and positioning in order to limit compensatory techniques such as shoulder hiking and bending her elbow, which lessens the stretch in ROM. Pt will continue to benefit from skilled OT to maximize her independence with ADL's and IADL's.   PLAN: OT FREQUENCY: 2x/week  OT DURATION: 4 weeks  PLANNED INTERVENTIONS: self care/ADL training, therapeutic exercise, therapeutic activity, manual therapy, scar mobilization, moist heat, cryotherapy, and patient/family education  RECOMMENDED OTHER SERVICES: N/A  CONSULTED AND AGREED WITH PLAN OF CARE: Patient  PLAN FOR NEXT SESSION: Review AA/ROM, isometrics, proximal shoulder strengthening, wall walks, continue A/ROM, shoulder strengthening, muscle energy.    Toniann Ket Out Patient [phone] 08/20/2022, 12:05 AM

## 2022-08-18 NOTE — Patient Instructions (Signed)

## 2022-08-20 ENCOUNTER — Encounter (HOSPITAL_COMMUNITY): Payer: Self-pay | Admitting: Occupational Therapy

## 2022-08-20 ENCOUNTER — Ambulatory Visit (HOSPITAL_COMMUNITY): Payer: No Typology Code available for payment source | Admitting: Occupational Therapy

## 2022-08-20 DIAGNOSIS — M25611 Stiffness of right shoulder, not elsewhere classified: Secondary | ICD-10-CM

## 2022-08-20 DIAGNOSIS — M25511 Pain in right shoulder: Secondary | ICD-10-CM | POA: Diagnosis not present

## 2022-08-20 DIAGNOSIS — R29898 Other symptoms and signs involving the musculoskeletal system: Secondary | ICD-10-CM

## 2022-08-20 NOTE — Therapy (Signed)
OUTPATIENT OCCUPATIONAL THERAPY ORTHO EVALUATION  Patient Name: [name] MRN: [id number] DOB:[birth year], 51 y.o., female Today's Date: 08/20/2022  PCP: Cory Munch, PA-C REFERRING PROVIDER: Ginger Organ   OT End of Session - 08/20/22 0818     Visit Number 4    Number of Visits 9    Date for OT Re-Evaluation 09/07/22    Authorization Type [place] Perferred, [place], No Co-pay    Authorization Time Period No Visit limit    OT Start Time [phone]    OT Stop Time [phone]    OT Time Calculation (min) 41 min    Activity Tolerance Patient tolerated treatment well    Behavior During Therapy [facility] for tasks assessed/performed              Past Medical History:  Diagnosis Date   Arthritis    BV (bacterial vaginosis)    Constipation 06/16/2016   HSV (herpes simplex virus) infection    IUD (intrauterine device) in place 03/20/2014   IUD inserted 01/23/13   Obesity    Osteoarthritis    RA (rheumatoid arthritis) ([place])    Trichomonas    Twitching    Past Surgical History:  Procedure Laterality Date   APPENDECTOMY     CHOLECYSTECTOMY     HAND SURGERY     OVARIAN CYST REMOVAL N/A 08/17/2007   SHOULDER ARTHROSCOPY  06/24/2022   TONSILLECTOMY     Patient Active Problem List   Diagnosis Date Noted   S/P arthroscopy of right shoulder 08/04/2022   Partial nontraumatic tear of right rotator cuff 05/26/2022   Arthritis of right acromioclavicular joint 05/26/2022   Chronic migraine w/o aura w/o status migrainosus, not intractable 11/13/2020   Fasciculation 11/13/2020   Multiple thyroid nodules 12/13/2018   Hair loss 12/13/2018   Encounter for IUD insertion 01/26/2018   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   Multiple acquired skin tags 07/14/2017   Rheumatoid arthritis with rheumatoid factor of multiple sites without organ or systems involvement ([place]) 11/09/2016   High risk medication use 11/09/2016   Non compliance w medication regimen 11/09/2016   Constipation  06/16/2016   Obesity 05/16/2015    ONSET DATE: 05/26/22  REFERRING DIAG: R shoulder Decompression  THERAPY DIAG:  Acute pain of right shoulder  Other symptoms and signs involving the musculoskeletal system  Stiffness of right shoulder, not elsewhere classified  Rationale for Evaluation and Treatment Rehabilitation  SUBJECTIVE:   SUBJECTIVE STATEMENT: "I'm real sore today in my shoulder."  PRECAUTIONS: Shoulder  WEIGHT BEARING RESTRICTIONS No  PAIN:  Are you having pain? No  PATIENT GOALS To improve movement and reduce pain with movement  OBJECTIVE:   FUNCTIONAL OUTCOME MEASURES: FOTO: 57.36/100  UPPER EXTREMITY ROM     Active ROM Right eval  Shoulder flexion 146  Shoulder abduction 146  Shoulder internal rotation 90  Shoulder external rotation 60  (Blank rows = not tested)   UPPER EXTREMITY MMT:     MMT Right eval  Shoulder flexion 4-/5  Shoulder abduction 3/5  Shoulder adduction 4-/5  Shoulder extension 4/5  Shoulder internal rotation 5/5  Shoulder external rotation 5/5  Elbow flexion 5/5  Elbow extension 5/5  (Blank rows = not tested)  HAND FUNCTION: Grip strength: Right: 67 lbs; Left: 60 lbs  EDEMA: Mild swelling noted in the upper arm   OBSERVATIONS: Pt has severe fascial restrictions in the biceps, deltoid, and axillary region.    TODAY'S TREATMENT:  08/20/22 -Manual Therapy: myofascial release and trigger  point to the trapezius, biceps, axillary region, scapular region, and pectoralis, in order to address pain and fascial restrictions in order to improve ROM and function -AA/ROM: 3lb dowel, Flexion, abduction, horizontal abduction, protraction, er/IR, supine -Stretching: Latissimus stretch, bicep stretch, flexion up the wall, er/IR, 3x20" -A/ROM: flexion, abduction, horizontal abduction, er/IR, 1x10 -Ball on the wall: red weight ball, up/down and side to side, 2x60" -Scapula Strengthening: retraction, protraction, rows, extension, 1x10,  red theraband  08/18/22 -Manual Therapy: myofascial release and trigger point to the trapezius, biceps, axillary region, scapular region, and pectoralis, in order to address pain and fascial restrictions in order to improve ROM and function -A/ROM: flexion, abduction, Protraction er/IR, supine, 1x10 -Proximal shoulder strengthening: paddles, crisscross, circles each direction, 1x60" -Wall Washing: Flexion, Abduction, 1x5 with 5 sec holds -Scapula Strengthening: retraction, protraction, rows, extension, 1x10, red theraband  08/12/22 - Manual Therapy: myofascial release and trigger point to address pain and fascial restrictions in order to improve ROM and function -AA/ROM: flexion, abduction, protraction, er/IR, 1x10, prolonged stretch at end ranges -Isometrics: flexion, extension, abduction, adduction, er/IR, 5x10" -A/ROM: flexion, abduction, er/IR, supine, 1x8 -wall washing: flexion, abduction, 1x10   PATIENT EDUCATION: Education details: Physiological scientist Person educated: Patient Education method: Explanation, Demonstration, and Handouts Education comprehension: verbalized understanding and returned demonstration   HOME EXERCISE PROGRAM: Eval: AA/ROM 08/12/22: A/ROM and Isometrics 08/18/22: Scapula Strengthening 08/20/22: stretching - biceps, forward flexion, er/IR, Pectoralis stretch  GOALS: Goals reviewed with patient? Yes  SHORT TERM GOALS: Target date:  09/07/22     Pt will be provided with and educated on HEP to improve mobility in RUE required for ADL completion.  Goal status: IN PROGRESS  2.  Pt will decrease pain in LUE to 2/10 or less in order to sleep for 4+ consecutive hours without waking due to pain.  Goal status: IN PROGRESS  3.  Pt will decrease LUE fascial restrictions to minimal amounts or less to improve mobility required for overhead reaching tasks  Goal status: IN PROGRESS  4.  Pt will increase A/ROM of LUE by 10 degrees in all directions to improve  ability to reach overhead and behind back during dressing and bathing/pericare tasks.  Goal status: IN PROGRESS  5.  Pt will increase strength in LUE to 5/5 to improve ability to perform heavy lifting tasks required by household and yard work tasks.  Goal status: IN PROGRESS   ASSESSMENT:  CLINICAL IMPRESSION: Pt presenting to session with increased soreness, however minimal fascial restrictions noted in trapezius only. Pt reporting that she has returned to work, however has not had to lift anything heavy yet, therefore she has not had any pain. This session, therapist focused on stretching and ROM this session with some shoulder endurance activities. Therapist providing min-mod verbal cuing to limit compensatory techniques and encourage proper mechanics and positioning. Pt continues to benefit from skilled OT to maximize her independence with ADL's and IADL's, by improving ROM and strength, and reducing pain.   PLAN: OT FREQUENCY: 2x/week  OT DURATION: 4 weeks  PLANNED INTERVENTIONS: self care/ADL training, therapeutic exercise, therapeutic activity, manual therapy, scar mobilization, moist heat, cryotherapy, and patient/family education  RECOMMENDED OTHER SERVICES: N/A  CONSULTED AND AGREED WITH PLAN OF CARE: Patient  PLAN FOR NEXT SESSION: Review AA/ROM, isometrics, proximal shoulder strengthening, wall walks, continue A/ROM, shoulder strengthening, shoulder endurance.    Toniann Ket Out Patient [phone] 08/20/2022, 8:19 AM

## 2022-08-25 ENCOUNTER — Encounter (HOSPITAL_COMMUNITY): Payer: Self-pay | Admitting: Occupational Therapy

## 2022-08-25 ENCOUNTER — Ambulatory Visit (HOSPITAL_COMMUNITY): Payer: No Typology Code available for payment source | Attending: Physician Assistant | Admitting: Occupational Therapy

## 2022-08-25 DIAGNOSIS — M19011 Primary osteoarthritis, right shoulder: Secondary | ICD-10-CM | POA: Insufficient documentation

## 2022-08-25 DIAGNOSIS — M25511 Pain in right shoulder: Secondary | ICD-10-CM

## 2022-08-25 DIAGNOSIS — M75111 Incomplete rotator cuff tear or rupture of right shoulder, not specified as traumatic: Secondary | ICD-10-CM | POA: Insufficient documentation

## 2022-08-25 DIAGNOSIS — Z5189 Encounter for other specified aftercare: Secondary | ICD-10-CM | POA: Diagnosis not present

## 2022-08-25 DIAGNOSIS — M25611 Stiffness of right shoulder, not elsewhere classified: Secondary | ICD-10-CM

## 2022-08-25 DIAGNOSIS — Z9889 Other specified postprocedural states: Secondary | ICD-10-CM | POA: Insufficient documentation

## 2022-08-25 DIAGNOSIS — R29898 Other symptoms and signs involving the musculoskeletal system: Secondary | ICD-10-CM

## 2022-08-25 NOTE — Therapy (Signed)
OUTPATIENT OCCUPATIONAL THERAPY ORTHO EVALUATION  Patient Name: [name] MRN: [id number] DOB:Mar 28, 1971, 51 y.o., female Today's Date: 08/25/2022  PCP: Cory Munch, PA-C REFERRING PROVIDER: Ginger Organ   OT End of Session - 08/25/22 1931     Visit Number 5    Number of Visits 9    Date for OT Re-Evaluation 09/07/22    Authorization Type [place] Perferred, A[employer], No Co-pay    Authorization Time Period No Visit limit    OT Start Time 1515    OT Stop Time 1600    OT Time Calculation (min) 45 min    Activity Tolerance Patient tolerated treatment well    Behavior During Therapy [facility] for tasks assessed/performed             Past Medical History:  Diagnosis Date   Arthritis    BV (bacterial vaginosis)    Constipation 06/16/2016   HSV (herpes simplex virus) infection    IUD (intrauterine device) in place 03/20/2014   IUD inserted 01/23/13   Obesity    Osteoarthritis    RA (rheumatoid arthritis) ([place])    Trichomonas    Twitching    Past Surgical History:  Procedure Laterality Date   APPENDECTOMY     CHOLECYSTECTOMY     HAND SURGERY     OVARIAN CYST REMOVAL N/A 08/17/2007   SHOULDER ARTHROSCOPY  06/24/2022   TONSILLECTOMY     Patient Active Problem List   Diagnosis Date Noted   S/P arthroscopy of right shoulder 08/04/2022   Partial nontraumatic tear of right rotator cuff 05/26/2022   Arthritis of right acromioclavicular joint 05/26/2022   Chronic migraine w/o aura w/o status migrainosus, not intractable 11/13/2020   Fasciculation 11/13/2020   Multiple thyroid nodules 12/13/2018   Hair loss 12/13/2018   Encounter for IUD insertion 01/26/2018   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   Multiple acquired skin tags 07/14/2017   Rheumatoid arthritis with rheumatoid factor of multiple sites without organ or systems involvement ([place]) 11/09/2016   High risk medication use 11/09/2016   Non compliance w medication regimen 11/09/2016   Constipation  06/16/2016   Obesity 05/16/2015    ONSET DATE: 05/26/22  REFERRING DIAG: R shoulder Decompression  THERAPY DIAG:  Acute pain of right shoulder  Other symptoms and signs involving the musculoskeletal system  Stiffness of right shoulder, not elsewhere classified  Rationale for Evaluation and Treatment Rehabilitation  SUBJECTIVE:   SUBJECTIVE STATEMENT: "I've really been trying to work it and do all the exercises you've given me."  PRECAUTIONS: Shoulder  WEIGHT BEARING RESTRICTIONS No  PAIN:  Are you having pain? No  PATIENT GOALS To improve movement and reduce pain with movement  OBJECTIVE:   FUNCTIONAL OUTCOME MEASURES: FOTO: 57.36/100  UPPER EXTREMITY ROM     Active ROM Right eval  Shoulder flexion 146  Shoulder abduction 146  Shoulder internal rotation 90  Shoulder external rotation 60  (Blank rows = not tested)   UPPER EXTREMITY MMT:     MMT Right eval  Shoulder flexion 4-/5  Shoulder abduction 3/5  Shoulder adduction 4-/5  Shoulder extension 4/5  Shoulder internal rotation 5/5  Shoulder external rotation 5/5  Elbow flexion 5/5  Elbow extension 5/5  (Blank rows = not tested)  HAND FUNCTION: Grip strength: Right: 67 lbs; Left: 60 lbs  EDEMA: Mild swelling noted in the upper arm   OBSERVATIONS: Pt has severe fascial restrictions in the biceps, deltoid, and axillary region.    TODAY'S TREATMENT:  08/25/22 -Manual Therapy: myofascial release and trigger point to the trapezius, biceps, axillary region, scapular region, and pectoralis, in order to address pain and fascial restrictions in order to improve ROM and function -AA/ROM: 5lb dowel, Flexion, abduction, horizontal abduction, protraction, er/IR, supine, 1x15 -A/ROM: standing, flexion, abduction, horizontal abduction, protraction, er/IR, 1x10 -Proximal shoulder strengthening: wall washing, ball on the wall, 1x70" -Scapula Strengthening: retraction, protraction, rows, extension, 1x10, green  theraband -Strengthening: 5lb dumbbell, bicep curls, tricep extensions, er/IR, supination/pronation, 1x10  08/20/22 -Manual Therapy: myofascial release and trigger point to the trapezius, biceps, axillary region, scapular region, and pectoralis, in order to address pain and fascial restrictions in order to improve ROM and function -AA/ROM: 3lb dowel, Flexion, abduction, horizontal abduction, protraction, er/IR, supine -Stretching: Latissimus stretch, bicep stretch, flexion up the wall, er/IR, 3x20" -A/ROM: flexion, abduction, horizontal abduction, er/IR, 1x10 -Ball on the wall: red weight ball, up/down and side to side, 2x60" -Scapula Strengthening: retraction, protraction, rows, extension, 1x10, red theraband  08/18/22 -Manual Therapy: myofascial release and trigger point to the trapezius, biceps, axillary region, scapular region, and pectoralis, in order to address pain and fascial restrictions in order to improve ROM and function -A/ROM: flexion, abduction, Protraction er/IR, supine, 1x10 -Proximal shoulder strengthening: paddles, crisscross, circles each direction, 1x60" -Wall Washing: Flexion, Abduction, 1x5 with 5 sec holds -Scapula Strengthening: retraction, protraction, rows, extension, 1x10, red theraband  PATIENT EDUCATION: Education details: Physiological scientist Person educated: Patient Education method: Explanation, Demonstration, and Handouts Education comprehension: verbalized understanding and returned demonstration   HOME EXERCISE PROGRAM: Eval: AA/ROM 08/12/22: A/ROM and Isometrics 08/18/22: Scapula Strengthening 08/20/22: stretching - biceps, forward flexion, er/IR, Pectoralis stretch  GOALS: Goals reviewed with patient? Yes  SHORT TERM GOALS: Target date:  09/07/22     Pt will be provided with and educated on HEP to improve mobility in RUE required for ADL completion.  Goal status: IN PROGRESS  2.  Pt will decrease pain in LUE to 2/10 or less in order to sleep  for 4+ consecutive hours without waking due to pain.  Goal status: IN PROGRESS  3.  Pt will decrease LUE fascial restrictions to minimal amounts or less to improve mobility required for overhead reaching tasks  Goal status: IN PROGRESS  4.  Pt will increase A/ROM of LUE by 10 degrees in all directions to improve ability to reach overhead and behind back during dressing and bathing/pericare tasks.  Goal status: IN PROGRESS  5.  Pt will increase strength in LUE to 5/5 to improve ability to perform heavy lifting tasks required by household and yard work tasks.  Goal status: IN PROGRESS   ASSESSMENT:  CLINICAL IMPRESSION: Pt with no complaints this session. Session focused on strengthening and improving shoulder stability endurance. Pt fatigues quickly during proximal shoulder exercises, however is able to recover quickly with a short rest break. Therapist providing mod verbal cuing during all exercises for positioning and technique. Provided pt with education on ensuring to continue strengthening exercises at home. She continues to benefit from skilled OT to maximize her independence with ADL's and IADL's, by improving ROM and strength, and reducing pain.   PLAN: OT FREQUENCY: 2x/week  OT DURATION: 4 weeks  PLANNED INTERVENTIONS: self care/ADL training, therapeutic exercise, therapeutic activity, manual therapy, scar mobilization, moist heat, cryotherapy, and patient/family education  RECOMMENDED OTHER SERVICES: N/A  CONSULTED AND AGREED WITH PLAN OF CARE: Patient  PLAN FOR NEXT SESSION: Review AA/ROM, isometrics, proximal shoulder strengthening, wall walks, continue A/ROM, shoulder strengthening, shoulder endurance.  Toniann Ket Out Patient [phone] 08/25/2022, 7:32 PM

## 2022-08-31 ENCOUNTER — Ambulatory Visit (HOSPITAL_COMMUNITY): Payer: No Typology Code available for payment source | Attending: Internal Medicine | Admitting: Occupational Therapy

## 2022-08-31 DIAGNOSIS — M25511 Pain in right shoulder: Secondary | ICD-10-CM

## 2022-08-31 DIAGNOSIS — R29898 Other symptoms and signs involving the musculoskeletal system: Secondary | ICD-10-CM | POA: Diagnosis present

## 2022-08-31 DIAGNOSIS — M25611 Stiffness of right shoulder, not elsewhere classified: Secondary | ICD-10-CM | POA: Diagnosis present

## 2022-08-31 NOTE — Therapy (Signed)
OUTPATIENT OCCUPATIONAL THERAPY ORTHO EVALUATION  Patient Name: [name] MRN: [id number] DOB:14-Jul-1971, 51 y.o., female Today's Date: 09/02/2022  PCP: Ginger Organ REFERRING PROVIDER: Cory Munch, PA-C   End of Session:  08/31/22 1601  OT Visits / Re-Eval  Visit Number 6  Number of Visits 9  Date for OT Re-Evaluation 09/07/22  Authorization  Authorization Type [place] Perferred, [employer], No Co-pay  Authorization Time Period No Visit limit  OT Time Calculation  OT Start Time 1519  OT Stop Time 1601  OT Time Calculation (min) 42 min  End of Session  Activity Tolerance Patient tolerated treatment well  Behavior During Therapy [facility] for tasks assessed/performed    Past Medical History:  Diagnosis Date   Arthritis    BV (bacterial vaginosis)    Constipation 06/16/2016   HSV (herpes simplex virus) infection    IUD (intrauterine device) in place 03/20/2014   IUD inserted 01/23/13   Obesity    Osteoarthritis    RA (rheumatoid arthritis) ([place])    Trichomonas    Twitching    Past Surgical History:  Procedure Laterality Date   APPENDECTOMY     CHOLECYSTECTOMY     HAND SURGERY     OVARIAN CYST REMOVAL N/A 08/17/2007   SHOULDER ARTHROSCOPY  06/24/2022   TONSILLECTOMY     Patient Active Problem List   Diagnosis Date Noted   S/P arthroscopy of right shoulder 08/04/2022   Partial nontraumatic tear of right rotator cuff 05/26/2022   Arthritis of right acromioclavicular joint 05/26/2022   Chronic migraine w/o aura w/o status migrainosus, not intractable 11/13/2020   Fasciculation 11/13/2020   Multiple thyroid nodules 12/13/2018   Hair loss 12/13/2018   Encounter for IUD insertion 01/26/2018   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   Multiple acquired skin tags 07/14/2017   Rheumatoid arthritis with rheumatoid factor of multiple sites without organ or systems involvement ([place]) 11/09/2016   High risk medication use 11/09/2016   Non compliance w medication  regimen 11/09/2016   Constipation 06/16/2016   Obesity 05/16/2015    ONSET DATE: 05/26/22  REFERRING DIAG: R shoulder Decompression  THERAPY DIAG:  Acute pain of right shoulder  Other symptoms and signs involving the musculoskeletal system  Stiffness of right shoulder, not elsewhere classified  Rationale for Evaluation and Treatment Rehabilitation  SUBJECTIVE:   SUBJECTIVE STATEMENT: "I'm staying pretty sore."  PRECAUTIONS: Shoulder  WEIGHT BEARING RESTRICTIONS No  PAIN:  Are you having pain? No  PATIENT GOALS To improve movement and reduce pain with movement  OBJECTIVE:   FUNCTIONAL OUTCOME MEASURES: FOTO: 57.36/100  UPPER EXTREMITY ROM     Active ROM Right eval  Shoulder flexion 146  Shoulder abduction 146  Shoulder internal rotation 90  Shoulder external rotation 60  (Blank rows = not tested)   UPPER EXTREMITY MMT:     MMT Right eval  Shoulder flexion 4-/5  Shoulder abduction 3/5  Shoulder adduction 4-/5  Shoulder extension 4/5  Shoulder internal rotation 5/5  Shoulder external rotation 5/5  Elbow flexion 5/5  Elbow extension 5/5  (Blank rows = not tested)  HAND FUNCTION: Grip strength: Right: 67 lbs; Left: 60 lbs  EDEMA: Mild swelling noted in the upper arm   OBSERVATIONS: Pt has severe fascial restrictions in the biceps, deltoid, and axillary region.    TODAY'S TREATMENT:  08/31/22 -Manual Therapy: myofascial release and trigger point to the trapezius, biceps, axillary region, scapular region, and pectoralis, in order to address pain and fascial restrictions in  order to improve ROM and function -A/ROM: standing, flexion, abduction, horizontal abduction, protraction, er/IR, 1x10 -Shoulder Strengthening: 5lb dumbbell, flexion and er/IR, 3lb dumbbell, abduction, protraction, horizontal abduction, 1x10 -Strengthening: 5lb dumbbell, bicep curls, tricep extensions, supination/pronation, 1x10 -Static Holds: 4lb dumbbell, flexion at 90 degrees,  hold for 1 loop around clinic -Wall Slides: flexion and abduction, 1x10 with 5 second holds  08/25/22 -Manual Therapy: myofascial release and trigger point to the trapezius, biceps, axillary region, scapular region, and pectoralis, in order to address pain and fascial restrictions in order to improve ROM and function -AA/ROM: 5lb dowel, Flexion, abduction, horizontal abduction, protraction, er/IR, supine, 1x15 -A/ROM: standing, flexion, abduction, horizontal abduction, protraction, er/IR, 1x10 -Proximal shoulder strengthening: wall washing, ball on the wall, 1x70" -Scapula Strengthening: retraction, protraction, rows, extension, 1x10, green theraband -Strengthening: 5lb dumbbell, bicep curls, tricep extensions, er/IR, supination/pronation, 1x10  08/20/22 -Manual Therapy: myofascial release and trigger point to the trapezius, biceps, axillary region, scapular region, and pectoralis, in order to address pain and fascial restrictions in order to improve ROM and function -AA/ROM: 3lb dowel, Flexion, abduction, horizontal abduction, protraction, er/IR, supine -Stretching: Latissimus stretch, bicep stretch, flexion up the wall, er/IR, 3x20" -A/ROM: flexion, abduction, horizontal abduction, er/IR, 1x10 -Ball on the wall: red weight ball, up/down and side to side, 2x60" -Scapula Strengthening: retraction, protraction, rows, extension, 1x10, red theraband  PATIENT EDUCATION: Education details: Manufacturing engineer Person educated: Patient Education method: Explanation, Demonstration, and Handouts Education comprehension: verbalized understanding and returned demonstration   HOME EXERCISE PROGRAM: Eval: AA/ROM 08/12/22: A/ROM and Isometrics 08/18/22: Scapula Strengthening 08/20/22: stretching - biceps, forward flexion, er/IR, Pectoralis stretch 08/31/22: shoulder strengthening  GOALS: Goals reviewed with patient? Yes  SHORT TERM GOALS: Target date:  09/07/22     Pt will be provided with and  educated on HEP to improve mobility in RUE required for ADL completion.  Goal status: IN PROGRESS  2.  Pt will decrease pain in LUE to 2/10 or less in order to sleep for 4+ consecutive hours without waking due to pain.  Goal status: IN PROGRESS  3.  Pt will decrease LUE fascial restrictions to minimal amounts or less to improve mobility required for overhead reaching tasks  Goal status: IN PROGRESS  4.  Pt will increase A/ROM of LUE by 10 degrees in all directions to improve ability to reach overhead and behind back during dressing and bathing/pericare tasks.  Goal status: IN PROGRESS  5.  Pt will increase strength in LUE to 5/5 to improve ability to perform heavy lifting tasks required by household and yard work tasks.  Goal status: IN PROGRESS   ASSESSMENT:  CLINICAL IMPRESSION: Pt continuing to work on strengthening and shoulder endurance this session. She was able to incorporate static holds with 4lb weights for 1 min in order to simulate carrying tasks that she would do during cleaning or cooking. Therapist providing verbal cuing for proper technique and positioning, as well as education on the importance of continuing to strengthening surrounding musculatures such as the scapula muscles to reduce the impact on the rotator cuff muscles. She continues to benefit from skilled OT to maximize her independence with ADL's and IADL's, by improving ROM and strength, and reducing pain.   PLAN: OT FREQUENCY: 2x/week  OT DURATION: 4 weeks  PLANNED INTERVENTIONS: self care/ADL training, therapeutic exercise, therapeutic activity, manual therapy, scar mobilization, moist heat, cryotherapy, and patient/family education  RECOMMENDED OTHER SERVICES: N/A  CONSULTED AND AGREED WITH PLAN OF CARE: Patient  PLAN FOR NEXT SESSION: Review AA/ROM,  isometrics, proximal shoulder strengthening, wall walks, continue A/ROM, shoulder strengthening, shoulder endurance.    Toniann Ket Out  Patient [phone] 09/02/2022, 9:36 AM

## 2022-08-31 NOTE — Patient Instructions (Signed)

## 2022-09-02 ENCOUNTER — Ambulatory Visit (HOSPITAL_COMMUNITY): Payer: No Typology Code available for payment source | Admitting: Occupational Therapy

## 2022-09-02 ENCOUNTER — Encounter (HOSPITAL_COMMUNITY): Payer: Self-pay | Admitting: Occupational Therapy

## 2022-09-02 DIAGNOSIS — M25511 Pain in right shoulder: Secondary | ICD-10-CM | POA: Diagnosis not present

## 2022-09-02 DIAGNOSIS — R29898 Other symptoms and signs involving the musculoskeletal system: Secondary | ICD-10-CM

## 2022-09-02 DIAGNOSIS — M25611 Stiffness of right shoulder, not elsewhere classified: Secondary | ICD-10-CM

## 2022-09-02 NOTE — Therapy (Signed)
OUTPATIENT OCCUPATIONAL THERAPY ORTHO EVALUATION  Patient Name: [name] MRN: [id number] DOB:October 08, 1971, 51 y.o., female Today's Date: 09/03/2022  PCP: Cory Munch, PA-C REFERRING PROVIDER: Ginger Organ   OT End of Session - 09/03/22 1148     Visit Number 7    Number of Visits 9    Date for OT Re-Evaluation 09/07/22    Authorization Type [place] Perferred, [employer], No Co-pay    Authorization Time Period No Visit limit    OT Start Time 1515    OT Stop Time 1600    OT Time Calculation (min) 45 min    Activity Tolerance Patient tolerated treatment well    Behavior During Therapy [facility] for tasks assessed/performed            Past Medical History:  Diagnosis Date   Arthritis    BV (bacterial vaginosis)    Constipation 06/16/2016   HSV (herpes simplex virus) infection    IUD (intrauterine device) in place 03/20/2014   IUD inserted 01/23/13   Obesity    Osteoarthritis    RA (rheumatoid arthritis) ([place])    Trichomonas    Twitching    Past Surgical History:  Procedure Laterality Date   APPENDECTOMY     CHOLECYSTECTOMY     HAND SURGERY     OVARIAN CYST REMOVAL N/A 08/17/2007   SHOULDER ARTHROSCOPY  06/24/2022   TONSILLECTOMY     Patient Active Problem List   Diagnosis Date Noted   S/P arthroscopy of right shoulder 08/04/2022   Partial nontraumatic tear of right rotator cuff 05/26/2022   Arthritis of right acromioclavicular joint 05/26/2022   Chronic migraine w/o aura w/o status migrainosus, not intractable 11/13/2020   Fasciculation 11/13/2020   Multiple thyroid nodules 12/13/2018   Hair loss 12/13/2018   Encounter for IUD insertion 01/26/2018   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   Multiple acquired skin tags 07/14/2017   Rheumatoid arthritis with rheumatoid factor of multiple sites without organ or systems involvement ([place]) 11/09/2016   High risk medication use 11/09/2016   Non compliance w medication regimen 11/09/2016   Constipation  06/16/2016   Obesity 05/16/2015    ONSET DATE: 05/26/22  REFERRING DIAG: R shoulder Decompression  THERAPY DIAG:  Acute pain of right shoulder  Other symptoms and signs involving the musculoskeletal system  Stiffness of right shoulder, not elsewhere classified  Rationale for Evaluation and Treatment Rehabilitation  SUBJECTIVE:   SUBJECTIVE STATEMENT: "It hurts pretty bad today, I almost didn't want to come in."  PRECAUTIONS: Shoulder  WEIGHT BEARING RESTRICTIONS No  PAIN:  Are you having pain? No  PATIENT GOALS To improve movement and reduce pain with movement  OBJECTIVE:   FUNCTIONAL OUTCOME MEASURES: FOTO: 57.36/100  UPPER EXTREMITY ROM     Active ROM Right eval  Shoulder flexion 146  Shoulder abduction 146  Shoulder internal rotation 90  Shoulder external rotation 60  (Blank rows = not tested)   UPPER EXTREMITY MMT:     MMT Right eval  Shoulder flexion 4-/5  Shoulder abduction 3/5  Shoulder adduction 4-/5  Shoulder extension 4/5  Shoulder internal rotation 5/5  Shoulder external rotation 5/5  Elbow flexion 5/5  Elbow extension 5/5  (Blank rows = not tested)  HAND FUNCTION: Grip strength: Right: 67 lbs; Left: 60 lbs  EDEMA: Mild swelling noted in the upper arm   OBSERVATIONS: Pt has severe fascial restrictions in the biceps, deltoid, and axillary region.    TODAY'S TREATMENT:  09/02/22 -Manual Therapy: myofascial  release and trigger point to the trapezius, biceps, axillary region, scapular region, and pectoralis, in order to address pain and fascial restrictions in order to improve ROM and function -AA/ROM: Flexion, abduction, horizontal abduction, protraction, er/IR, supine, 1x15 -A/ROM: standing, flexion, abduction, horizontal abduction, er/IR, 1x10 -Shoulder stretches: flexion, abduction, doorway stretch, posterior capsule stretch, er at wall, 5x15"   -UBE: Level 2, 2' forward, 2' backward, pace: 4.0  08/31/22 -Manual Therapy:  myofascial release and trigger point to the trapezius, biceps, axillary region, scapular region, and pectoralis, in order to address pain and fascial restrictions in order to improve ROM and function -A/ROM: standing, flexion, abduction, horizontal abduction, protraction, er/IR, 1x10 -Shoulder Strengthening: 5lb dumbbell, flexion and er/IR, 3lb dumbbell, abduction, protraction, horizontal abduction, 1x10 -Strengthening: 5lb dumbbell, bicep curls, tricep extensions, supination/pronation, 1x10 -Static Holds: 4lb dumbbell, flexion at 90 degrees, hold for 1 loop around clinic -Wall Slides: flexion and abduction, 1x10 with 5 second holds  08/25/22 -Manual Therapy: myofascial release and trigger point to the trapezius, biceps, axillary region, scapular region, and pectoralis, in order to address pain and fascial restrictions in order to improve ROM and function -AA/ROM: 5lb dowel, Flexion, abduction, horizontal abduction, protraction, er/IR, supine, 1x15 -A/ROM: standing, flexion, abduction, horizontal abduction, protraction, er/IR, 1x10 -Proximal shoulder strengthening: wall washing, ball on the wall, 1x70" -Scapula Strengthening: retraction, protraction, rows, extension, 1x10, green theraband -Strengthening: 5lb dumbbell, bicep curls, tricep extensions, er/IR, supination/pronation, 1x10   PATIENT EDUCATION: Education details: Manufacturing engineer Person educated: Patient Education method: Explanation, Demonstration, and Handouts Education comprehension: verbalized understanding and returned demonstration   HOME EXERCISE PROGRAM: Eval: AA/ROM 08/12/22: A/ROM and Isometrics 08/18/22: Scapula Strengthening 08/20/22: stretching - biceps, forward flexion, er/IR, Pectoralis stretch 08/31/22: shoulder strengthening  GOALS: Goals reviewed with patient? Yes  SHORT TERM GOALS: Target date:  09/07/22     Pt will be provided with and educated on HEP to improve mobility in RUE required for ADL  completion.  Goal status: IN PROGRESS  2.  Pt will decrease pain in LUE to 2/10 or less in order to sleep for 4+ consecutive hours without waking due to pain.  Goal status: IN PROGRESS  3.  Pt will decrease LUE fascial restrictions to minimal amounts or less to improve mobility required for overhead reaching tasks  Goal status: IN PROGRESS  4.  Pt will increase A/ROM of LUE by 10 degrees in all directions to improve ability to reach overhead and behind back during dressing and bathing/pericare tasks.  Goal status: IN PROGRESS  5.  Pt will increase strength in LUE to 5/5 to improve ability to perform heavy lifting tasks required by household and yard work tasks.  Goal status: IN PROGRESS   ASSESSMENT:  CLINICAL IMPRESSION: Pt reporting increased pain and soreness this session. Therapist providing manual therapy to assist with reducing restriction and tension, in order to facilitate improved ROM and less pain. This session focused on stretching and ROM, due to pt's pain levels. Additionally, used the UBE for cool down to promote active rest as well as muscle endurance in LUE. Pt continues to benefit from skilled OT to maximize her independence with ADL's and IADL's, by improving ROM and strength, and reducing pain.   PLAN: OT FREQUENCY: 2x/week  OT DURATION: 4 weeks  PLANNED INTERVENTIONS: self care/ADL training, therapeutic exercise, therapeutic activity, manual therapy, scar mobilization, moist heat, cryotherapy, and patient/family education  RECOMMENDED OTHER SERVICES: N/A  CONSULTED AND AGREED WITH PLAN OF CARE: Patient  PLAN FOR NEXT SESSION: Review AA/ROM, isometrics,  proximal shoulder strengthening, wall walks, continue A/ROM, shoulder strengthening, shoulder endurance.    Toniann Ket Out Patient [phone] 09/03/2022, 11:50 AM

## 2022-09-03 ENCOUNTER — Encounter (HOSPITAL_COMMUNITY): Payer: Self-pay | Admitting: Occupational Therapy

## 2022-09-07 ENCOUNTER — Encounter (HOSPITAL_COMMUNITY): Payer: No Typology Code available for payment source | Admitting: Occupational Therapy

## 2022-09-09 ENCOUNTER — Encounter (HOSPITAL_COMMUNITY): Payer: Self-pay | Admitting: Occupational Therapy

## 2022-09-09 ENCOUNTER — Ambulatory Visit (HOSPITAL_COMMUNITY): Payer: No Typology Code available for payment source | Admitting: Occupational Therapy

## 2022-09-09 DIAGNOSIS — M25611 Stiffness of right shoulder, not elsewhere classified: Secondary | ICD-10-CM

## 2022-09-09 DIAGNOSIS — R29898 Other symptoms and signs involving the musculoskeletal system: Secondary | ICD-10-CM

## 2022-09-09 DIAGNOSIS — M25511 Pain in right shoulder: Secondary | ICD-10-CM

## 2022-09-09 NOTE — Therapy (Signed)
OUTPATIENT OCCUPATIONAL THERAPY ORTHO THERAPY  Patient Name: [name] MRN: [id number] DOB:11-23-1970, 51 y.o., female Today's Date: 09/09/2022  PCP: Cory Munch, PA-C REFERRING PROVIDER: Ginger Organ   OT End of Session - 09/09/22 1643     Visit Number 8    Number of Visits 9    Date for OT Re-Evaluation 09/24/22    Authorization Type [place] Perferred, [employer], No Co-pay    Authorization Time Period No Visit limit    OT Start Time 1516    OT Stop Time 1600    OT Time Calculation (min) 44 min    Activity Tolerance Patient tolerated treatment well    Behavior During Therapy [facility] for tasks assessed/performed            Past Medical History:  Diagnosis Date   Arthritis    BV (bacterial vaginosis)    Constipation 06/16/2016   HSV (herpes simplex virus) infection    IUD (intrauterine device) in place 03/20/2014   IUD inserted 01/23/13   Obesity    Osteoarthritis    RA (rheumatoid arthritis) ([place])    Trichomonas    Twitching    Past Surgical History:  Procedure Laterality Date   APPENDECTOMY     CHOLECYSTECTOMY     HAND SURGERY     OVARIAN CYST REMOVAL N/A 08/17/2007   SHOULDER ARTHROSCOPY  06/24/2022   TONSILLECTOMY     Patient Active Problem List   Diagnosis Date Noted   S/P arthroscopy of right shoulder 08/04/2022   Partial nontraumatic tear of right rotator cuff 05/26/2022   Arthritis of right acromioclavicular joint 05/26/2022   Chronic migraine w/o aura w/o status migrainosus, not intractable 11/13/2020   Fasciculation 11/13/2020   Multiple thyroid nodules 12/13/2018   Hair loss 12/13/2018   Encounter for IUD insertion 01/26/2018   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   Multiple acquired skin tags 07/14/2017   Rheumatoid arthritis with rheumatoid factor of multiple sites without organ or systems involvement ([place]) 11/09/2016   High risk medication use 11/09/2016   Non compliance w medication regimen 11/09/2016   Constipation 06/16/2016    Obesity 05/16/2015    ONSET DATE: 05/26/22  REFERRING DIAG: R shoulder Decompression  THERAPY DIAG:  Acute pain of right shoulder  Other symptoms and signs involving the musculoskeletal system  Stiffness of right shoulder, not elsewhere classified  Rationale for Evaluation and Treatment Rehabilitation  SUBJECTIVE:   SUBJECTIVE STATEMENT: "I've been taking it easy and feeling pretty good this week."  PRECAUTIONS: Shoulder  WEIGHT BEARING RESTRICTIONS No  PAIN:  Are you having pain? No  PATIENT GOALS To improve movement and reduce pain with movement  OBJECTIVE:   FUNCTIONAL OUTCOME MEASURES: FOTO: 57.36/100  UPPER EXTREMITY ROM     Active ROM Right eval  Shoulder flexion 146  Shoulder abduction 146  Shoulder internal rotation 90  Shoulder external rotation 60  (Blank rows = not tested)   UPPER EXTREMITY MMT:     MMT Right eval  Shoulder flexion 4-/5  Shoulder abduction 3/5  Shoulder adduction 4-/5  Shoulder extension 4/5  Shoulder internal rotation 5/5  Shoulder external rotation 5/5  Elbow flexion 5/5  Elbow extension 5/5  (Blank rows = not tested)  HAND FUNCTION: Grip strength: Right: 67 lbs; Left: 60 lbs  EDEMA: Mild swelling noted in the upper arm   OBSERVATIONS: Pt has severe fascial restrictions in the biceps, deltoid, and axillary region.    TODAY'S TREATMENT:  09/09/22 -Manual Therapy: myofascial release  and trigger point to the trapezius, biceps, axillary region, scapular region, and pectoralis, in order to address pain and fascial restrictions in order to improve ROM and function -Stretching: ER behind back with vertical towel, bicep on the wall, posterior capsule stretch, and doorway stretch, 5x15" -Scapula Strengthening: blue band, extension, retraction, protraction, rows, 1x15 -PNF Strengthening: Blue band, PNF pulling up, PNF pulling down, chest pull, ER pull, 1x15 -Weighted ball: 4lb ball, chest press to push up at 120 degrees  flexion, passing around the body, 1x15 -Shoulder Strengthening: flexion, abduction, adduction, er/IR, 1x10, blue theraband  09/02/22 -Manual Therapy: myofascial release and trigger point to the trapezius, biceps, axillary region, scapular region, and pectoralis, in order to address pain and fascial restrictions in order to improve ROM and function -AA/ROM: Flexion, abduction, horizontal abduction, protraction, er/IR, supine, 1x15 -A/ROM: standing, flexion, abduction, horizontal abduction, er/IR, 1x10 -Shoulder stretches: flexion, abduction, doorway stretch, posterior capsule stretch, er at wall, 5x15"   -UBE: Level 2, 2' forward, 2' backward, pace: 4.0  08/31/22 -Manual Therapy: myofascial release and trigger point to the trapezius, biceps, axillary region, scapular region, and pectoralis, in order to address pain and fascial restrictions in order to improve ROM and function -A/ROM: standing, flexion, abduction, horizontal abduction, protraction, er/IR, 1x10 -Shoulder Strengthening: 5lb dumbbell, flexion and er/IR, 3lb dumbbell, abduction, protraction, horizontal abduction, 1x10 -Strengthening: 5lb dumbbell, bicep curls, tricep extensions, supination/pronation, 1x10 -Static Holds: 4lb dumbbell, flexion at 90 degrees, hold for 1 loop around clinic -Wall Slides: flexion and abduction, 1x10 with 5 second holds  PATIENT EDUCATION: Education details: Manufacturing engineer Person educated: Patient Education method: Explanation, Demonstration, and Handouts Education comprehension: verbalized understanding and returned demonstration   HOME EXERCISE PROGRAM: Eval: AA/ROM 08/12/22: A/ROM and Isometrics 08/18/22: Scapula Strengthening 08/20/22: stretching - biceps, forward flexion, er/IR, Pectoralis stretch 08/31/22: shoulder strengthening 09/09/22: PNF Strengthening  GOALS: Goals reviewed with patient? Yes  SHORT TERM GOALS: Target date:  09/07/22     Pt will be provided with and educated  on HEP to improve mobility in RUE required for ADL completion.  Goal status: IN PROGRESS  2.  Pt will decrease pain in LUE to 2/10 or less in order to sleep for 4+ consecutive hours without waking due to pain.  Goal status: IN PROGRESS  3.  Pt will decrease LUE fascial restrictions to minimal amounts or less to improve mobility required for overhead reaching tasks  Goal status: IN PROGRESS  4.  Pt will increase A/ROM of LUE by 10 degrees in all directions to improve ability to reach overhead and behind back during dressing and bathing/pericare tasks.  Goal status: IN PROGRESS  5.  Pt will increase strength in LUE to 5/5 to improve ability to perform heavy lifting tasks required by household and yard work tasks.  Goal status: IN PROGRESS   ASSESSMENT:  CLINICAL IMPRESSION: Pt presents to this session with no reports of pain or stiffness. Session focused on continuing to strengthen and stabilize her shoulder, while also stretching to reduce muscle tension. Therapist providing min cuing for positioning and techniques during all exercises. PNF exercises were added to pt's HEP for continued overall shoulder girdle strengthening with full ROM. Pt continues to benefit from skilled OT to maximize her independence with ADL's and IADL's, by improving ROM and strength, and reducing pain.   PLAN: OT FREQUENCY: 2x/week  OT DURATION: 4 weeks  PLANNED INTERVENTIONS: self care/ADL training, therapeutic exercise, therapeutic activity, manual therapy, scar mobilization, moist heat, cryotherapy, and patient/family education  RECOMMENDED OTHER  SERVICES: N/A  CONSULTED AND AGREED WITH PLAN OF CARE: Patient  PLAN FOR NEXT SESSION: Review AA/ROM, isometrics, proximal shoulder strengthening, wall walks, continue A/ROM, shoulder strengthening, shoulder endurance, Reassess for discharge   Toniann Ket Out Patient [phone] 09/09/2022, 4:46 PM

## 2022-09-09 NOTE — Patient Instructions (Signed)
1) Strengthening: Chest Pull - Resisted   Hold Theraband in front of body with hands about shoulder width a part. Pull band a part and back together slowly. Repeat ____ times. Complete ____ set(s) per session.. Repeat ____ session(s) per day.  http://orth.exer.us/926   Copyright  VHI. All rights reserved.   2) PNF Strengthening: Resisted   Standing with resistive band around each hand, bring right arm up and away, thumb back. Repeat ____ times per set. Do ____ sets per session. Do ____ sessions per day.                           3) Resisted External Rotation: in Neutral - Bilateral   Sit or stand, tubing in both hands, elbows at sides, bent to 90, forearms forward. Pinch shoulder blades together and rotate forearms out. Keep elbows at sides. Repeat ____ times per set. Do ____ sets per session. Do ____ sessions per day.  http://orth.exer.us/966   Copyright  VHI. All rights reserved.   4) PNF Strengthening: Resisted   Standing, hold resistive band above head. Bring right arm down and out from side. Repeat ____ times per set. Do ____ sets per session. Do ____ sessions per day.  http://orth.exer.us/922   Copyright  VHI. All rights reserved.   

## 2022-09-14 ENCOUNTER — Ambulatory Visit (HOSPITAL_COMMUNITY): Payer: No Typology Code available for payment source | Admitting: Occupational Therapy

## 2022-09-14 DIAGNOSIS — M25511 Pain in right shoulder: Secondary | ICD-10-CM

## 2022-09-14 DIAGNOSIS — R29898 Other symptoms and signs involving the musculoskeletal system: Secondary | ICD-10-CM

## 2022-09-14 DIAGNOSIS — M25611 Stiffness of right shoulder, not elsewhere classified: Secondary | ICD-10-CM

## 2022-09-14 NOTE — Therapy (Signed)
OUTPATIENT OCCUPATIONAL THERAPY ORTHO THERAPY  Patient Name: [name] MRN: [id number] DOB:Apr 02, 1971, 51 y.o., female Today's Date: 09/16/2022  PCP: Samuella Bruin REFERRING PROVIDER: Shawnie Dapper, PA-C  OCCUPATIONAL THERAPY DISCHARGE SUMMARY  Visits from Start of Care: 9  Current functional level related to goals / functional outcomes: [name] has met all of her goals. She has 4+/5 strength, ROM WFL, no pain, minimal fascial restrictions, and a comprehensive HEP to continue working towards improving strength.    Remaining deficits: No further deficits at this time, as pt has met all goals.   Education / Equipment: She was provided a comprehensive HEP, along with multiple resistance bands to continue progressing her strength.   Plan: Patient agrees to discharge as she has met all her stated rehab goals and has no further skilled OT needs.         OT End of Session - 09/16/22 2205     Visit Number 9    Number of Visits 9    Date for OT Re-Evaluation 09/24/22    Authorization Type [employer], [employer], No Co-pay    Authorization Time Period No Visit limit    OT Start Time 1604    OT Stop Time 1650    OT Time Calculation (min) 46 min    Activity Tolerance Patient tolerated treatment well    Behavior During Therapy WFL for tasks assessed/performed             Past Medical History:  Diagnosis Date   Arthritis    BV (bacterial vaginosis)    Constipation 06/16/2016   HSV (herpes simplex virus) infection    IUD (intrauterine device) in place 03/20/2014   IUD inserted 01/23/13   Obesity    Osteoarthritis    RA (rheumatoid arthritis) ([facility])    Trichomonas    Twitching    Past Surgical History:  Procedure Laterality Date   APPENDECTOMY     CHOLECYSTECTOMY     HAND SURGERY     OVARIAN CYST REMOVAL N/A 08/17/2007   SHOULDER ARTHROSCOPY  06/24/2022   TONSILLECTOMY     Patient Active Problem List   Diagnosis Date Noted   S/P arthroscopy  of right shoulder 08/04/2022   Partial nontraumatic tear of right rotator cuff 05/26/2022   Arthritis of right acromioclavicular joint 05/26/2022   Chronic migraine w/o aura w/o status migrainosus, not intractable 11/13/2020   Fasciculation 11/13/2020   Multiple thyroid nodules 12/13/2018   Hair loss 12/13/2018   Encounter for IUD insertion 01/26/2018   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   Multiple acquired skin tags 07/14/2017   Rheumatoid arthritis with rheumatoid factor of multiple sites without organ or systems involvement ([facility]) 11/09/2016   High risk medication use 11/09/2016   Non compliance w medication regimen 11/09/2016   Constipation 06/16/2016   Obesity 05/16/2015    ONSET DATE: 05/26/22  REFERRING DIAG: R shoulder Decompression  THERAPY DIAG:  Acute pain of right shoulder  Other symptoms and signs involving the musculoskeletal system  Stiffness of right shoulder, not elsewhere classified  Rationale for Evaluation and Treatment Rehabilitation  SUBJECTIVE:   SUBJECTIVE STATEMENT: "I was able to play baseball with my son on Saturday"  PRECAUTIONS: Shoulder  WEIGHT BEARING RESTRICTIONS No  PAIN:  Are you having pain? No  PATIENT GOALS To improve movement and reduce pain with movement  OBJECTIVE:   FUNCTIONAL OUTCOME MEASURES: FOTO: 57.36/100 09/14/22: 69.82/100  UPPER EXTREMITY ROM     Active ROM Right eval Right 10/24  Shoulder flexion 146 152  Shoulder abduction 146 156  Shoulder internal rotation 90 90  Shoulder external rotation 60 60  (Blank rows = not tested)   UPPER EXTREMITY MMT:     MMT Right eval Right 10/24  Shoulder flexion 4-/5 4+/5  Shoulder abduction 3/5 4-/5  Shoulder adduction 4-/5 5/5  Shoulder extension 4/5 4+/5  Shoulder internal rotation 5/5 5/5  Shoulder external rotation 5/5 5/5  Elbow flexion 5/5 5/5  Elbow extension 5/5 5/5  (Blank rows = not tested)  HAND FUNCTION: Grip strength: Right: 67 lbs; Left: 60  lbs 09/14/22: Right: 70lbs ; Left: 62lbs  EDEMA: Mild swelling noted in the upper arm 09/14/22: no swelling noted   OBSERVATIONS: Pt has severe fascial restrictions in the biceps, deltoid, and axillary region.  09/14/22: all fascial restrictions minimal to trace   TODAY'S TREATMENT:  09/14/22 -Manual Therapy: myofascial release and trigger point to the trapezius, biceps, axillary region, scapular region, and pectoralis, in order to address pain and fascial restrictions in order to improve ROM and function -Scapula Strengthening: blue band, extension, retraction, protraction, rows, 1x8 -PNF Strengthening: Blue band, PNF pulling up, PNF pulling down, chest pull, ER pull, 1x8 -Shoulder Strengthening: flexion, abduction, adduction, er/IR, 1x8, blue theraband -ROM for reassessment/discharge  09/09/22 -Manual Therapy: myofascial release and trigger point to the trapezius, biceps, axillary region, scapular region, and pectoralis, in order to address pain and fascial restrictions in order to improve ROM and function -Stretching: ER behind back with vertical towel, bicep on the wall, posterior capsule stretch, and doorway stretch, 5x15" -Scapula Strengthening: blue band, extension, retraction, protraction, rows, 1x15 -PNF Strengthening: Blue band, PNF pulling up, PNF pulling down, chest pull, ER pull, 1x15 -Weighted ball: 4lb ball, chest press to push up at 120 degrees flexion, passing around the body, 1x15 -Shoulder Strengthening: flexion, abduction, adduction, er/IR, 1x10, blue theraband  09/02/22 -Manual Therapy: myofascial release and trigger point to the trapezius, biceps, axillary region, scapular region, and pectoralis, in order to address pain and fascial restrictions in order to improve ROM and function -AA/ROM: Flexion, abduction, horizontal abduction, protraction, er/IR, supine, 1x15 -A/ROM: standing, flexion, abduction, horizontal abduction, er/IR, 1x10 -Shoulder stretches: flexion,  abduction, doorway stretch, posterior capsule stretch, er at wall, 5x15"   -UBE: Level 2, 2' forward, 2' backward, pace: 4.0  PATIENT EDUCATION: Education details: Reviewed HEP Person educated: Patient Education method: Explanation, Demonstration, and Handouts Education comprehension: verbalized understanding and returned demonstration   HOME EXERCISE PROGRAM: Eval: AA/ROM 08/12/22: A/ROM and Isometrics 08/18/22: Scapula Strengthening 08/20/22: stretching - biceps, forward flexion, er/IR, Pectoralis stretch 08/31/22: shoulder strengthening 09/09/22: PNF Strengthening  GOALS: Goals reviewed with patient? Yes  SHORT TERM GOALS: Target date:  09/07/22     Pt will be provided with and educated on HEP to improve mobility in RUE required for ADL completion.  Goal status: MET  2.  Pt will decrease pain in LUE to 2/10 or less in order to sleep for 4+ consecutive hours without waking due to pain.  Goal status: MET  3.  Pt will decrease LUE fascial restrictions to minimal amounts or less to improve mobility required for overhead reaching tasks  Goal status: MET  4.  Pt will increase A/ROM of LUE by 10 degrees in all directions to improve ability to reach overhead and behind back during dressing and bathing/pericare tasks.  Goal status: PARTIALLY MET  5.  Pt will increase strength in LUE to 5/5 to improve ability to perform heavy lifting tasks required by  household and yard work tasks.  Goal status: PARTIALLY MET   ASSESSMENT:  CLINICAL IMPRESSION: Pt was seen for her reassessment and discharge. She has met all of her goals. This session therapist and pt reviewed her comprehensive HEP. Pt was able to complete all strengthening exercises with good positioning and technique, with minimal cuing from OT. Shantice needs no further skilled OT services at this time and is discharged from OT services.   PLAN: OT FREQUENCY: Discharge    Yomira Flitton [place] Out  Patient [phone] 09/16/2022, 10:08 PM

## 2022-09-16 ENCOUNTER — Encounter (HOSPITAL_COMMUNITY): Payer: Self-pay | Admitting: Occupational Therapy

## 2022-09-16 ENCOUNTER — Encounter (HOSPITAL_COMMUNITY): Payer: No Typology Code available for payment source

## 2022-09-20 ENCOUNTER — Other Ambulatory Visit: Payer: Self-pay | Admitting: Physician Assistant

## 2022-09-20 NOTE — Telephone Encounter (Signed)
Next Visit: 12/28/2022  Last Visit: 07/28/2022  Last Fill: 06/08/2022  DX: Rheumatoid arthritis with rheumatoid factor of multiple sites without organ or systems involvement   Current Dose per office note 07/28/2022: Methotrexate 7 tablets by mouth once weekly   Labs: 07/28/2022 CBC and CMP are normal.  Okay to refill MTX?

## 2022-10-25 ENCOUNTER — Other Ambulatory Visit (HOSPITAL_COMMUNITY)
Admission: RE | Admit: 2022-10-25 | Discharge: 2022-10-25 | Disposition: A | Discharge status: Home / Self Care | Payer: No Typology Code available for payment source | Source: Ambulatory Visit | Attending: Adult Health | Admitting: Adult Health

## 2022-10-25 ENCOUNTER — Other Ambulatory Visit (HOSPITAL_COMMUNITY): Payer: Self-pay | Admitting: Adult Health

## 2022-10-25 ENCOUNTER — Encounter: Payer: Self-pay | Admitting: Adult Health

## 2022-10-25 ENCOUNTER — Ambulatory Visit (INDEPENDENT_AMBULATORY_CARE_PROVIDER_SITE_OTHER): Payer: No Typology Code available for payment source | Admitting: Adult Health

## 2022-10-25 VITALS — BP 176/99 | HR 85 | Ht 62.4 in | Wt 244.6 lb

## 2022-10-25 DIAGNOSIS — I10 Essential (primary) hypertension: Secondary | ICD-10-CM

## 2022-10-25 DIAGNOSIS — R102 Pelvic and perineal pain: Secondary | ICD-10-CM | POA: Insufficient documentation

## 2022-10-25 DIAGNOSIS — Z1231 Encounter for screening mammogram for malignant neoplasm of breast: Secondary | ICD-10-CM

## 2022-10-25 DIAGNOSIS — Z124 Encounter for screening for malignant neoplasm of cervix: Secondary | ICD-10-CM | POA: Diagnosis not present

## 2022-10-25 DIAGNOSIS — Z113 Encounter for screening for infections with a predominantly sexual mode of transmission: Secondary | ICD-10-CM | POA: Insufficient documentation

## 2022-10-25 DIAGNOSIS — R319 Hematuria, unspecified: Secondary | ICD-10-CM | POA: Insufficient documentation

## 2022-10-25 DIAGNOSIS — Z975 Presence of (intrauterine) contraceptive device: Secondary | ICD-10-CM | POA: Insufficient documentation

## 2022-10-25 LAB — POCT URINALYSIS DIPSTICK OB
Glucose, UA: NEGATIVE
Ketones, UA: NEGATIVE
Leukocytes, UA: NEGATIVE
Nitrite, UA: NEGATIVE

## 2022-10-25 MED ORDER — NITROFURANTOIN MONOHYD MACRO 100 MG PO CAPS: 100.0000 mg | ORAL_CAPSULE | Freq: Two times a day (BID) | ORAL | 0 refills | Status: DC

## 2022-10-25 NOTE — Progress Notes (Signed)
  Subjective:     Patient ID: [name], female   DOB: June 20, 1971, 51 y.o.   MRN: [id number]  HPI [name] is a  51 year old black  female, single, G2P1011, in complaining of pelvic pressure about 2 weeks, and blood red not brown like usual with IUD.  PCP is Larene Pickett Review of Systems Had pressure about 2 weeks ago  Blood more red now and usually brown with IUD    Reviewed past medical,surgical, social and family history. Reviewed medications and allergies.  Objective:   Physical Exam BP (!) 176/99 (BP Location: Right Arm, Patient Position: Sitting, Cuff Size: Large)   Pulse 85   Ht 5' 2.4" (1.585 m)   Wt 244 lb 9.6 oz (110.9 kg)   BMI 44.17 kg/m  urine dipstick shows moderate blood Skin warm and dry. Lungs: clear to ausculation bilaterally. Cardiovascular: regular rate and rhythm. Pelvic: external genitalia is normal in appearance no lesions, vagina: pink,urethra has no lesions or masses noted, cervix:smooth and bulbous, +IUD strings at os, pap with GC/CHL and HR HPV genotyping performed,uterus: normal size, shape and contour, non tender, no masses felt, adnexa: no masses or tenderness noted. Bladder is non tender and no masses felt.     Assessment:     1. Routine cervical smear Pap sent Pap in 3 years if normal  2. Screen for STD (sexually transmitted disease) Check GC/CHL on pap  3. Hematuria, unspecified type Not enough urine for UA C&S  Will rx Macrobid Meds ordered this encounter  Medications   nitrofurantoin, macrocrystal-monohydrate, (MACROBID) 100 MG capsule    Sig: Take 1 capsule (100 mg total) by mouth 2 (two) times daily.    Dispense:  14 capsule    Refill:  0    Order Specific Question:   Supervising Provider    Answer:   Elonda Husky, LUTHER H [[id number]]     4. Pelvic pressure in female It is better now   5. IUD (intrauterine device) in place Mirena placed 01/26/2018  6. Hypertension, unspecified type Watch salt and sugar and try to lose some weight    Plan:     I scheduled screening mammogram for her for 11/01/22 at 7:15 am at [facility]  Follow up in 8 weeks for BP check and ROS

## 2022-10-29 LAB — CYTOLOGY - PAP
Chlamydia: NEGATIVE
Comment: NEGATIVE
Comment: NEGATIVE
Comment: NORMAL
Diagnosis: NEGATIVE
High risk HPV: NEGATIVE
Neisseria Gonorrhea: NEGATIVE

## 2022-11-01 ENCOUNTER — Ambulatory Visit (HOSPITAL_COMMUNITY)
Admission: RE | Admit: 2022-11-01 | Discharge: 2022-11-01 | Disposition: A | Discharge status: Home / Self Care | Payer: No Typology Code available for payment source | Source: Ambulatory Visit | Attending: Adult Health | Admitting: Adult Health

## 2022-11-01 DIAGNOSIS — Z1231 Encounter for screening mammogram for malignant neoplasm of breast: Secondary | ICD-10-CM | POA: Insufficient documentation

## 2022-11-04 ENCOUNTER — Ambulatory Visit (INDEPENDENT_AMBULATORY_CARE_PROVIDER_SITE_OTHER): Payer: No Typology Code available for payment source | Admitting: Adult Health

## 2022-11-04 ENCOUNTER — Encounter: Payer: Self-pay | Admitting: Adult Health

## 2022-11-04 VITALS — BP 138/88 | HR 93 | Ht 62.0 in | Wt 242.0 lb

## 2022-11-04 DIAGNOSIS — N898 Other specified noninflammatory disorders of vagina: Secondary | ICD-10-CM

## 2022-11-04 DIAGNOSIS — B379 Candidiasis, unspecified: Secondary | ICD-10-CM | POA: Diagnosis not present

## 2022-11-04 LAB — POCT WET PREP (WET MOUNT)

## 2022-11-04 MED ORDER — FLUCONAZOLE 150 MG PO TABS: ORAL_TABLET | ORAL | 1 refills | Status: DC

## 2022-11-04 NOTE — Progress Notes (Signed)
  Subjective:     Patient ID: [name], female   DOB: 02/01/1971, 51 y.o.   MRN: [id number]  HPI [name] is a 51 year old black female,single, G2P1011 in complaining of clumpy vaginal discharge and vaginal itching, used OTC monistat, is on Macrobid. Last pap was negative HPV and malignancy 10/25/22. PCP is [place].  Review of Systems +vaginal discharge +vaginal itching Reviewed past medical,surgical, social and family history. Reviewed medications and allergies.     Objective:   Physical Exam BP 138/88 (BP Location: Left Arm, Patient Position: Sitting, Cuff Size: Normal)   Pulse 93   Ht '5\' 2"'$  (1.575 m)   Wt 242 lb (109.8 kg)   BMI 44.26 kg/m     Skin warm and dry.Pelvic: external genitalia is normal in appearance no lesions, vagina: white discharge without odor,urethra has no lesions or masses noted, cervix:smooth and bulbous, +IUD strings at os,uterus: normal size, shape and contour, non tender, no masses felt, adnexa: no masses or tenderness noted. Bladder is non tender and no masses felt. Wet prep: + for yeast Fall risk is low  Upstream - 11/04/22 1603       Pregnancy Intention Screening   Does the patient want to become pregnant in the next year? No    Does the patient's partner want to become pregnant in the next year? No    Would the patient like to discuss contraceptive options today? No      Contraception Wrap Up   Current Method IUD or IUS    End Method IUD or IUS    Contraception Counseling Provided No             Assessment:     1. Vaginal itching  2. Vaginal discharge  3. Yeast infection Will rx diflucan  Meds ordered this encounter  Medications   fluconazole (DIFLUCAN) 150 MG tablet    Sig: Take 1 now and 1 in 3 days    Dispense:  2 tablet    Refill:  1    Order Specific Question:   Supervising Provider    Answer:   Florian Buff [[id number]]       Plan:     Follow up prn

## 2022-12-01 ENCOUNTER — Other Ambulatory Visit: Payer: Self-pay | Admitting: Orthopaedic Surgery

## 2022-12-13 ENCOUNTER — Other Ambulatory Visit: Payer: Self-pay | Admitting: Physician Assistant

## 2022-12-13 NOTE — Telephone Encounter (Signed)
Next Visit: 12/28/2022   Last Visit: 07/28/2022   Last Fill: 05/31/29/2023   DX: Rheumatoid arthritis with rheumatoid factor of multiple sites without organ or systems involvement    Current Dose per office note 07/28/2022: Methotrexate 7 tablets by mouth once weekly    Labs: 07/28/2022 CBC and CMP are normal.  Left message to advise patient she is due to update labs.    Okay to refill MTX?

## 2022-12-14 NOTE — Progress Notes (Unsigned)
Office Visit Note  Patient: [name]             Date of Birth: 1970/12/30           MRN: [id number]             PCP: Cory Munch, PA-C Referring: Ginger Organ Visit Date: 12/28/2022 Occupation: '@GUAROCC'$ @  Subjective:  Medication monitoring   History of Present Illness: [name] is a 52 y.o. female with history of seropositive rheumatoid arthritis and osteoarthritis.  Patient remains on methotrexate 7 tablets by mouth once weekly along with folic acid 2 mg daily.  She denies any side effects while taking methotrexate.  Patient reports that about 3 weeks ago she was experiencing pain on the lateral aspect of her right hip.  She denies any injury prior to the onset of symptoms.  She states her symptoms last for about 3 days and had pain with walking at that time.  She denies any groin pain during that episode.  Patient reports that she notices intermittent discomfort in the left ankle with range of motion.  She has noticed some fluid retention and tightness in her hands and feet but denies any joint swelling.  She has not taking any over-the-counter products for pain relief.  Overall she continues to find methotrexate to be effective at managing her rheumatoid arthritis.   Activities of Daily Living:  Patient reports morning stiffness for 20 minutes  Patient Denies nocturnal pain.  Difficulty dressing/grooming: Denies Difficulty climbing stairs: Denies Difficulty getting out of chair: Denies Difficulty using hands for taps, buttons, cutlery, and/or writing: Denies  Review of Systems  Constitutional:  Negative for fatigue.  HENT:  Negative for mouth sores, mouth dryness and nose dryness.   Eyes:  Negative for pain, visual disturbance and dryness.  Respiratory:  Negative for cough, hemoptysis, shortness of breath and difficulty breathing.   Cardiovascular:  Negative for chest pain, palpitations, hypertension and swelling in legs/feet.  Gastrointestinal:   Positive for constipation. Negative for blood in stool and diarrhea.  Endocrine: Negative for increased urination.  Genitourinary:  Negative for painful urination.  Musculoskeletal:  Positive for joint pain, joint pain and morning stiffness. Negative for joint swelling, myalgias, muscle weakness, muscle tenderness and myalgias.  Skin:  Negative for color change, pallor, rash, hair loss, nodules/bumps, skin tightness, ulcers and sensitivity to sunlight.  Neurological:  Negative for dizziness, numbness, headaches and weakness.  Hematological:  Negative for swollen glands.  Psychiatric/Behavioral:  Positive for sleep disturbance. Negative for depressed mood. The patient is not nervous/anxious.     PMFS History:  Patient Active Problem List   Diagnosis Date Noted   Yeast infection 11/04/2022   Vaginal discharge 11/04/2022   Vaginal itching 11/04/2022   Hypertension 10/25/2022   IUD (intrauterine device) in place 10/25/2022   Pelvic pressure in female 10/25/2022   Hematuria 10/25/2022   Screen for STD (sexually transmitted disease) 10/25/2022   Routine cervical smear 10/25/2022   S/P arthroscopy of right shoulder 08/04/2022   Partial nontraumatic tear of right rotator cuff 05/26/2022   Arthritis of right acromioclavicular joint 05/26/2022   Chronic migraine w/o aura w/o status migrainosus, not intractable 11/13/2020   Fasciculation 11/13/2020   Multiple thyroid nodules 12/13/2018   Hair loss 12/13/2018   Encounter for IUD insertion 01/26/2018   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   Multiple acquired skin tags 07/14/2017   Rheumatoid arthritis with rheumatoid factor of multiple sites without organ or systems involvement (  [place]) 11/09/2016   High risk medication use 11/09/2016   Non compliance w medication regimen 11/09/2016   Constipation 06/16/2016   Obesity 05/16/2015    Past Medical History:  Diagnosis Date   Arthritis    BV (bacterial vaginosis)    Constipation 06/16/2016   HSV  (herpes simplex virus) infection    Hypertension    IUD (intrauterine device) in place 03/20/2014   IUD inserted 01/23/13   Obesity    Osteoarthritis    RA (rheumatoid arthritis) ([place])    Trichomonas    Twitching     Family History  Problem Relation Age of Onset   Diabetes Mother    COPD Mother    Arthritis Mother    Hypertension Mother    Heart disease Mother    Congestive Heart Failure Mother    Hearing loss Father    Asthma Son    Cancer Maternal Grandmother        lung   Heart disease Paternal Grandmother        CHF   Past Surgical History:  Procedure Laterality Date   APPENDECTOMY     CHOLECYSTECTOMY     HAND SURGERY     OVARIAN CYST REMOVAL N/A 08/17/2007   SHOULDER ARTHROSCOPY  06/24/2022   TONSILLECTOMY     Social History   Social History Narrative   Works at a [employer].   Has children.   Does not smoke.   Eats meat fruits and vegetables.   Wears her seatbelt.      Lives at home with her son.   Right-handed.   One cup caffeine, three times per week.   Immunization History  Administered Date(s) Administered   PFIZER(Purple Top)SARS-COV-2 Vaccination 07/02/2020, 07/23/2020     Objective: Vital Signs: BP (!) 144/84 (BP Location: Left Arm, Patient Position: Sitting, Cuff Size: Large)   Pulse 84    Physical Exam Vitals and nursing note reviewed.  Constitutional:      Appearance: She is well-developed.  HENT:     Head: Normocephalic and atraumatic.  Eyes:     Conjunctiva/sclera: Conjunctivae normal.  Cardiovascular:     Rate and Rhythm: Normal rate and regular rhythm.     Heart sounds: Normal heart sounds.  Pulmonary:     Effort: Pulmonary effort is normal.     Breath sounds: Normal breath sounds.  Abdominal:     General: Bowel sounds are normal.     Palpations: Abdomen is soft.  Musculoskeletal:     Cervical back: Normal range of motion.  Skin:    General: Skin is warm and dry.     Capillary Refill: Capillary refill takes less than 2  seconds.  Neurological:     Mental Status: She is alert and oriented to person, place, and time.  Psychiatric:        Behavior: Behavior normal.      Musculoskeletal Exam: C-spine, thoracic spine, lumbar spine have good range of motion.  Shoulder joints, elbow joints, wrist joints, MCPs, PIPs and DIPs have good motion with no synovitis.  Complete fist formation bilaterally.  Hip joints have good range of motion with no groin pain.  Knee joints have good range of motion with no warmth or effusion.  Ankle joints have good range of motion with no tenderness or joint swelling.  Mild tenderness over the right trochanteric bursa.   CDAI Exam: CDAI Score: -- Patient Global: 5 mm; Provider Global: 2 mm Swollen: --; Tender: -- Joint Exam 12/28/2022  No joint exam has been documented for this visit   There is currently no information documented on the homunculus. Go to the Rheumatology activity and complete the homunculus joint exam.  Investigation: No additional findings.  Imaging: No results found.  Recent Labs: Lab Results  Component Value Date   WBC 9.2 07/28/2022   HGB 13.4 07/28/2022   PLT 254 07/28/2022   NA 137 07/28/2022   K 3.8 07/28/2022   CL 103 07/28/2022   CO2 26 07/28/2022   GLUCOSE 74 07/28/2022   BUN 8 07/28/2022   CREATININE 0.67 07/28/2022   BILITOT 0.4 07/28/2022   ALKPHOS 75 06/17/2017   AST 18 07/28/2022   ALT 22 07/28/2022   PROT 6.8 07/28/2022   ALBUMIN 4.5 06/17/2017   CALCIUM 9.3 07/28/2022   GFRAA 123 01/14/2021    Speciality Comments: No specialty comments available.  Procedures:  No procedures performed Allergies: Contrast media [iodinated contrast media]      Assessment / Plan:     Visit Diagnoses: Rheumatoid arthritis with rheumatoid factor of multiple sites without organ or systems involvement ([place]) - +RF, +CCP: She has no synovitis on examination today.  She has not had any signs or symptoms of a rheumatoid arthritis flare.  She has  clinically been doing well taking methotrexate 7 tablets by mouth once weekly and folic acid 2 mg daily.  She continues to tolerate methotrexate without any side effects or recurrent infections.  She has not been experiencing any morning stiffness or nocturnal pain.  No difficulty with ADLs.  Patient will remain on methotrexate along with folic acid as prescribed.  Plan to update x-rays of hands and feet at her follow-up visit to assess for radiographic progression.  She will follow-up in the office in 5 months or sooner if needed.  High risk medication use - Methotrexate 7 tablets by mouth once weekly and folic acid 2 mg by mouth daily.  CBC and CMP within normal limits on 07/28/2022.  Orders for CBC and CMP were released today.  Her next lab work will be due in May and every 3 months to monitor for drug toxicity.  Standing orders for CBC and CMP will be placed today. No recent or recurrent infections.  Discussed the importance of holding methotrexate if she develops signs or symptoms of an infection and to resume once the infection has completely cleared. - Plan: CBC with Differential/Platelet, COMPLETE METABOLIC PANEL WITH GFR, CBC with Differential/Platelet, COMPLETE METABOLIC PANEL WITH GFR  Primary osteoarthritis of both hands - X-rays of both hands from 01/14/2021 were consistent with rheumatoid arthritis and osteoarthritis overlap.  No tenderness or synovitis noted.  Plan to update x-rays at her follow-up visit to assess for radiographic progression.  Chronic right shoulder pain - She underwent rotator cuff tear repair on June 24, 2022.  Good range of motion of the right shoulder joint with no discomfort at this time.  Chronic pain of right knee: Good range of motion of the right knee joint with no discomfort.  No warmth or effusion noted.  Primary osteoarthritis of both feet - X-rays of both feet from 01/14/2021 were consistent with osteoarthritis.  Good range of motion of both ankle joints with  some discomfort in the left ankle joint.  No tenderness or synovitis noted today.  Chronic SI joint pain - Resolved.  Trochanteric bursitis, right hip: Mild tenderness over the lateral aspect of the right hip consistent with trochanteric bursitis.  Discussed the importance of performing stretching exercises daily.  Orders: Orders Placed This Encounter  Procedures   CBC with Differential/Platelet   COMPLETE METABOLIC PANEL WITH GFR   CBC with Differential/Platelet   COMPLETE METABOLIC PANEL WITH GFR   No orders of the defined types were placed in this encounter.   Follow-Up Instructions: Return in about 5 months (around 05/28/2023) for Rheumatoid arthritis, Osteoarthritis.   Ofilia Neas, PA-C  Note - This record has been created using Dragon software.  Chart creation errors have been sought, but may not always  have been located. Such creation errors do not reflect on  the standard of medical care.

## 2022-12-21 ENCOUNTER — Encounter: Payer: Self-pay | Admitting: Adult Health

## 2022-12-21 ENCOUNTER — Ambulatory Visit: Payer: No Typology Code available for payment source | Admitting: Adult Health

## 2022-12-21 VITALS — BP 159/100 | HR 90 | Ht 62.0 in | Wt 237.5 lb

## 2022-12-21 DIAGNOSIS — I10 Essential (primary) hypertension: Secondary | ICD-10-CM

## 2022-12-21 MED ORDER — AMLODIPINE BESYLATE 5 MG PO TABS: 5.0000 mg | ORAL_TABLET | Freq: Every day | ORAL | 3 refills | Status: DC

## 2022-12-21 NOTE — Progress Notes (Signed)
  Subjective:     Patient ID: [name], female   DOB: [birth year], 52 y.o.   MRN: [id number]  HPI [name] is a 52 year old black female,single G2P1011, in for BP check. She is going to bariatric clinic and is on phentermine and has lost 7 lbs since I saw her 10/25/22.  Last pap was negative HPV,NILM 10/25/22. PCP is Ashley Has lost weight Has swelling in hands and feet at times, has RA Reviewed past medical,surgical, social and family history. Reviewed medications and allergies.     Objective:   Physical Exam BP (!) 159/100 (BP Location: Right Arm, Patient Position: Sitting, Cuff Size: Normal)   Pulse 90   Ht '5\' 2"'$  (1.575 m)   Wt 237 lb 8 oz (107.7 kg)   BMI 43.44 kg/m     Skin warm and dry.  Lungs: clear to ausculation bilaterally. Cardiovascular: regular rate and rhythm.  Fall risk is low  Upstream - 12/21/22 1346       Pregnancy Intention Screening   Does the patient want to become pregnant in the next year? No    Does the patient's partner want to become pregnant in the next year? No    Would the patient like to discuss contraceptive options today? No      Contraception Wrap Up   Current Method IUD or IUS    End Method IUD or IUS    Contraception Counseling Provided No             Assessment:     1. Hypertension, unspecified type Continue watching salt and sugars  Continue weight loss efforts, has appt in February Will start Norvasc 5 mg 1 daily Meds ordered this encounter  Medications   amLODipine (NORVASC) 5 MG tablet    Sig: Take 1 tablet (5 mg total) by mouth daily.    Dispense:  30 tablet    Refill:  3    Order Specific Question:   Supervising Provider    Answer:   Florian Buff [[id number]]       Plan:     Follow up in 3 months for BP check and ROS

## 2022-12-27 ENCOUNTER — Other Ambulatory Visit: Payer: Self-pay | Admitting: Physician Assistant

## 2022-12-28 ENCOUNTER — Ambulatory Visit: Payer: No Typology Code available for payment source | Attending: Physician Assistant | Admitting: Physician Assistant

## 2022-12-28 ENCOUNTER — Encounter: Payer: Self-pay | Admitting: Physician Assistant

## 2022-12-28 VITALS — BP 144/84 | HR 84

## 2022-12-28 DIAGNOSIS — M19072 Primary osteoarthritis, left ankle and foot: Secondary | ICD-10-CM

## 2022-12-28 DIAGNOSIS — M0579 Rheumatoid arthritis with rheumatoid factor of multiple sites without organ or systems involvement: Secondary | ICD-10-CM | POA: Diagnosis not present

## 2022-12-28 DIAGNOSIS — M25561 Pain in right knee: Secondary | ICD-10-CM

## 2022-12-28 DIAGNOSIS — M25511 Pain in right shoulder: Secondary | ICD-10-CM

## 2022-12-28 DIAGNOSIS — M19041 Primary osteoarthritis, right hand: Secondary | ICD-10-CM

## 2022-12-28 DIAGNOSIS — M7061 Trochanteric bursitis, right hip: Secondary | ICD-10-CM

## 2022-12-28 DIAGNOSIS — M19071 Primary osteoarthritis, right ankle and foot: Secondary | ICD-10-CM

## 2022-12-28 DIAGNOSIS — M19042 Primary osteoarthritis, left hand: Secondary | ICD-10-CM

## 2022-12-28 DIAGNOSIS — Z79899 Other long term (current) drug therapy: Secondary | ICD-10-CM | POA: Diagnosis not present

## 2022-12-28 DIAGNOSIS — M533 Sacrococcygeal disorders, not elsewhere classified: Secondary | ICD-10-CM

## 2022-12-28 DIAGNOSIS — G8929 Other chronic pain: Secondary | ICD-10-CM

## 2022-12-28 NOTE — Patient Instructions (Signed)
Standing Labs We placed an order today for your standing lab work.  Please have your standing labs drawn in May and every 3 months   Please have your labs drawn 2 weeks prior to your appointment so that the provider can discuss your lab results at your appointment.  Please note that you may see your imaging and lab results in MyChart before we have reviewed them. We will contact you once all results are reviewed. Please allow our office up to 72 hours to thoroughly review all of the results before contacting the office for clarification of your results.  Lab hours are:   Monday through Thursday from 8:00 am -12:30 pm and 1:00 pm-5:00 pm and Friday from 8:00 am-12:00 pm.  Please be advised, all patients with office appointments requiring lab work will take precedent over walk-in lab work.   Labs are drawn by [facility]. Please bring your co-pay at the time of your lab draw.  You may receive a bill from [facility] for your lab work.  Please note if you are on Hydroxychloroquine and and an order has been placed for a Hydroxychloroquine level, you will need to have it drawn 4 hours or more after your last dose.  If you wish to have your labs drawn at another location, please call the office 24 hours in advance so we can fax the orders.  The office is located at [address], [address], [place], [place] [address] No appointment is necessary.    If you have any questions regarding directions or hours of operation,  please call [phone].   As a reminder, please drink plenty of water prior to coming for your lab work. Thanks!  

## 2022-12-29 LAB — CBC WITH DIFFERENTIAL/PLATELET
Absolute Monocytes: 826 cells/uL (ref 200–950)
Basophils Absolute: 77 cells/uL (ref 0–200)
Basophils Relative: 0.8 %
Eosinophils Absolute: 125 cells/uL (ref 15–500)
Eosinophils Relative: 1.3 %
HCT: 41.1 % (ref 35.0–45.0)
Hemoglobin: 14 g/dL (ref 11.7–15.5)
Lymphs Abs: 2256 cells/uL (ref 850–3900)
MCH: 31.5 pg (ref 27.0–33.0)
MCHC: 34.1 g/dL (ref 32.0–36.0)
MCV: 92.4 fL (ref 80.0–100.0)
MPV: 10.4 fL (ref 7.5–12.5)
Monocytes Relative: 8.6 %
Neutro Abs: 6317 cells/uL (ref 1500–7800)
Neutrophils Relative %: 65.8 %
Platelets: 303 10*3/uL (ref 140–400)
RBC: 4.45 10*6/uL (ref 3.80–5.10)
RDW: 13 % (ref 11.0–15.0)
Total Lymphocyte: 23.5 %
WBC: 9.6 10*3/uL (ref 3.8–10.8)

## 2022-12-29 LAB — COMPLETE METABOLIC PANEL WITH GFR
AG Ratio: 1.5 (calc) (ref 1.0–2.5)
ALT: 23 U/L (ref 6–29)
AST: 18 U/L (ref 10–35)
Albumin: 4.6 g/dL (ref 3.6–5.1)
Alkaline phosphatase (APISO): 92 U/L (ref 37–153)
BUN: 11 mg/dL (ref 7–25)
CO2: 29 mmol/L (ref 20–32)
Calcium: 9.6 mg/dL (ref 8.6–10.4)
Chloride: 102 mmol/L (ref 98–110)
Creat: 0.76 mg/dL (ref 0.50–1.03)
Globulin: 3.1 g/dL (calc) (ref 1.9–3.7)
Glucose, Bld: 90 mg/dL (ref 65–99)
Potassium: 3.9 mmol/L (ref 3.5–5.3)
Sodium: 139 mmol/L (ref 135–146)
Total Bilirubin: 0.4 mg/dL (ref 0.2–1.2)
Total Protein: 7.7 g/dL (ref 6.1–8.1)
eGFR: 95 mL/min/{1.73_m2} (ref >=60)

## 2022-12-29 NOTE — Progress Notes (Signed)
CBC and CMP WNL

## 2023-01-18 ENCOUNTER — Other Ambulatory Visit: Payer: Self-pay | Admitting: Physician Assistant

## 2023-01-18 NOTE — Telephone Encounter (Signed)
Next Visit: 05/31/2023  Last Visit: 12/28/2022  Last Fill: 12/13/2022  DX: Rheumatoid arthritis with rheumatoid factor of multiple sites without organ or systems involvement   Current Dose per office note 12/28/2022: Methotrexate 7 tablets by mouth once weekly   Labs: 12/28/2022 CBC and CMP WNL   Okay to refill MTX?

## 2023-03-04 ENCOUNTER — Other Ambulatory Visit (INDEPENDENT_AMBULATORY_CARE_PROVIDER_SITE_OTHER): Payer: No Typology Code available for payment source

## 2023-03-04 ENCOUNTER — Telehealth: Payer: Self-pay | Admitting: Orthopaedic Surgery

## 2023-03-04 ENCOUNTER — Ambulatory Visit: Payer: No Typology Code available for payment source | Admitting: Orthopaedic Surgery

## 2023-03-04 ENCOUNTER — Encounter: Payer: Self-pay | Admitting: Orthopaedic Surgery

## 2023-03-04 DIAGNOSIS — M25552 Pain in left hip: Secondary | ICD-10-CM

## 2023-03-04 MED ORDER — DICLOFENAC SODIUM 75 MG PO TBEC: 75.0000 mg | DELAYED_RELEASE_TABLET | Freq: Two times a day (BID) | ORAL | 2 refills | Status: AC | PRN

## 2023-03-04 NOTE — Telephone Encounter (Signed)
Patient advising that she need a note stating she was seen today and to please put it in [facility].

## 2023-03-04 NOTE — Progress Notes (Unsigned)
Office Visit Note   Patient: [name]           Date of Birth: 07-26-1971           MRN: [id number] Visit Date: 03/04/2023              Requested by: Shawnie Dapper, PA-C [address] [place],  [place] [address] PCP: Shawnie Dapper, PA-C   Assessment & Plan: Visit Diagnoses:  1. Pain in left hip     Plan: Impression is left snapping hip syndrome.  At this point, would like to start the patient on NSAIDs and in outpatient physical therapy.  Referral has been made.  Follow-up with [place] as needed.  Follow-Up Instructions: Return if symptoms worsen or fail to improve.   Orders:  Orders Placed This Encounter  Procedures   XR HIP UNILAT W OR W/O PELVIS 2-3 VIEWS LEFT   No orders of the defined types were placed in this encounter.     Procedures: No procedures performed   Clinical Data: No additional findings.   Subjective: Chief Complaint  Patient presents with   Left Hip - Pain    HPI patient is a pleasant 53 year old female who comes in today with left hip pain.  She denies any injury or change in activity.  Pain she has is to the groin and is intermittent.  She has associated popping sensations with walking and certain exercises.  She has not taken medication for this.  Review of Systems as detailed in HPI.  All others reviewed and are negative.   Objective: Vital Signs: There were no vitals taken for this visit.  Physical Exam well-developed well-nourished female no acute distress.  Alert and oriented x 3.  Ortho Exam left hip exam: No pain with logroll, FADIR or [place].  No pain with resisted hip flexion abduction or abduction.  She does exhibit a popping sensation when she is standing and fully extending her hip such as when she is walking.  Specialty Comments:  No specialty comments available.  Imaging: XR HIP UNILAT W OR W/O PELVIS 2-3 VIEWS LEFT  Result Date: 03/04/2023 No acute or structural abnormalities    PMFS History: Patient  Active Problem List   Diagnosis Date Noted   Yeast infection 11/04/2022   Vaginal discharge 11/04/2022   Vaginal itching 11/04/2022   Hypertension 10/25/2022   IUD (intrauterine device) in place 10/25/2022   Pelvic pressure in female 10/25/2022   Hematuria 10/25/2022   Screen for STD (sexually transmitted disease) 10/25/2022   Routine cervical smear 10/25/2022   S/P arthroscopy of right shoulder 08/04/2022   Partial nontraumatic tear of right rotator cuff 05/26/2022   Arthritis of right acromioclavicular joint 05/26/2022   Chronic migraine w/o aura w/o status migrainosus, not intractable 11/13/2020   Fasciculation 11/13/2020   Multiple thyroid nodules 12/13/2018   Hair loss 12/13/2018   Encounter for IUD insertion 01/26/2018   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   Multiple acquired skin tags 07/14/2017   Rheumatoid arthritis with rheumatoid factor of multiple sites without organ or systems involvement 11/09/2016   High risk medication use 11/09/2016   Non compliance w medication regimen 11/09/2016   Constipation 06/16/2016   Obesity 05/16/2015   Past Medical History:  Diagnosis Date   Arthritis    BV (bacterial vaginosis)    Constipation 06/16/2016   HSV (herpes simplex virus) infection    Hypertension    IUD (intrauterine device) in place 03/20/2014   IUD inserted  01/23/13   Obesity    Osteoarthritis    RA (rheumatoid arthritis)    Trichomonas    Twitching     Family History  Problem Relation Age of Onset   Diabetes Mother    COPD Mother    Arthritis Mother    Hypertension Mother    Heart disease Mother    Congestive Heart Failure Mother    Hearing loss Father    Asthma Son    Cancer Maternal Grandmother        lung   Heart disease Paternal Grandmother        CHF    Past Surgical History:  Procedure Laterality Date   APPENDECTOMY     CHOLECYSTECTOMY     HAND SURGERY     OVARIAN CYST REMOVAL N/A 08/17/2007   SHOULDER ARTHROSCOPY  06/24/2022   TONSILLECTOMY      Social History   Occupational History   Occupation: Teaching laboratory technician  Tobacco Use   Smoking status: Never    Passive exposure: Current   Smokeless tobacco: Never  Vaping Use   Vaping Use: Never used  Substance and Sexual Activity   Alcohol use: Not Currently    Comment: 1 or 2 times per year   Drug use: Never   Sexual activity: Not Currently    Birth control/protection: I.U.D.

## 2023-03-17 ENCOUNTER — Other Ambulatory Visit: Payer: Self-pay | Admitting: Adult Health

## 2023-03-21 ENCOUNTER — Ambulatory Visit (HOSPITAL_COMMUNITY): Payer: No Typology Code available for payment source | Admitting: Physical Therapy

## 2023-03-22 ENCOUNTER — Ambulatory Visit: Payer: No Typology Code available for payment source | Admitting: Adult Health

## 2023-03-22 ENCOUNTER — Encounter: Payer: Self-pay | Admitting: Adult Health

## 2023-03-22 VITALS — BP 144/90 | HR 83 | Ht 61.5 in | Wt 212.0 lb

## 2023-03-22 DIAGNOSIS — I10 Essential (primary) hypertension: Secondary | ICD-10-CM

## 2023-03-22 HISTORY — PX: MOUTH SURGERY: SHX715

## 2023-03-22 MED ORDER — MECLIZINE HCL 25 MG PO TABS
25.0000 mg | ORAL_TABLET | Freq: Three times a day (TID) | ORAL | 1 refills | Status: AC | PRN
Start: 2023-03-22 — End: ?

## 2023-03-22 NOTE — Progress Notes (Signed)
  Subjective:     Patient ID: [name], female   DOB: Jun 18, 1971, 52 y.o.   MRN: [id number]  HPI [name] is a 52 year old balck female,single G2P1011, in for a bP check she is taking norvasc 5 mg. She has lost about 32 1/2 lbs since December, going to bariatric clinic, and taking phentermine. She had tooth pulled this afternoon.  Last pap was negative HPV,NILM 10/25/22. PCP is Dwyane Luo PA.   Review of Systems Has had tooth ache, and ear hurt since the weekend, could not eat Has felt dizzy at times too Reviewed past medical,surgical, social and family history. Reviewed medications and allergies.     Objective:   Physical Exam BP (!) 144/90 (BP Location: Right Arm, Patient Position: Sitting, Cuff Size: Large)   Pulse 83   Ht 5' 1.5" (1.562 m)   Wt 212 lb (96.2 kg)   BMI 39.41 kg/m  Skin warm and dry. Lungs: clear to ausculation bilaterally. Cardiovascular: regular rate and rhythm.    Fall risk is low  Upstream - 03/22/23 1606       Pregnancy Intention Screening   Does the patient want to become pregnant in the next year? No    Does the patient's partner want to become pregnant in the next year? No    Would the patient like to discuss contraceptive options today? No      Contraception Wrap Up   Current Method IUD or IUS    End Method IUD or IUS             Assessment:     1. Hypertension, unspecified type Continue Norvasc 5 mg 1 daily has refills Continue weight loss efforts Will rx antivert at her request, it is OTC too Meds ordered this encounter  Medications   meclizine (ANTIVERT) 25 MG tablet    Sig: Take 1 tablet (25 mg total) by mouth 3 (three) times daily as needed for dizziness.    Dispense:  30 tablet    Refill:  1    Order Specific Question:   Supervising Provider    Answer:   Lazaro Arms [[id number]]       Plan:     Follow up in 3 months for BP check and ROS

## 2023-04-28 ENCOUNTER — Other Ambulatory Visit: Payer: Self-pay | Admitting: Physician Assistant

## 2023-04-28 ENCOUNTER — Other Ambulatory Visit: Payer: Self-pay | Admitting: *Deleted

## 2023-04-28 DIAGNOSIS — Z79899 Other long term (current) drug therapy: Secondary | ICD-10-CM

## 2023-04-28 NOTE — Telephone Encounter (Signed)
Last Fill: 01/18/2023  Labs: 12/28/2022 CBC and CMP WNL   Next Visit: 05/31/2023  Last Visit: 12/28/2022  DX: Rheumatoid arthritis with rheumatoid factor of multiple sites without organ or systems involvement   Current Dose per office note 12/28/2022: Methotrexate 7 tablets by mouth once weekly   Patient advised she is due to update labs. Patient states she will try to get by the office today to update her labs.   Okay to refill Methotrexate?

## 2023-04-29 LAB — CBC WITH DIFFERENTIAL/PLATELET
Absolute Monocytes: 755 cells/uL (ref 200–950)
Basophils Absolute: 102 cells/uL (ref 0–200)
Basophils Relative: 1 %
Eosinophils Absolute: 184 cells/uL (ref 15–500)
Eosinophils Relative: 1.8 %
HCT: 42.7 % (ref 35.0–45.0)
Hemoglobin: 14.2 g/dL (ref 11.7–15.5)
Lymphs Abs: 2540 cells/uL (ref 850–3900)
MCH: 30.7 pg (ref 27.0–33.0)
MCHC: 33.3 g/dL (ref 32.0–36.0)
MCV: 92.4 fL (ref 80.0–100.0)
MPV: 10.3 fL (ref 7.5–12.5)
Monocytes Relative: 7.4 %
Neutro Abs: 6620 cells/uL (ref 1500–7800)
Neutrophils Relative %: 64.9 %
Platelets: 323 10*3/uL (ref 140–400)
RBC: 4.62 10*6/uL (ref 3.80–5.10)
RDW: 13.1 % (ref 11.0–15.0)
Total Lymphocyte: 24.9 %
WBC: 10.2 10*3/uL (ref 3.8–10.8)

## 2023-04-29 LAB — COMPLETE METABOLIC PANEL WITH GFR
AG Ratio: 1.8 (calc) (ref 1.0–2.5)
ALT: 65 U/L — ABNORMAL HIGH (ref 6–29)
AST: 42 U/L — ABNORMAL HIGH (ref 10–35)
Albumin: 4.9 g/dL (ref 3.6–5.1)
Alkaline phosphatase (APISO): 107 U/L (ref 37–153)
BUN: 8 mg/dL (ref 7–25)
CO2: 30 mmol/L (ref 20–32)
Calcium: 10.1 mg/dL (ref 8.6–10.4)
Chloride: 98 mmol/L (ref 98–110)
Creat: 0.72 mg/dL (ref 0.50–1.03)
Globulin: 2.8 g/dL (calc) (ref 1.9–3.7)
Glucose, Bld: 107 mg/dL — ABNORMAL HIGH (ref 65–99)
Potassium: 3.8 mmol/L (ref 3.5–5.3)
Sodium: 137 mmol/L (ref 135–146)
Total Bilirubin: 0.5 mg/dL (ref 0.2–1.2)
Total Protein: 7.7 g/dL (ref 6.1–8.1)
eGFR: 101 mL/min/{1.73_m2} (ref >=60)

## 2023-04-29 NOTE — Progress Notes (Signed)
CBC WNL Glucose is 107.  AST and ALT are elevated. Rest of CMP WNL.  Please clarify if she is taking any tylenol, NSAIDs, or alcohol use?

## 2023-05-11 ENCOUNTER — Other Ambulatory Visit: Payer: Self-pay | Admitting: Physician Assistant

## 2023-05-17 NOTE — Progress Notes (Deleted)
Office Visit Note  Patient: [name]             Date of Birth: May 07, 1971           MRN: [id number]             PCP: Myna Hidalgo, DO Referring: Samuella Bruin Visit Date: 05/31/2023 Occupation: @GUAROCC @  Subjective:    History of Present Illness: [name] is a 52 y.o. female with history of seropositive rheumatoid arthritis and osteoarthritis.  Patient remains on Methotrexate 7 tablets by mouth once weekly and folic acid 2 mg by mouth daily.   CBC and CMP updated on 04/28/23.  Discussed the importance of holding methotrexate if she develops signs or symptoms of an infection and to resume once the infection has completely cleared.   Activities of Daily Living:  Patient reports morning stiffness for *** {minute/hour:19697}.   Patient {ACTIONS;DENIES/REPORTS:21021675::"Denies"} nocturnal pain.  Difficulty dressing/grooming: {ACTIONS;DENIES/REPORTS:21021675::"Denies"} Difficulty climbing stairs: {ACTIONS;DENIES/REPORTS:21021675::"Denies"} Difficulty getting out of chair: {ACTIONS;DENIES/REPORTS:21021675::"Denies"} Difficulty using hands for taps, buttons, cutlery, and/or writing: {ACTIONS;DENIES/REPORTS:21021675::"Denies"}  No Rheumatology ROS completed.   PMFS History:  Patient Active Problem List   Diagnosis Date Noted   Yeast infection 11/04/2022   Vaginal discharge 11/04/2022   Vaginal itching 11/04/2022   Hypertension 10/25/2022   IUD (intrauterine device) in place 10/25/2022   Pelvic pressure in female 10/25/2022   Hematuria 10/25/2022   Screen for STD (sexually transmitted disease) 10/25/2022   Routine cervical smear 10/25/2022   S/P arthroscopy of right shoulder 08/04/2022   Partial nontraumatic tear of right rotator cuff 05/26/2022   Arthritis of right acromioclavicular joint 05/26/2022   Chronic migraine w/o aura w/o status migrainosus, not intractable 11/13/2020   Fasciculation 11/13/2020   Multiple thyroid nodules 12/13/2018    Hair loss 12/13/2018   Encounter for IUD insertion 01/26/2018   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   Multiple acquired skin tags 07/14/2017   Rheumatoid arthritis with rheumatoid factor of multiple sites without organ or systems involvement (HCC) 11/09/2016   High risk medication use 11/09/2016   Non compliance w medication regimen 11/09/2016   Constipation 06/16/2016   Obesity 05/16/2015    Past Medical History:  Diagnosis Date   Arthritis    BV (bacterial vaginosis)    Constipation 06/16/2016   HSV (herpes simplex virus) infection    Hypertension    IUD (intrauterine device) in place 03/20/2014   IUD inserted 01/23/13   Obesity    Osteoarthritis    RA (rheumatoid arthritis) (HCC)    Trichomonas    Twitching     Family History  Problem Relation Age of Onset   Diabetes Mother    COPD Mother    Arthritis Mother    Hypertension Mother    Heart disease Mother    Congestive Heart Failure Mother    Hearing loss Father    Asthma Son    Cancer Maternal Grandmother        lung   Heart disease Paternal Grandmother        CHF   Past Surgical History:  Procedure Laterality Date   APPENDECTOMY     CHOLECYSTECTOMY     HAND SURGERY     MOUTH SURGERY  03/22/2023   tooth removed   OVARIAN CYST REMOVAL N/A 08/17/2007   SHOULDER ARTHROSCOPY  06/24/2022   TONSILLECTOMY     Social History   Social History Narrative   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   Has children.   Does  not smoke.   Eats meat fruits and vegetables.   Wears her seatbelt.      Lives at home with her son.   Right-handed.   One cup caffeine, three times per week.   Immunization History  Administered Date(s) Administered   PFIZER(Purple Top)SARS-COV-2 Vaccination 07/02/2020, 07/23/2020     Objective: Vital Signs: There were no vitals taken for this visit.   Physical Exam Vitals and nursing note reviewed.  Constitutional:      Appearance: She is well-developed.  HENT:     Head: Normocephalic and atraumatic.   Eyes:     Conjunctiva/sclera: Conjunctivae normal.  Cardiovascular:     Rate and Rhythm: Normal rate and regular rhythm.     Heart sounds: Normal heart sounds.  Pulmonary:     Effort: Pulmonary effort is normal.     Breath sounds: Normal breath sounds.  Abdominal:     General: Bowel sounds are normal.     Palpations: Abdomen is soft.  Musculoskeletal:     Cervical back: Normal range of motion.  Lymphadenopathy:     Cervical: No cervical adenopathy.  Skin:    General: Skin is warm and dry.     Capillary Refill: Capillary refill takes less than 2 seconds.  Neurological:     Mental Status: She is alert and oriented to person, place, and time.  Psychiatric:        Behavior: Behavior normal.      Musculoskeletal Exam: ***  CDAI Exam: CDAI Score: -- Patient Global: --; Provider Global: -- Swollen: --; Tender: -- Joint Exam 05/31/2023   No joint exam has been documented for this visit   There is currently no information documented on the homunculus. Go to the Rheumatology activity and complete the homunculus joint exam.  Investigation: No additional findings.  Imaging: No results found.  Recent Labs: Lab Results  Component Value Date   WBC 10.2 04/28/2023   HGB 14.2 04/28/2023   PLT 323 04/28/2023   NA 137 04/28/2023   K 3.8 04/28/2023   CL 98 04/28/2023   CO2 30 04/28/2023   GLUCOSE 107 (H) 04/28/2023   BUN 8 04/28/2023   CREATININE 0.72 04/28/2023   BILITOT 0.5 04/28/2023   ALKPHOS 75 06/17/2017   AST 42 (H) 04/28/2023   ALT 65 (H) 04/28/2023   PROT 7.7 04/28/2023   ALBUMIN 4.5 06/17/2017   CALCIUM 10.1 04/28/2023   GFRAA 123 01/14/2021    Speciality Comments: No specialty comments available.  Procedures:  No procedures performed Allergies: Contrast media [iodinated contrast media]   Assessment / Plan:     Visit Diagnoses: Rheumatoid arthritis with rheumatoid factor of multiple sites without organ or systems involvement (HCC)  High risk  medication use  Primary osteoarthritis of both hands  Chronic right shoulder pain  Chronic pain of right knee  Primary osteoarthritis of both feet  Chronic SI joint pain  Trochanteric bursitis, right hip  Orders: No orders of the defined types were placed in this encounter.  No orders of the defined types were placed in this encounter.   Face-to-face time spent with patient was *** minutes. Greater than 50% of time was spent in counseling and coordination of care.  Follow-Up Instructions: No follow-ups on file.   Gearldine Bienenstock, PA-C  Note - This record has been created using Dragon software.  Chart creation errors have been sought, but may not always  have been located. Such creation errors do not reflect on  the standard of  medical care.

## 2023-05-31 ENCOUNTER — Ambulatory Visit: Payer: No Typology Code available for payment source | Admitting: Physician Assistant

## 2023-05-31 DIAGNOSIS — M7061 Trochanteric bursitis, right hip: Secondary | ICD-10-CM

## 2023-05-31 DIAGNOSIS — M19041 Primary osteoarthritis, right hand: Secondary | ICD-10-CM

## 2023-05-31 DIAGNOSIS — Z79899 Other long term (current) drug therapy: Secondary | ICD-10-CM

## 2023-05-31 DIAGNOSIS — M19071 Primary osteoarthritis, right ankle and foot: Secondary | ICD-10-CM

## 2023-05-31 DIAGNOSIS — M0579 Rheumatoid arthritis with rheumatoid factor of multiple sites without organ or systems involvement: Secondary | ICD-10-CM

## 2023-05-31 DIAGNOSIS — G8929 Other chronic pain: Secondary | ICD-10-CM

## 2023-06-01 ENCOUNTER — Telehealth: Payer: Self-pay | Admitting: *Deleted

## 2023-06-01 NOTE — Telephone Encounter (Signed)
Contacted patient to advised her FMLA paperwork has been completed and faxed. Patient advised her copy is at the front desk for pick up. Patient rescheduled her follow up appointment.

## 2023-06-06 NOTE — Progress Notes (Deleted)
Office Visit Note  Patient: [name]             Date of Birth: September 14, 1971           MRN: [id number]             PCP: Myna Hidalgo, DO Referring: Myna Hidalgo, DO Visit Date: 06/20/2023 Occupation: @GUAROCC @  Subjective:    History of Present Illness: [name] is a 52 y.o. female with history of seronegative rheumatoid arthritis and osteoarthritis.  Patient remains on Methotrexate 7 tablets by mouth once weekly and folic acid 2 mg by mouth daily.   CBC and CMP updated on 04/28/23.her next lab work will be due in September and every 3 months. Discussed the importance of holding methotrexate if she develops signs or symptoms of an infection and to resume once the infection has completely cleared.    Activities of Daily Living:  Patient reports morning stiffness for *** {minute/hour:19697}.   Patient {ACTIONS;DENIES/REPORTS:21021675::"Denies"} nocturnal pain.  Difficulty dressing/grooming: {ACTIONS;DENIES/REPORTS:21021675::"Denies"} Difficulty climbing stairs: {ACTIONS;DENIES/REPORTS:21021675::"Denies"} Difficulty getting out of chair: {ACTIONS;DENIES/REPORTS:21021675::"Denies"} Difficulty using hands for taps, buttons, cutlery, and/or writing: {ACTIONS;DENIES/REPORTS:21021675::"Denies"}  No Rheumatology ROS completed.   PMFS History:  Patient Active Problem List   Diagnosis Date Noted   Yeast infection 11/04/2022   Vaginal discharge 11/04/2022   Vaginal itching 11/04/2022   Hypertension 10/25/2022   IUD (intrauterine device) in place 10/25/2022   Pelvic pressure in female 10/25/2022   Hematuria 10/25/2022   Screen for STD (sexually transmitted disease) 10/25/2022   Routine cervical smear 10/25/2022   S/P arthroscopy of right shoulder 08/04/2022   Partial nontraumatic tear of right rotator cuff 05/26/2022   Arthritis of right acromioclavicular joint 05/26/2022   Chronic migraine w/o aura w/o status migrainosus, not intractable 11/13/2020    Fasciculation 11/13/2020   Multiple thyroid nodules 12/13/2018   Hair loss 12/13/2018   Encounter for IUD insertion 01/26/2018   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   Multiple acquired skin tags 07/14/2017   Rheumatoid arthritis with rheumatoid factor of multiple sites without organ or systems involvement (HCC) 11/09/2016   High risk medication use 11/09/2016   Non compliance w medication regimen 11/09/2016   Constipation 06/16/2016   Obesity 05/16/2015    Past Medical History:  Diagnosis Date   Arthritis    BV (bacterial vaginosis)    Constipation 06/16/2016   HSV (herpes simplex virus) infection    Hypertension    IUD (intrauterine device) in place 03/20/2014   IUD inserted 01/23/13   Obesity    Osteoarthritis    RA (rheumatoid arthritis) (HCC)    Trichomonas    Twitching     Family History  Problem Relation Age of Onset   Diabetes Mother    COPD Mother    Arthritis Mother    Hypertension Mother    Heart disease Mother    Congestive Heart Failure Mother    Hearing loss Father    Asthma Son    Cancer Maternal Grandmother        lung   Heart disease Paternal Grandmother        CHF   Past Surgical History:  Procedure Laterality Date   APPENDECTOMY     CHOLECYSTECTOMY     HAND SURGERY     MOUTH SURGERY  03/22/2023   tooth removed   OVARIAN CYST REMOVAL N/A 08/17/2007   SHOULDER ARTHROSCOPY  06/24/2022   TONSILLECTOMY     Social History   Social History Narrative   Works  at a [employer].   Has children.   Does not smoke.   Eats meat fruits and vegetables.   Wears her seatbelt.      Lives at home with her son.   Right-handed.   One cup caffeine, three times per week.   Immunization History  Administered Date(s) Administered   PFIZER(Purple Top)SARS-COV-2 Vaccination 07/02/2020, 07/23/2020     Objective: Vital Signs: There were no vitals taken for this visit.   Physical Exam Vitals and nursing note reviewed.  Constitutional:      Appearance: She is  well-developed.  HENT:     Head: Normocephalic and atraumatic.  Eyes:     Conjunctiva/sclera: Conjunctivae normal.  Cardiovascular:     Rate and Rhythm: Normal rate and regular rhythm.     Heart sounds: Normal heart sounds.  Pulmonary:     Effort: Pulmonary effort is normal.     Breath sounds: Normal breath sounds.  Abdominal:     General: Bowel sounds are normal.     Palpations: Abdomen is soft.  Musculoskeletal:     Cervical back: Normal range of motion.  Lymphadenopathy:     Cervical: No cervical adenopathy.  Skin:    General: Skin is warm and dry.     Capillary Refill: Capillary refill takes less than 2 seconds.  Neurological:     Mental Status: She is alert and oriented to person, place, and time.  Psychiatric:        Behavior: Behavior normal.      Musculoskeletal Exam: ***  CDAI Exam: CDAI Score: -- Patient Global: --; Provider Global: -- Swollen: --; Tender: -- Joint Exam 06/20/2023   No joint exam has been documented for this visit   There is currently no information documented on the homunculus. Go to the Rheumatology activity and complete the homunculus joint exam.  Investigation: No additional findings.  Imaging: No results found.  Recent Labs: Lab Results  Component Value Date   WBC 10.2 04/28/2023   HGB 14.2 04/28/2023   PLT 323 04/28/2023   NA 137 04/28/2023   K 3.8 04/28/2023   CL 98 04/28/2023   CO2 30 04/28/2023   GLUCOSE 107 (H) 04/28/2023   BUN 8 04/28/2023   CREATININE 0.72 04/28/2023   BILITOT 0.5 04/28/2023   ALKPHOS 75 06/17/2017   AST 42 (H) 04/28/2023   ALT 65 (H) 04/28/2023   PROT 7.7 04/28/2023   ALBUMIN 4.5 06/17/2017   CALCIUM 10.1 04/28/2023   GFRAA 123 01/14/2021    Speciality Comments: No specialty comments available.  Procedures:  No procedures performed Allergies: Contrast media [iodinated contrast media]   Assessment / Plan:     Visit Diagnoses: No diagnosis found.  Orders: No orders of the defined  types were placed in this encounter.  No orders of the defined types were placed in this encounter.   Face-to-face time spent with patient was *** minutes. Greater than 50% of time was spent in counseling and coordination of care.  Follow-Up Instructions: No follow-ups on file.   Ellen Henri, CMA  Note - This record has been created using Animal nutritionist.  Chart creation errors have been sought, but may not always  have been located. Such creation errors do not reflect on  the standard of medical care.

## 2023-06-20 ENCOUNTER — Ambulatory Visit: Payer: No Typology Code available for payment source | Admitting: Physician Assistant

## 2023-06-20 DIAGNOSIS — M19041 Primary osteoarthritis, right hand: Secondary | ICD-10-CM

## 2023-06-20 DIAGNOSIS — M19071 Primary osteoarthritis, right ankle and foot: Secondary | ICD-10-CM

## 2023-06-20 DIAGNOSIS — Z79899 Other long term (current) drug therapy: Secondary | ICD-10-CM

## 2023-06-20 DIAGNOSIS — G8929 Other chronic pain: Secondary | ICD-10-CM

## 2023-06-20 DIAGNOSIS — M7061 Trochanteric bursitis, right hip: Secondary | ICD-10-CM

## 2023-06-20 DIAGNOSIS — M0579 Rheumatoid arthritis with rheumatoid factor of multiple sites without organ or systems involvement: Secondary | ICD-10-CM

## 2023-06-22 ENCOUNTER — Encounter: Payer: Self-pay | Admitting: Adult Health

## 2023-06-22 ENCOUNTER — Ambulatory Visit: Payer: No Typology Code available for payment source | Admitting: Adult Health

## 2023-06-22 VITALS — BP 148/90 | HR 83 | Ht 62.0 in | Wt 213.0 lb

## 2023-06-22 DIAGNOSIS — L910 Hypertrophic scar: Secondary | ICD-10-CM | POA: Insufficient documentation

## 2023-06-22 DIAGNOSIS — D171 Benign lipomatous neoplasm of skin and subcutaneous tissue of trunk: Secondary | ICD-10-CM | POA: Diagnosis not present

## 2023-06-22 DIAGNOSIS — I10 Essential (primary) hypertension: Secondary | ICD-10-CM | POA: Diagnosis not present

## 2023-06-22 NOTE — Progress Notes (Signed)
  Subjective:     Patient ID: [name], female   DOB: Feb 16, 1971, 52 y.o.   MRN: [id number]  HPI [name] is a 52 year old black female,single, G2P1011 in for BP check she is on Norvasc 5 mg 1 daily. She says BP was 128/78 yesterday at bariatric center.      Component Value Date/Time   DIAGPAP  10/25/2022 1413    - Negative for intraepithelial lesion or malignancy (NILM)   DIAGPAP  12/13/2018 0000    NEGATIVE FOR INTRAEPITHELIAL LESIONS OR MALIGNANCY.   HPVHIGH Negative 10/25/2022 1413   ADEQPAP  10/25/2022 1413    Satisfactory for evaluation; transformation zone component PRESENT.   ADEQPAP  12/13/2018 0000    Satisfactory for evaluation  endocervical/transformation zone component PRESENT.     Review of Systems Has keloid on stomach and lipoma on left back area Reviewed past medical,surgical, social and family history. Reviewed medications and allergies.     Objective:   Physical Exam BP (!) 148/90 (BP Location: Right Arm, Patient Position: Sitting, Cuff Size: Large)   Pulse 83   Ht 5\' 2"  (1.575 m)   Wt 213 lb (96.6 kg)   BMI 38.96 kg/m     Skin warm and dry.  Lungs: clear to ausculation bilaterally. Cardiovascular: regular rate and rhythm.  Has 1 cm area right abdomen soft and fleshy, has 3-4 cm soft spongy mass left shoulder blade area. Fall risk is low  Upstream - 06/22/23 1609       Pregnancy Intention Screening   Does the patient want to become pregnant in the next year? No    Does the patient's partner want to become pregnant in the next year? No    Would the patient like to discuss contraceptive options today? No      Contraception Wrap Up   Current Method IUD or IUS    End Method IUD or IUS    Contraception Counseling Provided Yes             Assessment:     1. Hypertension, unspecified type Continue Norvasc has refills Continue weight loss efforts Watch salt   2. Keloid ? Keloid right abdomen, will refer for surgical evaluation  -  Ambulatory referral to General Surgery  3. Lipoma of back Has 3-4 cm soft area left scapula, ?lipoma will get get surgical consult  - Ambulatory referral to General Surgery     Plan:     Follow up in 3 months with me for ROS and BP check

## 2023-07-04 NOTE — Progress Notes (Deleted)
Office Visit Note  Patient: [name]             Date of Birth: 1971/05/24           MRN: [id number]             PCP: Myna Hidalgo, DO Referring: Myna Hidalgo, DO Visit Date: 07/05/2023 Occupation: @GUAROCC @  Subjective:    History of Present Illness: [name] is a 52 y.o. female with history of seropositive rheumatoid arthritis and osteoarthritis.  Patient remain on  Methotrexate 7 tablets by mouth once weekly and folic acid 2 mg by mouth daily.    CBC and CMP updated on 04/28/23.  Orders for CBC and CMP released today.   Discussed the importance of holding methotrexate if she develops signs or symptoms of an infection and to resume once the infection has completely cleared.   Activities of Daily Living:  Patient reports morning stiffness for *** {minute/hour:19697}.   Patient {ACTIONS;DENIES/REPORTS:21021675::"Denies"} nocturnal pain.  Difficulty dressing/grooming: {ACTIONS;DENIES/REPORTS:21021675::"Denies"} Difficulty climbing stairs: {ACTIONS;DENIES/REPORTS:21021675::"Denies"} Difficulty getting out of chair: {ACTIONS;DENIES/REPORTS:21021675::"Denies"} Difficulty using hands for taps, buttons, cutlery, and/or writing: {ACTIONS;DENIES/REPORTS:21021675::"Denies"}  No Rheumatology ROS completed.   PMFS History:  Patient Active Problem List   Diagnosis Date Noted   Keloid 06/22/2023   Lipoma of back 06/22/2023   Yeast infection 11/04/2022   Vaginal discharge 11/04/2022   Vaginal itching 11/04/2022   Hypertension 10/25/2022   IUD (intrauterine device) in place 10/25/2022   Pelvic pressure in female 10/25/2022   Hematuria 10/25/2022   Screen for STD (sexually transmitted disease) 10/25/2022   Routine cervical smear 10/25/2022   S/P arthroscopy of right shoulder 08/04/2022   Partial nontraumatic tear of right rotator cuff 05/26/2022   Arthritis of right acromioclavicular joint 05/26/2022   Chronic migraine w/o aura w/o status migrainosus, not  intractable 11/13/2020   Fasciculation 11/13/2020   Multiple thyroid nodules 12/13/2018   Hair loss 12/13/2018   Encounter for IUD insertion 01/26/2018   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   Multiple acquired skin tags 07/14/2017   Rheumatoid arthritis with rheumatoid factor of multiple sites without organ or systems involvement (HCC) 11/09/2016   High risk medication use 11/09/2016   Non compliance w medication regimen 11/09/2016   Constipation 06/16/2016   Obesity 05/16/2015    Past Medical History:  Diagnosis Date   Arthritis    BV (bacterial vaginosis)    Constipation 06/16/2016   HSV (herpes simplex virus) infection    Hypertension    IUD (intrauterine device) in place 03/20/2014   IUD inserted 01/23/13   Obesity    Osteoarthritis    RA (rheumatoid arthritis) (HCC)    Trichomonas    Twitching     Family History  Problem Relation Age of Onset   Diabetes Mother    COPD Mother    Arthritis Mother    Hypertension Mother    Heart disease Mother    Congestive Heart Failure Mother    Hearing loss Father    Asthma Son    Cancer Maternal Grandmother        lung   Heart disease Paternal Grandmother        CHF   Past Surgical History:  Procedure Laterality Date   APPENDECTOMY     CHOLECYSTECTOMY     HAND SURGERY     MOUTH SURGERY  03/22/2023   tooth removed   OVARIAN CYST REMOVAL N/A 08/17/2007   SHOULDER ARTHROSCOPY  06/24/2022   TONSILLECTOMY     Social  History   Social History Narrative   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   Has children.   Does not smoke.   Eats meat fruits and vegetables.   Wears her seatbelt.      Lives at home with her son.   Right-handed.   One cup caffeine, three times per week.   Immunization History  Administered Date(s) Administered   PFIZER(Purple Top)SARS-COV-2 Vaccination 07/02/2020, 07/23/2020     Objective: Vital Signs: There were no vitals taken for this visit.   Physical Exam Vitals and nursing note reviewed.   Constitutional:      Appearance: She is well-developed.  HENT:     Head: Normocephalic and atraumatic.  Eyes:     Conjunctiva/sclera: Conjunctivae normal.  Cardiovascular:     Rate and Rhythm: Normal rate and regular rhythm.     Heart sounds: Normal heart sounds.  Pulmonary:     Effort: Pulmonary effort is normal.     Breath sounds: Normal breath sounds.  Abdominal:     General: Bowel sounds are normal.     Palpations: Abdomen is soft.  Musculoskeletal:     Cervical back: Normal range of motion.  Lymphadenopathy:     Cervical: No cervical adenopathy.  Skin:    General: Skin is warm and dry.     Capillary Refill: Capillary refill takes less than 2 seconds.  Neurological:     Mental Status: She is alert and oriented to person, place, and time.  Psychiatric:        Behavior: Behavior normal.      Musculoskeletal Exam: ***  CDAI Exam: CDAI Score: -- Patient Global: --; Provider Global: -- Swollen: --; Tender: -- Joint Exam 07/05/2023   No joint exam has been documented for this visit   There is currently no information documented on the homunculus. Go to the Rheumatology activity and complete the homunculus joint exam.  Investigation: No additional findings.  Imaging: No results found.  Recent Labs: Lab Results  Component Value Date   WBC 10.2 04/28/2023   HGB 14.2 04/28/2023   PLT 323 04/28/2023   NA 137 04/28/2023   K 3.8 04/28/2023   CL 98 04/28/2023   CO2 30 04/28/2023   GLUCOSE 107 (H) 04/28/2023   BUN 8 04/28/2023   CREATININE 0.72 04/28/2023   BILITOT 0.5 04/28/2023   ALKPHOS 75 06/17/2017   AST 42 (H) 04/28/2023   ALT 65 (H) 04/28/2023   PROT 7.7 04/28/2023   ALBUMIN 4.5 06/17/2017   CALCIUM 10.1 04/28/2023   GFRAA 123 01/14/2021    Speciality Comments: No specialty comments available.  Procedures:  No procedures performed Allergies: Contrast media [iodinated contrast media]   Assessment / Plan:     Visit Diagnoses: Rheumatoid  arthritis with rheumatoid factor of multiple sites without organ or systems involvement (HCC)  High risk medication use  Primary osteoarthritis of both hands  Chronic right shoulder pain  Chronic pain of right knee  Primary osteoarthritis of both feet  Chronic SI joint pain  Trochanteric bursitis, right hip  Orders: No orders of the defined types were placed in this encounter.  No orders of the defined types were placed in this encounter.   Face-to-face time spent with patient was *** minutes. Greater than 50% of time was spent in counseling and coordination of care.  Follow-Up Instructions: No follow-ups on file.   Gearldine Bienenstock, PA-C  Note - This record has been created using Dragon software.  Chart creation errors have been  sought, but may not always  have been located. Such creation errors do not reflect on  the standard of medical care.

## 2023-07-05 ENCOUNTER — Ambulatory Visit: Payer: No Typology Code available for payment source | Admitting: Physician Assistant

## 2023-07-05 ENCOUNTER — Encounter: Payer: Self-pay | Admitting: *Deleted

## 2023-07-05 DIAGNOSIS — M7061 Trochanteric bursitis, right hip: Secondary | ICD-10-CM

## 2023-07-05 DIAGNOSIS — M19071 Primary osteoarthritis, right ankle and foot: Secondary | ICD-10-CM

## 2023-07-05 DIAGNOSIS — Z79899 Other long term (current) drug therapy: Secondary | ICD-10-CM

## 2023-07-05 DIAGNOSIS — G8929 Other chronic pain: Secondary | ICD-10-CM

## 2023-07-05 DIAGNOSIS — M19042 Primary osteoarthritis, left hand: Secondary | ICD-10-CM

## 2023-07-05 DIAGNOSIS — M0579 Rheumatoid arthritis with rheumatoid factor of multiple sites without organ or systems involvement: Secondary | ICD-10-CM

## 2023-07-11 ENCOUNTER — Telehealth: Payer: Self-pay | Admitting: Rheumatology

## 2023-07-11 NOTE — Telephone Encounter (Signed)
Patient called requesting to speak with our supervisor regarding being dismissed from our office.

## 2023-07-11 NOTE — Telephone Encounter (Signed)
I called patient, I explained to patient that we are unable to continue to provide quality care for patients who are on high risk medications that continue to no show their appts and are non compliant. Patient has FMLA for appts. Patient verbalized understanding. Patient had 3 consecutive no show appts and FMLA paperwork completed during this time.

## 2023-07-16 ENCOUNTER — Ambulatory Visit
Admission: RE | Admit: 2023-07-16 | Discharge: 2023-07-16 | Disposition: A | Discharge status: Home / Self Care | Payer: No Typology Code available for payment source | Source: Ambulatory Visit | Attending: Family Medicine

## 2023-07-16 ENCOUNTER — Other Ambulatory Visit: Payer: Self-pay

## 2023-07-16 VITALS — BP 137/87 | HR 94 | Temp 98.1°F | Resp 20

## 2023-07-16 DIAGNOSIS — N760 Acute vaginitis: Secondary | ICD-10-CM | POA: Diagnosis present

## 2023-07-16 MED ORDER — METRONIDAZOLE 500 MG PO TABS: 500.0000 mg | ORAL_TABLET | Freq: Two times a day (BID) | ORAL | 0 refills | Status: DC

## 2023-07-16 NOTE — ED Provider Notes (Signed)
[facility]    CSN: [id number] Arrival date & time: 07/16/23  1249      History   Chief Complaint Chief Complaint  Patient presents with   Vaginal Discharge    Entered by patient    HPI [name] is a 52 y.o. female.   Presenting today with several day history of vaginal discharge, odor, occasional itching.  Denies rashes or lesions, pelvic or abdominal pain, nausea, vomiting, urinary symptoms.  So far not tried anything over-the-counter for symptoms.  Recently had a new sexual partner so wanting to get checked for STIs.    Past Medical History:  Diagnosis Date   Arthritis    BV (bacterial vaginosis)    Constipation 06/16/2016   HSV (herpes simplex virus) infection    Hypertension    IUD (intrauterine device) in place 03/20/2014   IUD inserted 01/23/13   Obesity    Osteoarthritis    RA (rheumatoid arthritis) (HCC)    Trichomonas    Twitching     Patient Active Problem List   Diagnosis Date Noted   Keloid 06/22/2023   Lipoma of back 06/22/2023   Yeast infection 11/04/2022   Vaginal discharge 11/04/2022   Vaginal itching 11/04/2022   Hypertension 10/25/2022   IUD (intrauterine device) in place 10/25/2022   Pelvic pressure in female 10/25/2022   Hematuria 10/25/2022   Screen for STD (sexually transmitted disease) 10/25/2022   Routine cervical smear 10/25/2022   S/P arthroscopy of right shoulder 08/04/2022   Partial nontraumatic tear of right rotator cuff 05/26/2022   Arthritis of right acromioclavicular joint 05/26/2022   Chronic migraine w/o aura w/o status migrainosus, not intractable 11/13/2020   Fasciculation 11/13/2020   Multiple thyroid nodules 12/13/2018   Hair loss 12/13/2018   Encounter for IUD insertion 01/26/2018   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   Multiple acquired skin tags 07/14/2017   Rheumatoid arthritis with rheumatoid factor of multiple sites without organ or systems involvement (HCC) 11/09/2016   High risk medication  use 11/09/2016   Non compliance w medication regimen 11/09/2016   Constipation 06/16/2016   Obesity 05/16/2015    Past Surgical History:  Procedure Laterality Date   APPENDECTOMY     CHOLECYSTECTOMY     HAND SURGERY     MOUTH SURGERY  03/22/2023   tooth removed   OVARIAN CYST REMOVAL N/A 08/17/2007   SHOULDER ARTHROSCOPY  06/24/2022   TONSILLECTOMY      OB History     Gravida  2   Para  1   Term  1   Preterm      AB  1   Living  1      SAB      IAB  1   Ectopic      Multiple      Live Births  1            Home Medications    Prior to Admission medications   Medication Sig Start Date End Date Taking? Authorizing Provider  metroNIDAZOLE (FLAGYL) 500 MG tablet Take 1 tablet (500 mg total) by mouth 2 (two) times daily. 07/16/23  Yes Particia Nearing, PA-C  amLODipine (NORVASC) 5 MG tablet TAKE 1 TABLET (5 MG TOTAL) BY MOUTH DAILY. 03/17/23   Adline Potter, NP  diclofenac (VOLTAREN) 75 MG EC tablet Take 1 tablet (75 mg total) by mouth 2 (two) times daily as needed. 03/04/23   Cristie Hem, PA-C  diclofenac Sodium (VOLTAREN) 1 %  GEL Apply topically 4 (four) times daily.    [provider]  folic acid (FOLVITE) 1 MG tablet TAKE 2 TABLETS BY MOUTH EVERY DAY 06/14/22   Gearldine Bienenstock, PA-C  levonorgestrel (MIRENA) 20 MCG/24HR IUD 1 each by Intrauterine route once.    [provider]  MAGNESIUM PO Take by mouth.    [provider]  meclizine (ANTIVERT) 25 MG tablet Take 1 tablet (25 mg total) by mouth 3 (three) times daily as needed for dizziness. 03/22/23   Adline Potter, NP  methotrexate (RHEUMATREX) 2.5 MG tablet TAKE 7 TABLETS (17.5 MG TOTAL) BY MOUTH ONCE A WEEK. CAUTION:CHEMOTHERAPY. PROTECT FROM LIGHT. 04/28/23   Gearldine Bienenstock, PA-C  PHENTERMINE HCL PO Take by mouth.    [provider]    Family History Family History  Problem Relation Age of Onset   Diabetes Mother    COPD Mother    Arthritis  Mother    Hypertension Mother    Heart disease Mother    Congestive Heart Failure Mother    Hearing loss Father    Asthma Son    Cancer Maternal Grandmother        lung   Heart disease Paternal Grandmother        CHF    Social History Social History   Tobacco Use   Smoking status: Never    Passive exposure: Current   Smokeless tobacco: Never  Vaping Use   Vaping status: Never Used  Substance Use Topics   Alcohol use: Yes    Comment: 1 or 2 times per year   Drug use: Never     Allergies   Contrast media [iodinated contrast media]   Review of Systems Review of Systems Per HPI  Physical Exam Triage Vital Signs ED Triage Vitals  Encounter Vitals Group     BP 07/16/23 1319 137/87     Systolic BP Percentile --      Diastolic BP Percentile --      Pulse Rate 07/16/23 1319 94     Resp 07/16/23 1319 20     Temp 07/16/23 1319 98.1 F (36.7 C)     Temp Source 07/16/23 1319 Oral     SpO2 07/16/23 1319 98 %     Weight --      Height --      Head Circumference --      Peak Flow --      Pain Score 07/16/23 1318 0     Pain Loc --      Pain Education --      Exclude from Growth Chart --    No data found.  Updated Vital Signs BP 137/87 (BP Location: Right Arm)   Pulse 94   Temp 98.1 F (36.7 C) (Oral)   Resp 20   SpO2 98%   Visual Acuity Right Eye Distance:   Left Eye Distance:   Bilateral Distance:    Right Eye Near:   Left Eye Near:    Bilateral Near:     Physical Exam Vitals and nursing note reviewed.  Constitutional:      Appearance: Normal appearance. She is not ill-appearing.  HENT:     Head: Atraumatic.     Mouth/Throat:     Mouth: Mucous membranes are moist.  Eyes:     Extraocular Movements: Extraocular movements intact.     Conjunctiva/sclera: Conjunctivae normal.  Cardiovascular:     Rate and Rhythm: Normal rate and regular rhythm.  Heart sounds: Normal heart sounds.  Pulmonary:     Effort: Pulmonary effort is normal.     Breath  sounds: Normal breath sounds.  Genitourinary:    Comments: GU exam deferred, self swab performed Musculoskeletal:        General: Normal range of motion.     Cervical back: Normal range of motion and neck supple.  Skin:    General: Skin is warm and dry.  Neurological:     Mental Status: She is alert and oriented to person, place, and time.     Motor: No weakness.     Gait: Gait normal.  Psychiatric:        Mood and Affect: Mood normal.        Thought Content: Thought content normal.        Judgment: Judgment normal.      UC Treatments / Results  Labs (all labs ordered are listed, but only abnormal results are displayed) Labs Reviewed  CERVICOVAGINAL ANCILLARY ONLY    EKG   Radiology No results found.  Procedures Procedures (including critical care time)  Medications Ordered in UC Medications - No data to display  Initial Impression / Assessment and Plan / UC Course  I have reviewed the triage vital signs and the nursing notes.  Pertinent labs & imaging results that were available during my care of the patient were reviewed by me and considered in my medical decision making (see chart for details).     Suspect BV, treat with metronidazole while awaiting results and adjust if needed.  Return for worsening symptoms.  Final Clinical Impressions(s) / UC Diagnoses   Final diagnoses:  Acute vaginitis   Discharge Instructions   None    ED Prescriptions     Medication Sig Dispense Auth. Provider   metroNIDAZOLE (FLAGYL) 500 MG tablet Take 1 tablet (500 mg total) by mouth 2 (two) times daily. 14 tablet Particia Nearing, [place]      PDMP not reviewed this encounter.   Particia Nearing, [place] 07/16/23 1351

## 2023-07-16 NOTE — ED Triage Notes (Signed)
Pt reports vaginal discharge for last several days. Pt reports recently changed partners and would like to be checked. Denied wanting bloodwork but would like swab to be collected. Denies any changes in self care products. Last unprotected intercourse on July 27th.

## 2023-07-20 LAB — CERVICOVAGINAL ANCILLARY ONLY
Bacterial Vaginitis (gardnerella): POSITIVE — AB
Candida Glabrata: NEGATIVE
Candida Vaginitis: NEGATIVE
Chlamydia: NEGATIVE
Comment: NEGATIVE
Comment: NEGATIVE
Comment: NEGATIVE
Comment: NEGATIVE
Comment: NEGATIVE
Comment: NORMAL
Neisseria Gonorrhea: NEGATIVE
Trichomonas: NEGATIVE

## 2023-07-26 ENCOUNTER — Encounter: Payer: Self-pay | Admitting: General Surgery

## 2023-07-26 ENCOUNTER — Ambulatory Visit: Payer: No Typology Code available for payment source | Admitting: General Surgery

## 2023-07-26 VITALS — BP 131/84 | HR 93 | Temp 98.3°F | Resp 14 | Ht 62.0 in | Wt 214.0 lb

## 2023-07-26 DIAGNOSIS — D171 Benign lipomatous neoplasm of skin and subcutaneous tissue of trunk: Secondary | ICD-10-CM

## 2023-07-27 NOTE — Progress Notes (Signed)
[name]; [id number]; [birth year]   HPI Patient is a 52 year old black female who was referred to my care by Cyril Mourning for evaluation treatment of a keloid on her abdomen and a lipoma in the left upper back.  She states the keloid has been enlarging over the past few years.  It does get irritated at times.  She is status post a laparoscopic cholecystectomy in the remote past.  She has a lipoma posteriorly on her back around the left scapula. Past Medical History:  Diagnosis Date   Arthritis    BV (bacterial vaginosis)    Constipation 06/16/2016   HSV (herpes simplex virus) infection    Hypertension    IUD (intrauterine device) in place 03/20/2014   IUD inserted 01/23/13   Obesity    Osteoarthritis    RA (rheumatoid arthritis) (HCC)    Trichomonas    Twitching     Past Surgical History:  Procedure Laterality Date   APPENDECTOMY     CHOLECYSTECTOMY     HAND SURGERY     MOUTH SURGERY  03/22/2023   tooth removed   OVARIAN CYST REMOVAL N/A 08/17/2007   SHOULDER ARTHROSCOPY  06/24/2022   TONSILLECTOMY      Family History  Problem Relation Age of Onset   Diabetes Mother    COPD Mother    Arthritis Mother    Hypertension Mother    Heart disease Mother    Congestive Heart Failure Mother    Hearing loss Father    Asthma Son    Cancer Maternal Grandmother        lung   Heart disease Paternal Grandmother        CHF    Current Outpatient Medications on File Prior to Visit  Medication Sig Dispense Refill   amLODipine (NORVASC) 5 MG tablet TAKE 1 TABLET (5 MG TOTAL) BY MOUTH DAILY. 90 tablet 1   diclofenac (VOLTAREN) 75 MG EC tablet Take 1 tablet (75 mg total) by mouth 2 (two) times daily as needed. 60 tablet 2   diclofenac Sodium (VOLTAREN) 1 % GEL Apply topically 4 (four) times daily.     folic acid (FOLVITE) 1 MG tablet TAKE 2 TABLETS BY MOUTH EVERY DAY 180 tablet 3   levonorgestrel (MIRENA) 20 MCG/24HR IUD 1 each by Intrauterine route once.     MAGNESIUM PO  Take by mouth.     meclizine (ANTIVERT) 25 MG tablet Take 1 tablet (25 mg total) by mouth 3 (three) times daily as needed for dizziness. 30 tablet 1   methotrexate (RHEUMATREX) 2.5 MG tablet TAKE 7 TABLETS (17.5 MG TOTAL) BY MOUTH ONCE A WEEK. CAUTION:CHEMOTHERAPY. PROTECT FROM LIGHT. 28 tablet 0   metroNIDAZOLE (FLAGYL) 500 MG tablet Take 1 tablet (500 mg total) by mouth 2 (two) times daily. 14 tablet 0   PHENTERMINE HCL PO Take by mouth.     No current facility-administered medications on file prior to visit.    Allergies  Allergen Reactions   Contrast Media [Iodinated Contrast Media] Swelling    Swelling of the eyes    Social History   Substance and Sexual Activity  Alcohol Use Yes   Comment: 1 or 2 times per year    Social History   Tobacco Use  Smoking Status Never   Passive exposure: Current  Smokeless Tobacco Never    Review of Systems  Constitutional: Negative.   HENT: Negative.    Eyes: Negative.   Respiratory: Negative.    Cardiovascular: Negative.   Gastrointestinal:  Negative.   Genitourinary: Negative.   Musculoskeletal: Negative.   Skin: Negative.   Neurological: Negative.   Endo/Heme/Allergies: Negative.   Psychiatric/Behavioral: Negative.      Objective   Vitals:   07/26/23 1504  BP: 131/84  Pulse: 93  Resp: 14  Temp: 98.3 F (36.8 C)  SpO2: 94%    Physical Exam Vitals reviewed.  Constitutional:      Appearance: Normal appearance. She is obese. She is not ill-appearing.  HENT:     Head: Normocephalic and atraumatic.  Cardiovascular:     Rate and Rhythm: Normal rate and regular rhythm.     Heart sounds: Normal heart sounds. No murmur heard.    No friction rub. No gallop.  Pulmonary:     Effort: Pulmonary effort is normal. No respiratory distress.     Breath sounds: Normal breath sounds. No stridor. No wheezing, rhonchi or rales.  Musculoskeletal:     Comments: A 5 cm ovoid subcutaneous rubbery mass is noted over the left scapula  posteriorly.  Skin:    General: Skin is warm and dry.     Comments: A keloid is noted in the right upper abdomen at the surgical trocar site.  Neurological:     Mental Status: She is alert and oriented to person, place, and time.     Assessment  Keloid, abdominal wall, surgical scar Lipoma, 5 cm, left upper back Plan  Patient will call in December to schedule excision of the keloid on the abdominal wall and excision of the lipoma of her left upper back.  The risks and benefits of both procedures including bleeding, infection, and recurrence were fully explained to the patient, who gave informed consent.

## 2023-09-22 ENCOUNTER — Ambulatory Visit: Payer: No Typology Code available for payment source | Admitting: Adult Health

## 2023-09-24 ENCOUNTER — Other Ambulatory Visit: Payer: Self-pay | Admitting: Adult Health

## 2024-05-19 ENCOUNTER — Other Ambulatory Visit: Payer: Self-pay | Admitting: Adult Health

## 2024-08-30 ENCOUNTER — Other Ambulatory Visit: Payer: Self-pay

## 2024-08-30 ENCOUNTER — Encounter: Payer: Self-pay | Admitting: General Surgery

## 2024-08-30 ENCOUNTER — Ambulatory Visit: Admitting: General Surgery

## 2024-08-30 VITALS — BP 135/88 | HR 90 | Temp 98.4°F | Resp 16 | Ht 62.0 in | Wt 209.0 lb

## 2024-08-30 DIAGNOSIS — Z1211 Encounter for screening for malignant neoplasm of colon: Secondary | ICD-10-CM | POA: Diagnosis not present

## 2024-08-30 MED ORDER — SUTAB 1479-225-188 MG PO TABS: 24.0000 | ORAL_TABLET | Freq: Once | ORAL | 0 refills | Status: AC

## 2024-08-31 NOTE — Progress Notes (Signed)
 [name]; [id number]; 1971-02-13   HPI Patient is a 53 year old black female who is referred to my care by Dr. Ozan for a screening colonoscopy.  Patient has never had a colonoscopy.  She denies any family history of colon cancer.  She denies any abnormal diarrhea, constipation, blood in her stools, or weight change. Past Medical History:  Diagnosis Date   Arthritis    BV (bacterial vaginosis)    Constipation 06/16/2016   HSV (herpes simplex virus) infection    Hypertension    IUD (intrauterine device) in place 03/20/2014   IUD inserted 01/23/13   Obesity    Osteoarthritis    RA (rheumatoid arthritis) (HCC)    Trichomonas    Twitching     Past Surgical History:  Procedure Laterality Date   APPENDECTOMY     CHOLECYSTECTOMY     HAND SURGERY     MOUTH SURGERY  03/22/2023   tooth removed   OVARIAN CYST REMOVAL N/A 08/17/2007   SHOULDER ARTHROSCOPY  06/24/2022   TONSILLECTOMY      Family History  Problem Relation Age of Onset   Diabetes Mother    COPD Mother    Arthritis Mother    Hypertension Mother    Heart disease Mother    Congestive Heart Failure Mother    Hearing loss Father    Asthma Son    Cancer Maternal Grandmother        lung   Heart disease Paternal Grandmother        CHF    Current Outpatient Medications on File Prior to Visit  Medication Sig Dispense Refill   amLODipine  (NORVASC ) 5 MG tablet TAKE 1 TABLET (5 MG TOTAL) BY MOUTH DAILY. 90 tablet 1   diclofenac  (VOLTAREN ) 75 MG EC tablet Take 1 tablet (75 mg total) by mouth 2 (two) times daily as needed. 60 tablet 2   folic acid  (FOLVITE ) 1 MG tablet TAKE 2 TABLETS BY MOUTH EVERY DAY 180 tablet 3   levonorgestrel  (MIRENA ) 20 MCG/24HR IUD 1 each by Intrauterine route once.     MAGNESIUM  PO Take by mouth.     meclizine  (ANTIVERT ) 25 MG tablet Take 1 tablet (25 mg total) by mouth 3 (three) times daily as needed for dizziness. 30 tablet 1   methotrexate  (RHEUMATREX) 2.5 MG tablet TAKE 7 TABLETS (17.5  MG TOTAL) BY MOUTH ONCE A WEEK. CAUTION:CHEMOTHERAPY. PROTECT FROM LIGHT. 28 tablet 0   WEGOVY 0.25 MG/0.5ML SOAJ SQ injection Inject 0.25 mg into the skin once a week.     No current facility-administered medications on file prior to visit.    Allergies  Allergen Reactions   Contrast Media [Iodinated Contrast Media] Swelling    Swelling of the eyes    Social History   Substance and Sexual Activity  Alcohol Use Yes   Comment: 1 or 2 times per year    Social History   Tobacco Use  Smoking Status Never   Passive exposure: Current  Smokeless Tobacco Never    Review of Systems  Constitutional: Negative.   HENT:  Positive for sinus pain.   Eyes: Negative.   Respiratory: Negative.    Cardiovascular: Negative.   Gastrointestinal: Negative.   Genitourinary: Negative.   Musculoskeletal: Negative.   Skin: Negative.   Neurological: Negative.   Endo/Heme/Allergies: Negative.   Psychiatric/Behavioral: Negative.      Objective   Vitals:   08/30/24 0948  BP: 135/88  Pulse: 90  Resp: 16  Temp: 98.4 F (36.9 C)  SpO2: 95%    Physical Exam Vitals reviewed.  Constitutional:      Appearance: Normal appearance. She is not ill-appearing.  HENT:     Head: Normocephalic and atraumatic.  Cardiovascular:     Rate and Rhythm: Normal rate and regular rhythm.     Heart sounds: Normal heart sounds. No murmur heard.    No friction rub. No gallop.  Pulmonary:     Effort: Pulmonary effort is normal. No respiratory distress.     Breath sounds: Normal breath sounds. No stridor. No wheezing, rhonchi or rales.  Abdominal:     General: Bowel sounds are normal. There is no distension.     Palpations: Abdomen is soft. There is no mass.     Tenderness: There is no abdominal tenderness. There is no guarding or rebound.     Hernia: No hernia is present.  Skin:    General: Skin is warm and dry.  Neurological:     Mental Status: She is alert and oriented to person, place, and time.      Assessment  Need for screening colonoscopy Plan  Patient is scheduled for screening colonoscopy on 10/23/2024.  The risks and benefits of the procedure including bleeding and perforation were fully explained to the patient, who gave informed consent.  Sutabs have been prescribed preoperatively for bowel preparation.  Patient has been told she needs to stop her Wegovy 1 week prior to procedure.

## 2024-09-09 ENCOUNTER — Ambulatory Visit
Admission: EM | Admit: 2024-09-09 | Discharge: 2024-09-09 | Disposition: A | Discharge status: Home / Self Care | Attending: Family Medicine | Admitting: Family Medicine

## 2024-09-09 DIAGNOSIS — J069 Acute upper respiratory infection, unspecified: Secondary | ICD-10-CM

## 2024-09-09 LAB — POC COVID19/FLU A&B COMBO
Covid Antigen, POC: NEGATIVE
Influenza A Antigen, POC: NEGATIVE
Influenza B Antigen, POC: NEGATIVE

## 2024-09-09 LAB — POCT RAPID STREP A (OFFICE): Rapid Strep A Screen: NEGATIVE

## 2024-09-09 MED ORDER — AZELASTINE HCL 0.1 % NA SOLN: 1.0000 | Freq: Two times a day (BID) | NASAL | 0 refills | Status: AC

## 2024-09-09 MED ORDER — PROMETHAZINE-DM 6.25-15 MG/5ML PO SYRP
5.0000 mL | ORAL_SOLUTION | Freq: Four times a day (QID) | ORAL | 0 refills | Status: AC | PRN
Start: 2024-09-09 — End: ?

## 2024-09-09 NOTE — ED Triage Notes (Signed)
 Pt being seen in UC for sore throat, bilateral ear pain, and cough that began Thursday. Pt reports fever on Thursday night, none since then. Pt reports taking otc medication with no relief.

## 2024-09-09 NOTE — ED Provider Notes (Signed)
 [facility]    CSN: [id number] Arrival date & time: 09/09/24  0906      History   Chief Complaint Chief Complaint  Patient presents with   Sore Throat   Cough   Otalgia    HPI [name] is a 53 y.o. female.   Pt being seen in UC for sore throat, bilateral ear pain, and cough that began Thursday. Pt reports fever on Thursday night, none since then. Pt reports taking otc medication with no relief.       Past Medical History:  Diagnosis Date   Arthritis    BV (bacterial vaginosis)    Constipation 06/16/2016   HSV (herpes simplex virus) infection    Hypertension    IUD (intrauterine device) in place 03/20/2014   IUD inserted 01/23/13   Obesity    Osteoarthritis    RA (rheumatoid arthritis) (HCC)    Trichomonas    Twitching     Patient Active Problem List   Diagnosis Date Noted   Keloid 06/22/2023   Lipoma of back 06/22/2023   Yeast infection 11/04/2022   Vaginal discharge 11/04/2022   Vaginal itching 11/04/2022   Hypertension 10/25/2022   IUD (intrauterine device) in place 10/25/2022   Pelvic pressure in female 10/25/2022   Hematuria 10/25/2022   Screen for STD (sexually transmitted disease) 10/25/2022   Routine cervical smear 10/25/2022   S/P arthroscopy of right shoulder 08/04/2022   Partial nontraumatic tear of right rotator cuff 05/26/2022   Arthritis of right acromioclavicular joint 05/26/2022   Chronic migraine w/o aura w/o status migrainosus, not intractable 11/13/2020   Fasciculation 11/13/2020   Multiple thyroid  nodules 12/13/2018   Hair loss 12/13/2018   Encounter for IUD insertion 01/26/2018   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   Multiple acquired skin tags 07/14/2017   Rheumatoid arthritis with rheumatoid factor of multiple sites without organ or systems involvement (HCC) 11/09/2016   High risk medication use 11/09/2016   Non compliance w medication regimen 11/09/2016   Constipation 06/16/2016   Obesity 05/16/2015     Past Surgical History:  Procedure Laterality Date   APPENDECTOMY     CHOLECYSTECTOMY     HAND SURGERY     MOUTH SURGERY  03/22/2023   tooth removed   OVARIAN CYST REMOVAL N/A 08/17/2007   SHOULDER ARTHROSCOPY  06/24/2022   TONSILLECTOMY      OB History     Gravida  2   Para  1   Term  1   Preterm      AB  1   Living  1      SAB      IAB  1   Ectopic      Multiple      Live Births  1            Home Medications    Prior to Admission medications   Medication Sig Start Date End Date Taking? Authorizing Provider  azelastine (ASTELIN) 0.1 % nasal spray Place 1 spray into both nostrils 2 (two) times daily. Use in each nostril as directed 09/09/24  Yes Stuart Vernell Norris, PA-C  promethazine -dextromethorphan (PROMETHAZINE -DM) 6.25-15 MG/5ML syrup Take 5 mLs by mouth 4 (four) times daily as needed. 09/09/24  Yes Stuart Vernell Norris, PA-C  amLODipine  (NORVASC ) 5 MG tablet TAKE 1 TABLET (5 MG TOTAL) BY MOUTH DAILY. 05/21/24   Signa Delon LABOR, NP  diclofenac  (VOLTAREN ) 75 MG EC tablet Take 1 tablet (75 mg total) by mouth 2 (two)  times daily as needed. 03/04/23   Jule Ronal CROME, PA-C  folic acid  (FOLVITE ) 1 MG tablet TAKE 2 TABLETS BY MOUTH EVERY DAY 06/14/22   Cheryl Waddell HERO, PA-C  levonorgestrel  (MIRENA ) 20 MCG/24HR IUD 1 each by Intrauterine route once.    [provider]  MAGNESIUM  PO Take by mouth.    [provider]  meclizine  (ANTIVERT ) 25 MG tablet Take 1 tablet (25 mg total) by mouth 3 (three) times daily as needed for dizziness. 03/22/23   Signa Delon LABOR, NP  methotrexate  (RHEUMATREX) 2.5 MG tablet TAKE 7 TABLETS (17.5 MG TOTAL) BY MOUTH ONCE A WEEK. CAUTION:CHEMOTHERAPY. PROTECT FROM LIGHT. 04/28/23   Cheryl Waddell HERO, PA-C  WEGOVY 0.25 MG/0.5ML SOAJ SQ injection Inject 0.25 mg into the skin once a week. 07/27/24   [provider]    Family History Family History  Problem Relation Age of Onset   Diabetes Mother     COPD Mother    Arthritis Mother    Hypertension Mother    Heart disease Mother    Congestive Heart Failure Mother    Hearing loss Father    Asthma Son    Cancer Maternal Grandmother        lung   Heart disease Paternal Grandmother        CHF    Social History Social History   Tobacco Use   Smoking status: Never    Passive exposure: Current   Smokeless tobacco: Never  Vaping Use   Vaping status: Never Used  Substance Use Topics   Alcohol use: Yes    Comment: 1 or 2 times per year   Drug use: Never     Allergies   Contrast media [iodinated contrast media]   Review of Systems Review of Systems PER HPI  Physical Exam Triage Vital Signs ED Triage Vitals [09/09/24 0919]  Encounter Vitals Group     BP 134/89     Girls Systolic BP Percentile      Girls Diastolic BP Percentile      Boys Systolic BP Percentile      Boys Diastolic BP Percentile      Pulse Rate 88     Resp 18     Temp 98.7 F (37.1 C)     Temp Source Oral     SpO2 96 %     Weight      Height      Head Circumference      Peak Flow      Pain Score 9     Pain Loc      Pain Education      Exclude from Growth Chart    No data found.  Updated Vital Signs BP 134/89 (BP Location: Right Arm)   Pulse 88   Temp 98.7 F (37.1 C) (Oral)   Resp 18   SpO2 96%   Visual Acuity Right Eye Distance:   Left Eye Distance:   Bilateral Distance:    Right Eye Near:   Left Eye Near:    Bilateral Near:     Physical Exam Vitals and nursing note reviewed.  Constitutional:      Appearance: Normal appearance.  HENT:     Head: Atraumatic.     Right Ear: Tympanic membrane and external ear normal.     Left Ear: External ear normal.     Ears:     Comments: Left middle ear effusion    Nose: Rhinorrhea present.     Mouth/Throat:  Mouth: Mucous membranes are moist.     Pharynx: Posterior oropharyngeal erythema present.  Eyes:     Extraocular Movements: Extraocular movements intact.      Conjunctiva/sclera: Conjunctivae normal.  Cardiovascular:     Rate and Rhythm: Normal rate and regular rhythm.     Heart sounds: Normal heart sounds.  Pulmonary:     Effort: Pulmonary effort is normal.     Breath sounds: Normal breath sounds. No wheezing.  Musculoskeletal:        General: Normal range of motion.     Cervical back: Normal range of motion and neck supple.  Skin:    General: Skin is warm and dry.  Neurological:     Mental Status: She is alert and oriented to person, place, and time.  Psychiatric:        Mood and Affect: Mood normal.        Thought Content: Thought content normal.      UC Treatments / Results  Labs (all labs ordered are listed, but only abnormal results are displayed) Labs Reviewed  POCT RAPID STREP A (OFFICE)  POC COVID19/FLU A&B COMBO    EKG   Radiology No results found.  Procedures Procedures (including critical care time)  Medications Ordered in UC Medications - No data to display  Initial Impression / Assessment and Plan / UC Course  I have reviewed the triage vital signs and the nursing notes.  Pertinent labs & imaging results that were available during my care of the patient were reviewed by me and considered in my medical decision making (see chart for details).     Vitals and exam reassuring, rapid strep flu and COVID all negative.  Will treat with Astelin, Phenergan  DM, supportive over-the-counter medications and home care.  Return for worsening symptoms.  Final Clinical Impressions(s) / UC Diagnoses   Final diagnoses:  Viral URI with cough   Discharge Instructions   None    ED Prescriptions     Medication Sig Dispense Auth. Provider   azelastine (ASTELIN) 0.1 % nasal spray Place 1 spray into both nostrils 2 (two) times daily. Use in each nostril as directed 30 mL Stuart Vernell Norris, PA-C   promethazine -dextromethorphan (PROMETHAZINE -DM) 6.25-15 MG/5ML syrup Take 5 mLs by mouth 4 (four) times daily as needed.  100 mL Stuart Vernell Norris, [place]      PDMP not reviewed this encounter.   Stuart Vernell Norris, [place] 09/09/24 1001

## 2024-10-05 NOTE — H&P (Signed)
 [name]; [id number]; 1971-02-13   HPI Patient is a 53 year old black female who is referred to my care by Dr. Ozan for a screening colonoscopy.  Patient has never had a colonoscopy.  She denies any family history of colon cancer.  She denies any abnormal diarrhea, constipation, blood in her stools, or weight change. Past Medical History:  Diagnosis Date   Arthritis    BV (bacterial vaginosis)    Constipation 06/16/2016   HSV (herpes simplex virus) infection    Hypertension    IUD (intrauterine device) in place 03/20/2014   IUD inserted 01/23/13   Obesity    Osteoarthritis    RA (rheumatoid arthritis) (HCC)    Trichomonas    Twitching     Past Surgical History:  Procedure Laterality Date   APPENDECTOMY     CHOLECYSTECTOMY     HAND SURGERY     MOUTH SURGERY  03/22/2023   tooth removed   OVARIAN CYST REMOVAL N/A 08/17/2007   SHOULDER ARTHROSCOPY  06/24/2022   TONSILLECTOMY      Family History  Problem Relation Age of Onset   Diabetes Mother    COPD Mother    Arthritis Mother    Hypertension Mother    Heart disease Mother    Congestive Heart Failure Mother    Hearing loss Father    Asthma Son    Cancer Maternal Grandmother        lung   Heart disease Paternal Grandmother        CHF    Current Outpatient Medications on File Prior to Visit  Medication Sig Dispense Refill   amLODipine  (NORVASC ) 5 MG tablet TAKE 1 TABLET (5 MG TOTAL) BY MOUTH DAILY. 90 tablet 1   diclofenac  (VOLTAREN ) 75 MG EC tablet Take 1 tablet (75 mg total) by mouth 2 (two) times daily as needed. 60 tablet 2   folic acid  (FOLVITE ) 1 MG tablet TAKE 2 TABLETS BY MOUTH EVERY DAY 180 tablet 3   levonorgestrel  (MIRENA ) 20 MCG/24HR IUD 1 each by Intrauterine route once.     MAGNESIUM  PO Take by mouth.     meclizine  (ANTIVERT ) 25 MG tablet Take 1 tablet (25 mg total) by mouth 3 (three) times daily as needed for dizziness. 30 tablet 1   methotrexate  (RHEUMATREX) 2.5 MG tablet TAKE 7 TABLETS (17.5  MG TOTAL) BY MOUTH ONCE A WEEK. CAUTION:CHEMOTHERAPY. PROTECT FROM LIGHT. 28 tablet 0   WEGOVY 0.25 MG/0.5ML SOAJ SQ injection Inject 0.25 mg into the skin once a week.     No current facility-administered medications on file prior to visit.    Allergies  Allergen Reactions   Contrast Media [Iodinated Contrast Media] Swelling    Swelling of the eyes    Social History   Substance and Sexual Activity  Alcohol Use Yes   Comment: 1 or 2 times per year    Social History   Tobacco Use  Smoking Status Never   Passive exposure: Current  Smokeless Tobacco Never    Review of Systems  Constitutional: Negative.   HENT:  Positive for sinus pain.   Eyes: Negative.   Respiratory: Negative.    Cardiovascular: Negative.   Gastrointestinal: Negative.   Genitourinary: Negative.   Musculoskeletal: Negative.   Skin: Negative.   Neurological: Negative.   Endo/Heme/Allergies: Negative.   Psychiatric/Behavioral: Negative.      Objective   Vitals:   08/30/24 0948  BP: 135/88  Pulse: 90  Resp: 16  Temp: 98.4 F (36.9 C)  SpO2: 95%    Physical Exam Vitals reviewed.  Constitutional:      Appearance: Normal appearance. She is not ill-appearing.  HENT:     Head: Normocephalic and atraumatic.  Cardiovascular:     Rate and Rhythm: Normal rate and regular rhythm.     Heart sounds: Normal heart sounds. No murmur heard.    No friction rub. No gallop.  Pulmonary:     Effort: Pulmonary effort is normal. No respiratory distress.     Breath sounds: Normal breath sounds. No stridor. No wheezing, rhonchi or rales.  Abdominal:     General: Bowel sounds are normal. There is no distension.     Palpations: Abdomen is soft. There is no mass.     Tenderness: There is no abdominal tenderness. There is no guarding or rebound.     Hernia: No hernia is present.  Skin:    General: Skin is warm and dry.  Neurological:     Mental Status: She is alert and oriented to person, place, and time.      Assessment  Need for screening colonoscopy Plan  Patient is scheduled for screening colonoscopy on 10/23/2024.  The risks and benefits of the procedure including bleeding and perforation were fully explained to the patient, who gave informed consent.  Sutabs have been prescribed preoperatively for bowel preparation.  Patient has been told she needs to stop her Wegovy 1 week prior to procedure.

## 2024-10-23 ENCOUNTER — Encounter (HOSPITAL_COMMUNITY): Payer: Self-pay | Admitting: General Surgery

## 2024-10-23 ENCOUNTER — Encounter (HOSPITAL_COMMUNITY)
Admission: RE | Disposition: A | Discharge status: Home / Self Care | Payer: Self-pay | Source: Home / Self Care | Attending: General Surgery

## 2024-10-23 ENCOUNTER — Other Ambulatory Visit: Payer: Self-pay

## 2024-10-23 ENCOUNTER — Ambulatory Visit (HOSPITAL_COMMUNITY): Admitting: Certified Registered"

## 2024-10-23 ENCOUNTER — Ambulatory Visit (HOSPITAL_COMMUNITY)
Admission: RE | Admit: 2024-10-23 | Discharge: 2024-10-23 | Disposition: A | Discharge status: Home / Self Care | Attending: General Surgery | Admitting: General Surgery

## 2024-10-23 DIAGNOSIS — Z1211 Encounter for screening for malignant neoplasm of colon: Secondary | ICD-10-CM

## 2024-10-23 HISTORY — PX: COLONOSCOPY: SHX5424

## 2024-10-23 SURGERY — COLONOSCOPY
Anesthesia: General

## 2024-10-23 MED ORDER — LACTATED RINGERS IV SOLN: INTRAVENOUS | Status: DC

## 2024-10-23 MED ORDER — PROPOFOL 10 MG/ML IV BOLUS
INTRAVENOUS | Status: DC | PRN
  Administered 2024-10-23: 175 ug/kg/min via INTRAVENOUS
  Administered 2024-10-23: 100 mg via INTRAVENOUS

## 2024-10-23 MED ORDER — LACTATED RINGERS IV SOLN: INTRAVENOUS | Status: DC | PRN

## 2024-10-23 MED ORDER — LIDOCAINE HCL (CARDIAC) PF 100 MG/5ML IV SOSY
PREFILLED_SYRINGE | INTRAVENOUS | Status: DC | PRN
  Administered 2024-10-23: 100 mg via INTRAVENOUS

## 2024-10-23 NOTE — Transfer of Care (Signed)
 Immediate Anesthesia Transfer of Care Note  Patient: [name]  Procedure(s) Performed: COLONOSCOPY  Patient Location: Endoscopy Unit  Anesthesia Type:General  Level of Consciousness: drowsy, patient cooperative, and responds to stimulation  Airway & Oxygen Therapy: Patient Spontanous Breathing  Post-op Assessment: Report given to RN and Post -op Vital signs reviewed and stable  Post vital signs: Reviewed and stable  Last Vitals:  Vitals Value Taken Time  BP 118/69 10/23/24 07:37  Temp 36.9 C 10/23/24 07:37  Pulse 86 10/23/24 07:37  Resp 12 10/23/24 07:37  SpO2 96 % 10/23/24 07:37    Last Pain:  Vitals:   10/23/24 0737  TempSrc: Oral  PainSc:       Patients Stated Pain Goal: 7 (10/23/24 0657)  Complications: No notable events documented.

## 2024-10-23 NOTE — Interval H&P Note (Signed)
 History and Physical Interval Note:  10/23/2024 7:16 AM  [name]  has presented today for surgery, with the diagnosis of SCREENING FOR COLON CANCER.  The various methods of treatment have been discussed with the patient and family. After consideration of risks, benefits and other options for treatment, the patient has consented to  Procedure(s) with comments: COLONOSCOPY (N/A) - W/ PROPOFOL  as a surgical intervention.  The patient's history has been reviewed, patient examined, no change in status, stable for surgery.  I have reviewed the patient's chart and labs.  Questions were answered to the patient's satisfaction.     Oneil Budge

## 2024-10-23 NOTE — Op Note (Signed)
 [facility] Patient Name: [name] Procedure Date: 10/23/2024 7:03 AM MRN: [id number] Date of Birth: 1971/04/09 Attending MD: Oneil Budge , MD, [id number] CSN: [id number] Age: 53 Admit Type: Outpatient Procedure:                Colonoscopy Indications:              Screening for colorectal malignant neoplasm Providers:                Oneil Budge, MD, Harlene Lips, Daphne Mulch                            Technician, Technician Referring MD:              Medicines:                Propofol  per Anesthesia Complications:            No immediate complications. Estimated Blood Loss:     Estimated blood loss: none. Procedure:                Pre-Anesthesia Assessment:                           - Prior to the procedure, a History and Physical                            was performed, and patient medications and                            allergies were reviewed. The patient is competent.                            The risks and benefits of the procedure and the                            sedation options and risks were discussed with the                            patient. All questions were answered and informed                            consent was obtained. Patient identification and                            proposed procedure were verified by the physician,                            the nurse, the anesthetist and the technician in                            the procedure room. Mental Status Examination:                            alert and oriented. Airway Examination: normal  oropharyngeal airway and neck mobility. Respiratory                            Examination: clear to auscultation. CV Examination:                            RRR, no murmurs, no S3 or S4. Prophylactic                            Antibiotics: The patient does not require                            prophylactic antibiotics. Prior Anticoagulants: The                             patient has taken no anticoagulant or antiplatelet                            agents. ASA Grade Assessment: II - A patient with                            mild systemic disease. After reviewing the risks                            and benefits, the patient was deemed in                            satisfactory condition to undergo the procedure.                            The anesthesia plan was to use deep sedation /                            analgesia. Immediately prior to administration of                            medications, the patient was re-assessed for                            adequacy to receive sedatives. The heart rate,                            respiratory rate, oxygen saturations, blood                            pressure, adequacy of pulmonary ventilation, and                            response to care were monitored throughout the                            procedure. The physical status of the patient was  re-assessed after the procedure.                           After obtaining informed consent, the colonoscope                            was passed under direct vision. Throughout the                            procedure, the patient's blood pressure, pulse, and                            oxygen saturations were monitored continuously. The                            [id number] ([id number]) Colon was introduced through                            the anus and advanced to the the cecum, identified                            by the appendiceal orifice, ileocecal valve and                            palpation. No anatomical landmarks were                            photographed. The entire colon was well visualized.                            The colonoscopy was performed without difficulty.                            The patient tolerated the procedure well. The                            quality of the bowel preparation was adequate. The                             total duration of the procedure was 10 minutes. Scope In: 7:22:40 AM Scope Out: 7:32:25 AM Scope Withdrawal Time: 0 hours 3 minutes 42 seconds  Total Procedure Duration: 0 hours 9 minutes 45 seconds  Findings:      The perianal and digital rectal examinations were normal.      The entire examined colon appeared normal on direct and retroflexion       views. Impression:               - The entire examined colon is normal on direct and                            retroflexion views.                           - No specimens collected. Moderate Sedation:      Moderate (  conscious) sedation was administered by the nurse and       supervised by the endoscopist. The patient's oxygen saturation, heart       rate, blood pressure and response to care were monitored. Recommendation:           - Written discharge instructions were provided to                            the patient.                           - The signs and symptoms of potential delayed                            complications were discussed with the patient.                           - Patient has a contact number available for                            emergencies.                           - Return to normal activities tomorrow.                           - Resume previous diet.                           - Continue present medications.                           - Repeat colonoscopy in 10 years for screening                            purposes. Procedure Code(s):        --- Professional ---                           [phone], Colonoscopy, flexible; diagnostic, including                            collection of specimen(s) by brushing or washing,                            when performed (separate procedure) Diagnosis Code(s):        --- Professional ---                           Z12.11, Encounter for screening for malignant                            neoplasm of colon CPT copyright 2022 American Medical  Association. All rights reserved. The codes documented in this report are preliminary and upon coder review may  be revised to meet current compliance requirements. Oneil Budge, MD Oneil Budge, MD 10/23/2024 7:39:09 AM This report has been signed electronically. Number of Addenda: 0

## 2024-10-23 NOTE — Anesthesia Postprocedure Evaluation (Signed)
 Anesthesia Post Note  Patient: [name]  Procedure(s) Performed: COLONOSCOPY  Patient location during evaluation: PACU Anesthesia Type: General Level of consciousness: awake and alert Pain management: pain level controlled Vital Signs Assessment: post-procedure vital signs reviewed and stable Respiratory status: spontaneous breathing, nonlabored ventilation, respiratory function stable and patient connected to nasal cannula oxygen Cardiovascular status: blood pressure returned to baseline and stable Postop Assessment: no apparent nausea or vomiting Anesthetic complications: no   No notable events documented.   Last Vitals:  Vitals:   10/23/24 0657 10/23/24 0737  BP: (!) 143/81 118/69  Pulse: 82 86  Resp: 17 12  Temp: 36.8 C 36.9 C  SpO2: 96% 96%    Last Pain:  Vitals:   10/23/24 0737  TempSrc: Oral  PainSc:                  Andrea Limes

## 2024-10-23 NOTE — Anesthesia Preprocedure Evaluation (Signed)
 Anesthesia Evaluation  Patient identified by MRN, date of birth, ID band Patient awake    Reviewed: Allergy & Precautions, H&P , NPO status , Patient's Chart, lab work & pertinent test results  Airway Mallampati: II  TM Distance: >3 FB Neck ROM: Full    Dental no notable dental hx.    Pulmonary neg pulmonary ROS   Pulmonary exam normal breath sounds clear to auscultation       Cardiovascular hypertension, Normal cardiovascular exam Rhythm:Regular Rate:Normal     Neuro/Psych  Headaches  negative psych ROS   GI/Hepatic negative GI ROS, Neg liver ROS,,,  Endo/Other  negative endocrine ROS    Renal/GU negative Renal ROS  negative genitourinary   Musculoskeletal  (+) Arthritis , Rheumatoid disorders,    Abdominal   Peds negative pediatric ROS (+)  Hematology negative hematology ROS (+)   Anesthesia Other Findings   Reproductive/Obstetrics negative OB ROS                              Anesthesia Physical Anesthesia Plan  ASA: 2  Anesthesia Plan: General   Post-op Pain Management:    Induction: Intravenous  PONV Risk Score and Plan:   Airway Management Planned: Nasal Cannula  Additional Equipment:   Intra-op Plan:   Post-operative Plan:   Informed Consent: I have reviewed the patients History and Physical, chart, labs and discussed the procedure including the risks, benefits and alternatives for the proposed anesthesia with the patient or authorized representative who has indicated his/her understanding and acceptance.     Dental advisory given  Plan Discussed with: CRNA  Anesthesia Plan Comments:         Anesthesia Quick Evaluation

## 2024-10-24 ENCOUNTER — Encounter (HOSPITAL_COMMUNITY): Payer: Self-pay | Admitting: General Surgery

## 2024-11-16 ENCOUNTER — Ambulatory Visit
Admission: EM | Admit: 2024-11-16 | Discharge: 2024-11-16 | Disposition: A | Discharge status: Home / Self Care | Attending: Family Medicine | Admitting: Family Medicine

## 2024-11-16 DIAGNOSIS — J069 Acute upper respiratory infection, unspecified: Secondary | ICD-10-CM | POA: Diagnosis present

## 2024-11-16 DIAGNOSIS — N760 Acute vaginitis: Secondary | ICD-10-CM | POA: Insufficient documentation

## 2024-11-16 LAB — POC COVID19/FLU A&B COMBO
Covid Antigen, POC: NEGATIVE
Influenza A Antigen, POC: NEGATIVE
Influenza B Antigen, POC: NEGATIVE

## 2024-11-16 MED ORDER — AZELASTINE HCL 0.1 % NA SOLN: 1.0000 | Freq: Two times a day (BID) | NASAL | 0 refills | Status: AC

## 2024-11-16 MED ORDER — PROMETHAZINE-DM 6.25-15 MG/5ML PO SYRP: 5.0000 mL | ORAL_SOLUTION | Freq: Four times a day (QID) | ORAL | 0 refills | Status: AC | PRN

## 2024-11-16 NOTE — Discharge Instructions (Signed)
 We will let you know if anything comes back abnormal on your vaginal swab results.  We will treat based on these.  In the meantime, you may try boric acid vaginal suppositories daily as needed and a female health probiotic.  For your viral upper respiratory symptoms, I have sent in a cough syrup and nasal spray and you may use over-the-counter cold congestion medications as needed.  Return for worsening or unresolving symptoms.

## 2024-11-16 NOTE — ED Provider Notes (Signed)
 " [facility]    CSN: [id number] Arrival date & time: 11/16/24  9180      History   Chief Complaint No chief complaint on file.   HPI [name] is a 53 y.o. female.   Patient presenting today with 2-day history of nasal congestion, cough, body aches, chest congestion.  Denies fever, chills, chest pain, shortness of breath, abdominal pain, vomiting, diarrhea.  So far trying Alka-Seltzer cold and flu with mild temporary benefit.  Son sick with similar symptoms.  Notes she is also having vaginal odor, itching, irritation for several weeks following a colonoscopy.  Denies any rashes or lesions to the area, pelvic pain, abdominal pain, dysuria, hematuria, fevers, chills.  States she is not sexually active and has no concerns for STIs.  Has IUD in place.    Past Medical History:  Diagnosis Date   Arthritis    BV (bacterial vaginosis)    Constipation 06/16/2016   HSV (herpes simplex virus) infection    Hypertension    IUD (intrauterine device) in place 03/20/2014   IUD inserted 01/23/13   Obesity    Osteoarthritis    RA (rheumatoid arthritis) (HCC)    Trichomonas    Twitching     Patient Active Problem List   Diagnosis Date Noted   Special screening for malignant neoplasms, colon 10/23/2024   Keloid 06/22/2023   Lipoma of back 06/22/2023   Yeast infection 11/04/2022   Vaginal discharge 11/04/2022   Vaginal itching 11/04/2022   Hypertension 10/25/2022   IUD (intrauterine device) in place 10/25/2022   Pelvic pressure in female 10/25/2022   Hematuria 10/25/2022   Screen for STD (sexually transmitted disease) 10/25/2022   Routine cervical smear 10/25/2022   S/P arthroscopy of right shoulder 08/04/2022   Partial nontraumatic tear of right rotator cuff 05/26/2022   Arthritis of right acromioclavicular joint 05/26/2022   Chronic migraine w/o aura w/o status migrainosus, not intractable 11/13/2020   Fasciculation 11/13/2020   Multiple thyroid  nodules  12/13/2018   Hair loss 12/13/2018   Encounter for IUD insertion 01/26/2018   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   Multiple acquired skin tags 07/14/2017   Rheumatoid arthritis with rheumatoid factor of multiple sites without organ or systems involvement (HCC) 11/09/2016   High risk medication use 11/09/2016   Non compliance w medication regimen 11/09/2016   Constipation 06/16/2016   Obesity 05/16/2015    Past Surgical History:  Procedure Laterality Date   APPENDECTOMY     CHOLECYSTECTOMY     COLONOSCOPY N/A 10/23/2024   Procedure: COLONOSCOPY;  Surgeon: Mavis Anes, MD;  Location: [facility];  Service: Gastroenterology;  Laterality: N/A;  W/ PROPOFOL    HAND SURGERY     MOUTH SURGERY  03/22/2023   tooth removed   OVARIAN CYST REMOVAL N/A 08/17/2007   SHOULDER ARTHROSCOPY  06/24/2022   TONSILLECTOMY      OB History     Gravida  2   Para  1   Term  1   Preterm      AB  1   Living  1      SAB      IAB  1   Ectopic      Multiple      Live Births  1            Home Medications    Prior to Admission medications  Medication Sig Start Date End Date Taking? Authorizing Provider  azelastine  (ASTELIN ) 0.1 % nasal spray Place  1 spray into both nostrils 2 (two) times daily. Use in each nostril as directed 11/16/24  Yes Stuart Vernell Norris, PA-C  promethazine -dextromethorphan (PROMETHAZINE -DM) 6.25-15 MG/5ML syrup Take 5 mLs by mouth 4 (four) times daily as needed. 11/16/24  Yes Stuart Vernell Norris, PA-C  amLODipine  (NORVASC ) 5 MG tablet TAKE 1 TABLET (5 MG TOTAL) BY MOUTH DAILY. 05/21/24   Signa Delon LABOR, NP  azelastine  (ASTELIN ) 0.1 % nasal spray Place 1 spray into both nostrils 2 (two) times daily. Use in each nostril as directed 09/09/24   Stuart Vernell Norris, PA-C  diclofenac  (VOLTAREN ) 75 MG EC tablet Take 1 tablet (75 mg total) by mouth 2 (two) times daily as needed. 03/04/23   Jule Ronal CROME, PA-C  folic acid  (FOLVITE ) 1 MG tablet TAKE 2  TABLETS BY MOUTH EVERY DAY 06/14/22   Cheryl Waddell HERO, PA-C  levonorgestrel  (MIRENA ) 20 MCG/24HR IUD 1 each by Intrauterine route once.    [provider]  MAGNESIUM  PO Take by mouth.    [provider]  meclizine  (ANTIVERT ) 25 MG tablet Take 1 tablet (25 mg total) by mouth 3 (three) times daily as needed for dizziness. 03/22/23   Signa Delon LABOR, NP  methotrexate  (RHEUMATREX) 2.5 MG tablet TAKE 7 TABLETS (17.5 MG TOTAL) BY MOUTH ONCE A WEEK. CAUTION:CHEMOTHERAPY. PROTECT FROM LIGHT. 04/28/23   Cheryl Waddell HERO, PA-C  promethazine -dextromethorphan (PROMETHAZINE -DM) 6.25-15 MG/5ML syrup Take 5 mLs by mouth 4 (four) times daily as needed. 09/09/24   Stuart Vernell Norris, PA-C  WEGOVY 0.25 MG/0.5ML SOAJ SQ injection Inject 0.25 mg into the skin once a week. 07/27/24   [provider]    Family History Family History  Problem Relation Age of Onset   Diabetes Mother    COPD Mother    Arthritis Mother    Hypertension Mother    Heart disease Mother    Congestive Heart Failure Mother    Hearing loss Father    Asthma Son    Cancer Maternal Grandmother        lung   Heart disease Paternal Grandmother        CHF    Social History Social History[1]   Allergies   Contrast media [iodinated contrast media]   Review of Systems Review of Systems Per HPI  Physical Exam Triage Vital Signs ED Triage Vitals  Encounter Vitals Group     BP 11/16/24 0851 128/85     Girls Systolic BP Percentile --      Girls Diastolic BP Percentile --      Boys Systolic BP Percentile --      Boys Diastolic BP Percentile --      Pulse Rate 11/16/24 0851 (!) 105     Resp 11/16/24 0851 18     Temp 11/16/24 0851 99.9 F (37.7 C)     Temp Source 11/16/24 0851 Oral     SpO2 11/16/24 0851 95 %     Weight --      Height --      Head Circumference --      Peak Flow --      Pain Score 11/16/24 0853 0     Pain Loc --      Pain Education --      Exclude from Growth Chart --    No data  found.  Updated Vital Signs BP 128/85 (BP Location: Right Arm)   Pulse (!) 105   Temp 99.9 F (37.7 C) (Oral)   Resp 18  LMP  (LMP Unknown)   SpO2 95%   Visual Acuity Right Eye Distance:   Left Eye Distance:   Bilateral Distance:    Right Eye Near:   Left Eye Near:    Bilateral Near:     Physical Exam Vitals and nursing note reviewed.  Constitutional:      Appearance: Normal appearance. She is not ill-appearing.  HENT:     Head: Atraumatic.     Right Ear: Tympanic membrane and external ear normal.     Left Ear: Tympanic membrane and external ear normal.     Nose: Rhinorrhea present.     Mouth/Throat:     Mouth: Mucous membranes are moist.     Pharynx: Posterior oropharyngeal erythema present.  Eyes:     Extraocular Movements: Extraocular movements intact.     Conjunctiva/sclera: Conjunctivae normal.  Cardiovascular:     Rate and Rhythm: Normal rate and regular rhythm.     Heart sounds: Normal heart sounds.  Pulmonary:     Effort: Pulmonary effort is normal.     Breath sounds: Normal breath sounds. No wheezing or rales.  Musculoskeletal:        General: Normal range of motion.     Cervical back: Normal range of motion and neck supple.  Skin:    General: Skin is warm and dry.  Neurological:     Mental Status: She is alert and oriented to person, place, and time.  Psychiatric:        Mood and Affect: Mood normal.        Thought Content: Thought content normal.        Judgment: Judgment normal.      UC Treatments / Results  Labs (all labs ordered are listed, but only abnormal results are displayed) Labs Reviewed  POC COVID19/FLU A&B COMBO  CERVICOVAGINAL ANCILLARY ONLY    EKG   Radiology No results found.  Procedures Procedures (including critical care time)  Medications Ordered in UC Medications - No data to display  Initial Impression / Assessment and Plan / UC Course  I have reviewed the triage vital signs and the nursing  notes.  Pertinent labs & imaging results that were available during my care of the patient were reviewed by me and considered in my medical decision making (see chart for details).     Vitals and exam overall reassuring, rapid flu and COVID-negative, suspect other viral respiratory infection causing her symptoms.  Will treat with Phenergan  DM, Astelin , supportive over-the-counter medications at home care.  Vaginal swab pending for further evaluation, will treat based on results.  Discussed supportive over-the-counter medications at home care in the meantime.  Return for worsening symptoms.  Final Clinical Impressions(s) / UC Diagnoses   Final diagnoses:  Viral URI with cough  Acute vaginitis     Discharge Instructions      We will let you know if anything comes back abnormal on your vaginal swab results.  We will treat based on these.  In the meantime, you may try boric acid vaginal suppositories daily as needed and a female health probiotic.  For your viral upper respiratory symptoms, I have sent in a cough syrup and nasal spray and you may use over-the-counter cold congestion medications as needed.  Return for worsening or unresolving symptoms.    ED Prescriptions     Medication Sig Dispense Auth. Provider   promethazine -dextromethorphan (PROMETHAZINE -DM) 6.25-15 MG/5ML syrup Take 5 mLs by mouth 4 (four) times daily as needed. 100 mL Stuart,  Vernell Norris, PA-C   azelastine  (ASTELIN ) 0.1 % nasal spray Place 1 spray into both nostrils 2 (two) times daily. Use in each nostril as directed 30 mL Stuart Vernell Norris, PA-C      PDMP not reviewed this encounter.    [1]  Social History Tobacco Use   Smoking status: Never    Passive exposure: Current   Smokeless tobacco: Never  Vaping Use   Vaping status: Never Used  Substance Use Topics   Alcohol use: Yes    Comment: 1 or 2 times per year   Drug use: Never     Stuart Vernell Norris, PA-C 11/16/24 [phone]  "

## 2024-11-16 NOTE — ED Triage Notes (Signed)
 Pt reports she has some nasal congestion, cough, body aches, and chest congestion x 2 days   Reports she is having vaginal odor after a colonoscopy x 3 weeks

## 2024-11-19 ENCOUNTER — Ambulatory Visit (HOSPITAL_COMMUNITY): Payer: Self-pay

## 2024-11-19 LAB — CERVICOVAGINAL ANCILLARY ONLY
Bacterial Vaginitis (gardnerella): POSITIVE — AB
Candida Glabrata: NEGATIVE
Candida Vaginitis: NEGATIVE
Comment: NEGATIVE
Comment: NEGATIVE
Comment: NEGATIVE

## 2024-11-19 MED ORDER — METRONIDAZOLE 500 MG PO TABS: 500.0000 mg | ORAL_TABLET | Freq: Two times a day (BID) | ORAL | 0 refills | Status: AC
